# Patient Record
Sex: Female | Born: 1950
Health system: Southern US, Community
[De-identification: ages and names within clinical notes are randomized; demographics above are authoritative.]

## PROBLEM LIST (undated history)

## (undated) DIAGNOSIS — B001 Herpesviral vesicular dermatitis: Secondary | ICD-10-CM

## (undated) DIAGNOSIS — C44310 Basal cell carcinoma of skin of unspecified parts of face: Secondary | ICD-10-CM

## (undated) DIAGNOSIS — C4492 Squamous cell carcinoma of skin, unspecified: Secondary | ICD-10-CM

## (undated) DIAGNOSIS — E785 Hyperlipidemia, unspecified: Secondary | ICD-10-CM

## (undated) DIAGNOSIS — M1990 Unspecified osteoarthritis, unspecified site: Secondary | ICD-10-CM

## (undated) DIAGNOSIS — C439 Malignant melanoma of skin, unspecified: Secondary | ICD-10-CM

## (undated) HISTORY — PX: APPENDECTOMY: SHX54

## (undated) HISTORY — DX: Squamous cell carcinoma of skin, unspecified: C44.92

## (undated) HISTORY — DX: Herpesviral vesicular dermatitis: B00.1

## (undated) HISTORY — DX: Basal cell carcinoma of skin of unspecified parts of face: C44.310

## (undated) HISTORY — DX: Hyperlipidemia, unspecified: E78.5

## (undated) HISTORY — PX: REFRACTIVE SURGERY: SHX103

---

## 2001-01-10 ENCOUNTER — Ambulatory Visit (HOSPITAL_COMMUNITY): Admission: RE | Admit: 2001-01-10 | Discharge: 2001-01-10 | Payer: Self-pay | Admitting: Gastroenterology

## 2002-02-24 ENCOUNTER — Emergency Department (HOSPITAL_COMMUNITY): Admission: EM | Admit: 2002-02-24 | Discharge: 2002-02-24 | Payer: Self-pay | Admitting: Emergency Medicine

## 2002-02-24 ENCOUNTER — Encounter: Payer: Self-pay | Admitting: Emergency Medicine

## 2002-04-29 ENCOUNTER — Encounter (INDEPENDENT_AMBULATORY_CARE_PROVIDER_SITE_OTHER): Payer: Self-pay | Admitting: Specialist

## 2002-04-30 ENCOUNTER — Inpatient Hospital Stay (HOSPITAL_COMMUNITY): Admission: EM | Admit: 2002-04-30 | Discharge: 2002-05-02 | Payer: Self-pay | Admitting: *Deleted

## 2002-05-01 ENCOUNTER — Encounter: Payer: Self-pay | Admitting: General Surgery

## 2003-03-17 ENCOUNTER — Other Ambulatory Visit: Admission: RE | Admit: 2003-03-17 | Discharge: 2003-03-17 | Payer: Self-pay | Admitting: Family Medicine

## 2003-07-07 ENCOUNTER — Encounter: Admission: RE | Admit: 2003-07-07 | Discharge: 2003-07-07 | Payer: Self-pay | Admitting: Family Medicine

## 2003-09-15 ENCOUNTER — Encounter: Admission: RE | Admit: 2003-09-15 | Discharge: 2003-09-15 | Payer: Self-pay | Admitting: Neurology

## 2003-09-15 ENCOUNTER — Other Ambulatory Visit: Admission: RE | Admit: 2003-09-15 | Discharge: 2003-09-15 | Payer: Self-pay | Admitting: Obstetrics and Gynecology

## 2003-10-12 ENCOUNTER — Encounter: Admission: RE | Admit: 2003-10-12 | Discharge: 2003-10-12 | Payer: Self-pay | Admitting: Neurosurgery

## 2004-11-19 ENCOUNTER — Emergency Department (HOSPITAL_COMMUNITY): Admission: EM | Admit: 2004-11-19 | Discharge: 2004-11-19 | Payer: Self-pay | Admitting: *Deleted

## 2005-05-16 ENCOUNTER — Other Ambulatory Visit: Admission: RE | Admit: 2005-05-16 | Discharge: 2005-05-16 | Payer: Self-pay | Admitting: Family Medicine

## 2005-06-27 HISTORY — PX: CARDIOVASCULAR STRESS TEST: SHX262

## 2006-04-26 ENCOUNTER — Ambulatory Visit: Payer: Self-pay | Admitting: Family Medicine

## 2006-05-09 ENCOUNTER — Ambulatory Visit (HOSPITAL_COMMUNITY): Admission: RE | Admit: 2006-05-09 | Discharge: 2006-05-09 | Payer: Self-pay | Admitting: Gastroenterology

## 2006-05-09 ENCOUNTER — Encounter (INDEPENDENT_AMBULATORY_CARE_PROVIDER_SITE_OTHER): Payer: Self-pay | Admitting: *Deleted

## 2006-05-09 LAB — HM COLONOSCOPY

## 2007-02-14 ENCOUNTER — Ambulatory Visit: Payer: Self-pay | Admitting: Family Medicine

## 2007-04-16 ENCOUNTER — Ambulatory Visit: Payer: Self-pay | Admitting: Family Medicine

## 2007-08-07 ENCOUNTER — Ambulatory Visit: Payer: Self-pay | Admitting: Family Medicine

## 2008-10-05 ENCOUNTER — Other Ambulatory Visit: Admission: RE | Admit: 2008-10-05 | Discharge: 2008-10-05 | Payer: Self-pay | Admitting: Family Medicine

## 2008-10-05 ENCOUNTER — Ambulatory Visit: Payer: Self-pay | Admitting: Family Medicine

## 2008-10-13 ENCOUNTER — Encounter: Admission: RE | Admit: 2008-10-13 | Discharge: 2008-10-13 | Payer: Self-pay | Admitting: Family Medicine

## 2008-10-13 LAB — HM MAMMOGRAPHY: HM Mammogram: NORMAL

## 2008-10-16 ENCOUNTER — Ambulatory Visit: Payer: Self-pay | Admitting: Family Medicine

## 2009-03-24 ENCOUNTER — Ambulatory Visit: Payer: Self-pay | Admitting: Family Medicine

## 2009-11-22 ENCOUNTER — Ambulatory Visit: Payer: Self-pay | Admitting: Family Medicine

## 2009-11-22 ENCOUNTER — Other Ambulatory Visit: Admission: RE | Admit: 2009-11-22 | Discharge: 2009-11-22 | Payer: Self-pay | Admitting: Family Medicine

## 2009-11-22 LAB — HM MAMMOGRAPHY: HM Mammogram: NEGATIVE

## 2010-03-20 ENCOUNTER — Encounter: Payer: Self-pay | Admitting: Family Medicine

## 2010-07-02 ENCOUNTER — Encounter: Payer: Self-pay | Admitting: Family Medicine

## 2010-07-02 DIAGNOSIS — T2101XA Burn of unspecified degree of chest wall, initial encounter: Secondary | ICD-10-CM | POA: Insufficient documentation

## 2010-07-02 DIAGNOSIS — M21611 Bunion of right foot: Secondary | ICD-10-CM

## 2010-07-15 NOTE — Op Note (Signed)
NAME:  Lauren [name], KLUGE                ACCOUNT NO.:  [id number]   MEDICAL RECORD NO.:  [id number]          PATIENT TYPE:  AMB   LOCATION:  ENDO                         FACILITY:  [facility]   PHYSICIAN:  Anselmo Rod, M.D.  DATE OF BIRTH:  14-Jun-1950   DATE OF PROCEDURE:  05/09/2006  DATE OF DISCHARGE:                               OPERATIVE REPORT   PROCEDURE PERFORMED:  Esophagogastroduodenoscopy with gastric biopsies.   ENDOSCOPIST:  Anselmo Rod, M.D.   INSTRUMENT USED:  Pentax video panendoscope.   INDICATIONS FOR PROCEDURE:  60 year old white female with a history of  reflux undergoing EGD. The patient has a family history of stomach  cancer in several family members. Rule out peptic ulcer disease,  esophagitis, gastritis, etc.   PRE-PROCEDURE PREPARATION:  Informed consent was procured from the  patient.  The patient fasted for four hours prior to the procedure. The  risks and benefits of the procedure including bleeding, etc., were  discussed with the patient in great detail.   PRE-PROCEDURE PHYSICAL:  The patient had stable vital signs.  The neck  was supple.  Lungs were clear to auscultation.  S1 and S2 regular.  Abdomen soft with normal bowel sounds.   DESCRIPTION OF PROCEDURE:  The patient was placed in the left lateral  decubitus position and sedated with 75 mcg of Fentanyl and 7.5 mg of  Versed given intravenously in slow incremental doses.  Once the patient  was adequately sedated and maintained on low flow oxygen and continuous  cardiac monitoring, the Pentax video panendoscope was advanced through  the mouthpiece over the tongue into the esophagus under direct vision.  The entire esophagus was widely patent with no evidence of ring,  stricture, mass, esophagitis, or Barrett's mucosa.  The scope was then  advanced in the stomach.  A small amount of old heme was noted in the  mid body of the stomach.  Biopsies were done to rule out the presence of  H. pylori by  pathology.  The proximal small bowel appeared normal.  There was no obstruction.  No ulcers, erosions, masses or polyps were  identified. Mild diffuse gastritis was noted as mentioned above.  The  patient tolerated the procedure well without complications.  Retroflexion in the high cardia revealed no abnormalities.   IMPRESSION:  1. Normal appearing esophagus.  2. Gastritis in the mid body of the stomach with old heme overlying      this area, biopsies done for H. pylori.  3. Normal proximal small bowel.   RECOMMENDATIONS:  1. Await pathology results.  2. Treat with antibiotics if H. pylori present on biopsies.  3. Follow antireflux measures.  4. PPI as needed.  5. Proceed with a colonoscopy at this time, further recommendations to      be made thereafter.      Anselmo Rod, M.D.  Electronically Signed     JNM/MEDQ  D:  05/10/2006  T:  05/12/2006  Job:  [id number]   cc:   Sharlot Gowda, M.D.

## 2010-07-15 NOTE — Op Note (Signed)
NAME:  Lauren [name], Lauren [name]                ACCOUNT NO.:  [id number]   MEDICAL RECORD NO.:  [id number]          PATIENT TYPE:  AMB   LOCATION:  ENDO                         FACILITY:  [facility]   PHYSICIAN:  Anselmo Rod, M.D.  DATE OF BIRTH:  Sep 29, 1950   DATE OF PROCEDURE:  05/09/2006  DATE OF DISCHARGE:  05/09/2006                               OPERATIVE REPORT   PROCEDURE PERFORMED:  Screening colonoscopy.   ENDOSCOPIST:  Anselmo Rod, M.D.   INSTRUMENT USED:  Pentax video colonoscope.   INDICATIONS:  This 60 year old female underwent a screening colonoscopy  to rule out colonic polyps, masses, etc.   PRE-PROCEDURE PREPARATION:  An informed consent was procured from the  patient. The patient was fasted for four hours prior to the procedure  and prepped with 20 of OsmoPrep overnight over 12 hours and prepped on  the morning of the procedure.  The risks and benefits of the procedure  including a 10% miss rate of cancer and polyps were discussed with the  patient as well.   PRE-PROCEDURE PHYSICAL EXAMINATION:  VITAL SIGNS:  The patient had  stable vital signs.  NECK:  Supple.  LUNGS:  The chest was clear to auscultation.  HEART:  S1 and S2 regular.  ABDOMEN:  The abdomen is soft with normal bowel sounds.   DESCRIPTION OF PROCEDURE:  The patient was placed in the left lateral  decubitus position and sedated with another 50 mcg of fentanyl and 5 mg  of Versed given intravenously in slow incremental doses. Once the  patient was adequately sedated and maintained on low-flow oxygen and  continuous cardiac monitoring, the Pentax video colonoscope was advanced  from the rectum to the cecum.  The appendiceal orifice and ileocecal  valve were clearly visualized and photographed. The terminal ileum  appeared healthy and without lesions.  No masses or polyps, erosions or  ulcerations were seen.  A few sigmoid diverticula were present.  Retroflexion in the rectum revealed no abnormalities.   The patient  tolerated the procedure well without complications.   IMPRESSION:  Normal colonoscopy of the terminal ileum except for a few  sigmoid diverticula.  No masses or polyps seen.   RECOMMENDATIONS:  1. Continue a high-fiber diet with liberal fluid intake.  2. Brochures on diverticulosis have been given to the patient for      education.  3. Repeat colonoscopy in the next 10 years.  If the patient has any      normal symptoms in the interim, she is to contact the office      immediately for further recommendations.      Anselmo Rod, M.D.  Electronically Signed     JNM/MEDQ  D:  05/10/2006  T:  05/12/2006  Job:  [id number]   cc:   Sharlot Gowda, M.D.

## 2010-07-15 NOTE — Op Note (Signed)
[facility]  Patient:    Lauren [name], Lauren [name] Visit Number: [id number] MRN: [id number]          Service Type: END Location: ENDO Attending Physician:  Charna Elizabeth Dictated by:   Anselmo Rod, M.D. Proc. Date: 01/10/01 Admit Date:  01/10/2001   CC:         Heather Roberts, M.D.   Operative Report  DATE OF BIRTH:  1950-11-11  REFERRING PHYSICIAN:  Heather Roberts, M.D.  PROCEDURE PERFORMED:  Flexible sigmoidoscopy up to the hepatic flexure.  ENDOSCOPIST:  Anselmo Rod, M.D.  INSTRUMENT USED:  Olympus video colonoscope.  INDICATIONS FOR PROCEDURE:  Screening flexible sigmoidoscopy being performed in a 60 year old white female, rule out colonic polyps.  PREPROCEDURE PREPARATION:  Informed consent was procured from the patient. The patient was fasted for eight hours prior to the procedure and prepped with a bottle of Fleets Phospho-Soda the night prior to the procedure.  PREPROCEDURE PHYSICAL:  The patient had stable vital signs.  Neck supple. Chest clear to auscultation.  S1, S2 regular.  Abdomen soft with normal abdominal bowel sounds.  DESCRIPTION OF PROCEDURE:  The patient was placed in the left lateral decubitus position.  Once the patient was adequately positioned, the Olympus video colonoscope was advanced from the rectum to 100 cm without difficulty. The entire colonic mucosa appeared healthy except for small internal and external hemorrhoid.  No other abnormalities were noted.  No diverticulosis or polyps were present.  IMPRESSION: 1. Healthy-appearing colon up to 100 cm. 2. Small nonbleeding internal and external hemorrhoids.  RECOMMENDATIONS:  Repeat colorectal screening is recommended in the next five years unless  the patient were to develop any abnormal symptoms in the interimDictated by:   Anselmo Rod, M.D. Attending Physician:  Charna Elizabeth DD:  01/10/01 TD:  01/10/01 Job: [id number]

## 2010-07-15 NOTE — Op Note (Signed)
NAME:  Lauren [name], Lauren [name] NO.:  [id number]   MEDICAL RECORD NO.:  [id number]                   PATIENT TYPE:  OBV   LOCATION:  5702                                 FACILITY:  [facility]   PHYSICIAN:  Anselm Pancoast. Zachery Dakins, M.D.          DATE OF BIRTH:  [birth year]   DATE OF PROCEDURE:  04/29/2002  DATE OF DISCHARGE:                                 OPERATIVE REPORT   PREOPERATIVE DIAGNOSIS:  Acute appendicitis.   POSTOPERATIVE DIAGNOSIS:  Acute appendicitis.   OPERATION:  Laparoscopic appendectomy.   ANESTHESIA:  General.   SURGEON:  Anselm Pancoast. Zachery Dakins, M.D.   ASSISTANT:  Nurse.   HISTORY:  The patient is a 60 year old female who has had a progressive,  about 48 hr, history of abdominal pain; originally kind of gaseous and  bloated in the upper abdomen.  The pain then shifted more to the right lower  quadrant over the past 12 hr.  She was originally seen by Dr. Luan Pulling at  the ER physcian's office with appendicitis.  Her white count is about  13,000.  Because of the family problems,  Dr. Luan Pulling asked if I would see her and manage her.   On physical examination I was definitely in agreement that there was  definite tenderness in  right lower quadrant, fever, symptoms are  progressive like appendicitis.  I felt that a laparoscopic appendectomy was  appropriate.   She has had two previous cesarean sections through a lower midline incision,  and hopefully will not have too many adhesions.  The patient had been given  2 g of Unasyn approximately two hours earlier.   DESCRIPTION OF PROCEDURE:  The patient was taken to the operative suite,  general anesthesia given with endotracheal tube and an oral tube into the  stomach, and then a sterile Foley catheter inserted.  The abdomen was  prepped with Betadine soapy scrub solution and draped in a sterile manner.   A small incision was made at the umbilicus, just above her lower midline  cesarean  section incisions; then carefully entered into the peritoneal  cavity.  The peritoneum was carefully opened.  Fortunately, she did not have  any adhesions to the undersurface.  The Hasson cannula was introduced after  a pursestring of 0 Vicryl.  The upper 5 mm trocar was placed first, and then  using the sector through the upper you could see there was obviously an  inflammatory process in the right lower quadrant.  However, I could not  really see the definite appendix.   I then went ahead and placed a 12 mm trocar in the left lower quadrant, and  then switched in the camera from the umbilical to the left lower quadrant.  I could flip up a very thick edematous omentum, and then identified a very  long acutely inflamed appendix.  I could grasp it with the upper and then  using  the harmonic scalpel freed up this thick, edematous omentum from the  appendix.  I then took a GIA and basically transected the appendix as it  junctioned with the cecum.   Next, using the harmonic scalpel, I went ahead and divided this very  thickened appendiceal mesentery.  Good hemostasis was obtained with the  harmonic scalpel.   There was a second little area of fatty tissue that looked frankly necrotic,  that had been right into the appendix.  I actually removed this also.   I placed the appendix in an Endo catch bag, brought it out through the  fascia at the umbilicus.  Next, we thoroughly irrigated and switched the  omentum back (basically when we had flipped it up) to the normal anatomical  position; then aspirated the remaining irrigating fluid.  The upper 5 mm  trocar was removed first, then the left lower quadrant, and then after  carbon dioxide released the umbilical fascia.  The umbilical fascia was  closed with two figure-of-eights of 0 Vicryl.  I closed the anterior fascia  in the left lower quadrant at the 12 mm trocar site.  I did not anesthetize  the incision, but 4-0 Vicryl subcuticular  sutures and then Benzoin and Steri-  Strips on the skin.   The patient tolerated the procedure nicely and hopefully will be ready to go  home in the a.m.  I am going to keep her on about two or three additional  courses of Unasyn because of the marked inflammation of the appendix, but it  had not truly ruptured.                                               Anselm Pancoast. Zachery Dakins, M.D.    WJW/MEDQ  D:  04/29/2002  T:  04/29/2002  Job:  [id number]

## 2010-07-15 NOTE — Discharge Summary (Signed)
NAME:  Lauren [name], Lauren [name] NO.:  [id number]   MEDICAL RECORD NO.:  [id number]                   PATIENT TYPE:  INP   LOCATION:  5709                                 FACILITY:  [facility]   PHYSICIAN:  Anselm Pancoast. Zachery Dakins, M.D.          DATE OF BIRTH:  March 13, 1950   DATE OF ADMISSION:  04/29/2002  DATE OF DISCHARGE:  05/02/2002                                 DISCHARGE SUMMARY   DISCHARGE DIAGNOSIS:  Acute appendicitis with localized peritonitis.   OPERATION:  Laparoscopic appendectomy.   HISTORY:  [name] is a 60 year old Caucasian female who was seen in  the emergency room (actually seen by Dr. Luan Pulling who was on call), and  the patient had a two-day history of progressive abdominal pain, originally  gaseous, not localized for the first 24 hours, and then became worse more in  the right lower quadrant.  She was nauseated, but no vomiting, and her  previous abdominal surgery had been two C-sections, with the last one  approximately 28 years ago.  On examination, she definitely had a  temperature of 101.9, pulse was elevated at 112, respirations 18, and blood  pressure 128/70, and she was definitely tender with rebound in the right  lower quadrant.  Dr. Luan Pulling was busy at that time and asked if I could  do her surgery, and I was in agreement with the diagnosis.  Her white count  was only about 14,000.  Urinalysis was negative.  We did not obtain a CT,  and she was taken to surgery.  First she was given 3 gm of Unasyn, and that  had been received about two hours prior to surgery.  After the surgery, she  was noted to have a markedly inflamed appendix with localized peritonitis.  The appendix was in kind of a midline position, and it was difficult to  truly mobilize.  I was freely able finally to completely free it up and  remove the appendix, and then thoroughly irrigated the lower abdomen and  aspirated this irrigated fluid.  The morning after  surgery the patient was  up walking.  We continued on the Unasyn, and I thought that she was possibly  ready for discharge, but later that afternoon she appeared to be more  tender.  Her white count had elevated to 18,000, and because of the  increasing pain and the difficulty that I had in removing the appendix with  the laparoscope, I questioned whether something could be wrong and obtained  a CT postoperatively.  This showed areas of inflammation in the lower  abdomen consistent with her appendicitis, but no evidence of any abscess or  other abnormality, and we continued with the antibiotics.  I think I did add  Flagyl, and the following morning she was definitely better.  The pain was  decreased.  A repeat white count that had been greater than 18,000 was now  9600, and her temperature was only a maximum of approximately 101 that day.  Her pulse was decreasing.  The following day her white count was down to  8800, and she was obviously improving.   She was ready for discharge on 07/02/02, which was the third postoperative  day, but is discharged on Augmentin 875 b.i.d. for four days, Vicodin for  pain, and will see me for a follow up exam in the office in approximately 3-  4 days.   I do not have a good explanation for the increasing in pain 48 hours after  surgery.  She did not appear to be having obviously atelectasis, and her  abdominal pain was consistent as if she was a little slow in clearing the  peritonitis from this markedly inflamed appendix.  Her appendix showed an  acute inflamed appendix; not an obvious rupture, but there was a significant  inflammation in the peritoneal cavity surrounding the appendix as if she was  developing a generalized peritonitis.                                               Anselm Pancoast. Zachery Dakins, M.D.    WJW/MEDQ  D:  07/02/2002  T:  07/03/2002  Job:  [id number]

## 2010-10-04 ENCOUNTER — Emergency Department (HOSPITAL_COMMUNITY)
Admission: EM | Admit: 2010-10-04 | Discharge: 2010-10-04 | Disposition: A | Discharge status: Home / Self Care | Payer: BC Managed Care – PPO | Attending: Emergency Medicine | Admitting: Emergency Medicine

## 2010-10-04 DIAGNOSIS — H5789 Other specified disorders of eye and adnexa: Secondary | ICD-10-CM | POA: Insufficient documentation

## 2010-10-04 DIAGNOSIS — H109 Unspecified conjunctivitis: Secondary | ICD-10-CM | POA: Insufficient documentation

## 2010-11-15 ENCOUNTER — Telehealth: Payer: Self-pay | Admitting: Family Medicine

## 2010-11-16 ENCOUNTER — Other Ambulatory Visit: Payer: Self-pay | Admitting: *Deleted

## 2010-11-16 MED ORDER — ATORVASTATIN CALCIUM 20 MG PO TABS: 20.0000 mg | ORAL_TABLET | Freq: Every day | ORAL | Status: DC

## 2010-11-22 NOTE — Telephone Encounter (Signed)
DONE

## 2010-11-24 ENCOUNTER — Other Ambulatory Visit (HOSPITAL_COMMUNITY)
Admission: RE | Admit: 2010-11-24 | Discharge: 2010-11-24 | Disposition: A | Discharge status: Home / Self Care | Payer: BC Managed Care – PPO | Source: Ambulatory Visit | Attending: Family Medicine | Admitting: Family Medicine

## 2010-11-24 ENCOUNTER — Telehealth: Payer: Self-pay | Admitting: *Deleted

## 2010-11-24 ENCOUNTER — Ambulatory Visit (INDEPENDENT_AMBULATORY_CARE_PROVIDER_SITE_OTHER): Payer: BC Managed Care – PPO | Admitting: Family Medicine

## 2010-11-24 ENCOUNTER — Encounter: Payer: Self-pay | Admitting: Family Medicine

## 2010-11-24 VITALS — BP 110/70 | HR 68 | Temp 98.1°F | Ht 61.0 in | Wt 163.0 lb

## 2010-11-24 DIAGNOSIS — Z0000 Encounter for general adult medical examination without abnormal findings: Secondary | ICD-10-CM

## 2010-11-24 DIAGNOSIS — Z01419 Encounter for gynecological examination (general) (routine) without abnormal findings: Secondary | ICD-10-CM | POA: Insufficient documentation

## 2010-11-24 DIAGNOSIS — B001 Herpesviral vesicular dermatitis: Secondary | ICD-10-CM

## 2010-11-24 DIAGNOSIS — B009 Herpesviral infection, unspecified: Secondary | ICD-10-CM

## 2010-11-24 DIAGNOSIS — Z79899 Other long term (current) drug therapy: Secondary | ICD-10-CM

## 2010-11-24 DIAGNOSIS — E7800 Pure hypercholesterolemia, unspecified: Secondary | ICD-10-CM

## 2010-11-24 DIAGNOSIS — Z23 Encounter for immunization: Secondary | ICD-10-CM

## 2010-11-24 DIAGNOSIS — Z2911 Encounter for prophylactic immunotherapy for respiratory syncytial virus (RSV): Secondary | ICD-10-CM

## 2010-11-24 LAB — COMPREHENSIVE METABOLIC PANEL
ALT: 14 U/L (ref 0–35)
AST: 10 U/L (ref 0–37)
Albumin: 4.4 g/dL (ref 3.5–5.2)
Alkaline Phosphatase: 64 U/L (ref 39–117)
BUN: 10 mg/dL (ref 6–23)
Chloride: 102 mEq/L (ref 96–112)
Creat: 0.74 mg/dL (ref 0.50–1.10)
Glucose, Bld: 87 mg/dL (ref 70–99)
Total Protein: 7.1 g/dL (ref 6.0–8.3)

## 2010-11-24 LAB — CBC WITH DIFFERENTIAL/PLATELET
Basophils Absolute: 0 10*3/uL (ref 0.0–0.1)
Basophils Relative: 0 % (ref 0–1)
Eosinophils Absolute: 0.2 10*3/uL (ref 0.0–0.7)
Eosinophils Relative: 3 % (ref 0–5)
MCH: 29 pg (ref 26.0–34.0)
MCV: 89.7 fL (ref 78.0–100.0)
Monocytes Absolute: 0.6 10*3/uL (ref 0.1–1.0)
Neutro Abs: 5 10*3/uL (ref 1.7–7.7)
Platelets: 328 10*3/uL (ref 150–400)
RBC: 4.76 MIL/uL (ref 3.87–5.11)

## 2010-11-24 LAB — POCT URINALYSIS DIPSTICK
Bilirubin, UA: NEGATIVE
Glucose, UA: NEGATIVE
Ketones, UA: NEGATIVE
Protein, UA: NEGATIVE
Spec Grav, UA: 1.005

## 2010-11-24 LAB — HM PAP SMEAR: HM Pap smear: NORMAL

## 2010-11-24 LAB — LIPID PANEL
LDL Cholesterol: 99 mg/dL (ref 0–99)
Total CHOL/HDL Ratio: 2.7 Ratio
Triglycerides: 76 mg/dL (ref <150)
VLDL: 15 mg/dL (ref 0–40)

## 2010-11-24 MED ORDER — ATORVASTATIN CALCIUM 20 MG PO TABS: 20.0000 mg | ORAL_TABLET | Freq: Every day | ORAL | Status: DC

## 2010-11-24 MED ORDER — VALACYCLOVIR HCL 1 G PO TABS: 2000.0000 mg | ORAL_TABLET | Freq: Two times a day (BID) | ORAL | Status: AC

## 2010-11-24 NOTE — Telephone Encounter (Signed)
Lauren [name] (seen this am for CPE) came back by around 11:30 am stating that she forgot to tell you about her head congestion. She says that she has had sever head congestion/HA for 2-3 days, no cough, no ST. Would like something called into [facility] Thanks.

## 2010-11-24 NOTE — Telephone Encounter (Signed)
Advise pt--no evidence of bacterial infection on exam.  I recommend use of decongestant (ie sudafed) and Mucinex if secretions are thick to loosen them up.  Sinus rinses or Neti-pot is also helpful for relieving sinus congestion.  These medications are all OTC

## 2010-11-24 NOTE — Progress Notes (Signed)
Lauren [name] is a 60 y.o. female who presents for a complete physical.  She has the following concerns: Notes decreased libido.  She thinks it is related to stress, especially with her current relationship. Gets cold sores periodically, especially when at the beach.  Previously has taken Valtrex and would like a prescription. Congestion--having some sinus headaches and congestion.  Denies fevers, purulence, cough; has had for a couple of days  Immunization History  Administered Date(s) Administered  . Pneumococcal Polysaccharide 10/05/2008  . Td 02/28/2000  . Tdap 10/05/2008   Last Pap smear: 10/2009 Last mammogram: 2010 Last colonoscopy: 3/08 Last DEXA: pt states she had one at [facility] in '-08 or '09.  No record in chart.  Normal per pt Dentist: twice yearly Ophtho: many years ago (>5) Exercise: minimal  Past Medical History  Diagnosis Date  . Hyperlipidemia   . Facial basal cell cancer     nose (Dr. Nicholas Lose)    Past Surgical History  Procedure Date  . Cardiovascular stress test 06-2005    Dr. Clarene Duke  . Cesarean section x2  . Appendectomy     History   Social History  . Marital Status: Divorced    Spouse Name: N/A    Number of Children: 2  . Years of Education: N/A   Occupational History  . used [employer]    Social History Main Topics  . Smoking status: Never Smoker   . Smokeless tobacco: Never Used  . Alcohol Use: Yes     1 drink every 6 months.  . Drug Use: No  . Sexually Active: Yes -- Female partner(s)   Other Topics Concern  . Not on file   Social History Narrative   Lives with fiance, yorkie. Both sons live in [place]    Family History  Problem Relation Age of Onset  . Heart disease Mother 49    MI  . Heart disease Sister   . Heart disease Sister   . Hyperlipidemia Sister   . Hypertension Sister   . Diabetes Sister   . Graves' disease Sister   . COPD Sister   . Heart disease Brother     81's  . Heart disease Brother   . Diabetes Brother    borderline  . COPD Brother     Current outpatient prescriptions:aspirin 81 MG tablet, Take 81 mg by mouth daily.  , Disp: , Rfl: ;  atorvastatin (LIPITOR) 20 MG tablet, Take 1 tablet (20 mg total) by mouth daily., Disp: 90 tablet, Rfl: 1;  DISCONTD: atorvastatin (LIPITOR) 20 MG tablet, Take 1 tablet (20 mg total) by mouth daily., Disp: 30 tablet, Rfl: 0 valACYclovir (VALTREX) 1000 MG tablet, Take 2 tablets (2,000 mg total) by mouth 2 (two) times daily. For just one day (4 tablets per episode), Disp: 20 tablet, Rfl: 0  No Known Allergies  ROS: The patient denies anorexia, fever, weight changes, headaches,  vision changes, decreased hearing, ear pain, sore throat, breast concerns, chest pain, palpitations, dizziness, syncope, dyspnea on exertion, cough, swelling, nausea, vomiting, diarrhea, constipation, abdominal pain, melena, hematochezia, indigestion/heartburn, hematuria, incontinence, dysuria, vaginal bleeding, discharge, odor or itch, genital lesions, joint pains, numbness, tingling, weakness, tremor, suspicious skin lesions, depression, anxiety, abnormal bleeding/bruising, or enlarged lymph nodes. +sinus headache and congestion for a few days, + decreased libido  PHYSICAL EXAM: BP 110/70  Pulse 68  Temp(Src) 98.1 F (36.7 C) (Oral)  Ht 5\' 1"  (1.549 m)  Wt 163 lb (73.936 kg)  BMI 30.80 kg/m2  General Appearance:  Alert, cooperative, no distress, appears stated age  Head:    Normocephalic, without obvious abnormality, atraumatic  Eyes:    PERRL, conjunctiva/corneas clear, EOM's intact, fundi    benign  Ears:    Normal TM's and external ear canals  Nose:   Nares normal, mucosa normal, no drainage or sinus   tenderness  Throat:   Lips, mucosa, and tongue normal; teeth and gums normal  Neck:   Supple, no lymphadenopathy;  thyroid:  no   enlargement/tenderness/nodules; no carotid   bruit or JVD  Back:    Spine nontender, no curvature, ROM normal, no CVA     tenderness  Lungs:     Clear  to auscultation bilaterally without wheezes, rales or     ronchi; respirations unlabored  Chest Wall:    No tenderness or deformity   Heart:    Regular rate and rhythm, S1 and S2 normal, no murmur, rub   or gallop  Breast Exam:    No tenderness, masses, or nipple discharge or inversion.      No axillary lymphadenopathy  Abdomen:     Soft, non-tender, nondistended, normoactive bowel sounds,    no masses, no hepatosplenomegaly  Genitalia:    Normal external genitalia without lesions.  BUS and vagina normal; cervix without lesions, or cervical motion tenderness. No abnormal vaginal discharge.  Uterus and adnexa not enlarged, nontender, no masses.  Pap performed  Rectal:    Normal tone, no masses or tenderness; guaiac negative stool  Extremities:   No clubbing, cyanosis or edema  Pulses:   2+ and symmetric all extremities  Skin:   Skin color, texture, turgor normal, no rashes or lesions. AK R shin  Lymph nodes:   Cervical, supraclavicular, and axillary nodes normal  Neurologic:   CNII-XII intact, normal strength, sensation and gait; reflexes 2+ and symmetric throughout          Psych:   Normal mood, affect, hygiene and grooming.    ASSESSMENT/PLAN: 1. Routine general medical examination at a health care facility  POCT Urinalysis Dipstick, Visual acuity screening, Vitamin D 25 hydroxy  2. Pure hypercholesterolemia  Lipid panel, atorvastatin (LIPITOR) 20 MG tablet  3. Encounter for long-term (current) use of other medications  Comprehensive metabolic panel, CBC with Differential  4. Need for zoster vaccination  Varicella-zoster vaccine subcutaneous  5. Need for influenza vaccination  Flu vaccine greater than or equal to 3yo preservative free IM  6. Herpes labialis  valACYclovir (VALTREX) 1000 MG tablet   F/u with dermatologist to have AK treated RLE and routine f/u (sees Dr. Nicholas Lose)  Discussed monthly self breast exams and yearly mammograms after the age of 47; at least 30 minutes of aerobic  activity at least 5 days/week; proper sunscreen use reviewed; healthy diet, including goals of calcium and vitamin D intake and alcohol recommendations (less than or equal to 1 drink/day) reviewed; regular seatbelt use; changing batteries in smoke detectors.  Immunization recommendations discussed--Zostavax and flu shot given today.  Colonoscopy recommendations reviewed--UTD

## 2010-11-24 NOTE — Patient Instructions (Addendum)
HEALTH MAINTENANCE RECOMMENDATIONS:  It is recommended that you get at least 30 minutes of aerobic exercise at least 5 days/week (for weight loss, you may need as much as 60-90 minutes). This can be any activity that gets your heart rate up. This can be divided in 10-15 minute intervals if needed, but try and build up your endurance at least once a week.  Weight bearing exercise is also recommended twice weekly.  Eat a healthy diet with lots of vegetables, fruits and fiber.  "Colorful" foods have a lot of vitamins (ie green vegetables, tomatoes, red peppers, etc).  Limit sweet tea, regular sodas and alcoholic beverages, all of which has a lot of calories and sugar.  Up to 1 alcoholic drink daily may be beneficial for women (unless trying to lose weight, watch sugars).  Drink a lot of water.  Calcium recommendations are 1200-1500 mg daily (1500 mg for postmenopausal women or women without ovaries), and vitamin D 1000 IU daily.  This should be obtained from diet and/or supplements (vitamins), and calcium should not be taken all at once, but in divided doses.  Monthly self breast exams and yearly mammograms for women over the age of 80 is recommended.  Sunscreen of at least SPF 30 should be used on all sun-exposed parts of the skin when outside between the hours of 10 am and 4 pm (not just when at beach or pool, but even with exercise, golf, tennis, and yard work!)  Use a sunscreen that says "broad spectrum" so it covers both UVA and UVB rays, and make sure to reapply every 1-2 hours.  Remember to change the batteries in your smoke detectors when changing your clock times in the spring and fall.  Use your seat belt every time you are in a car, and please drive safely and not be distracted with cell phones and texting while driving.  Please call to schedule a routine eye exam. Please take your Lipitor every day, and follow a low cholesterol diet

## 2010-11-25 ENCOUNTER — Encounter: Payer: Self-pay | Admitting: Family Medicine

## 2010-11-25 ENCOUNTER — Telehealth: Payer: Self-pay | Admitting: Family Medicine

## 2010-11-25 DIAGNOSIS — Z0000 Encounter for general adult medical examination without abnormal findings: Secondary | ICD-10-CM

## 2010-11-25 LAB — VITAMIN D 25 HYDROXY (VIT D DEFICIENCY, FRACTURES): Vit D, 25-Hydroxy: 55 ng/mL (ref 30–89)

## 2010-11-25 NOTE — Telephone Encounter (Signed)
done

## 2010-12-02 NOTE — Progress Notes (Signed)
Addended by: Lavell Islam on: 12/02/2010 01:58 PM   Modules accepted: Orders

## 2010-12-08 ENCOUNTER — Encounter: Payer: Self-pay | Admitting: Family Medicine

## 2010-12-23 ENCOUNTER — Encounter: Payer: Self-pay | Admitting: Family Medicine

## 2011-01-27 ENCOUNTER — Other Ambulatory Visit: Payer: Self-pay | Admitting: Dermatology

## 2011-04-07 ENCOUNTER — Other Ambulatory Visit: Payer: Self-pay | Admitting: Dermatology

## 2011-12-24 ENCOUNTER — Other Ambulatory Visit: Payer: Self-pay | Admitting: Family Medicine

## 2012-02-09 ENCOUNTER — Other Ambulatory Visit: Payer: Self-pay | Admitting: Dermatology

## 2012-09-10 ENCOUNTER — Encounter: Payer: Self-pay | Admitting: Internal Medicine

## 2012-09-10 LAB — HM MAMMOGRAPHY

## 2013-01-19 ENCOUNTER — Other Ambulatory Visit: Payer: Self-pay | Admitting: Family Medicine

## 2013-01-22 ENCOUNTER — Other Ambulatory Visit: Payer: Self-pay | Admitting: Family Medicine

## 2013-07-07 ENCOUNTER — Encounter: Payer: Self-pay | Admitting: Family Medicine

## 2013-07-07 ENCOUNTER — Ambulatory Visit
Admission: RE | Admit: 2013-07-07 | Discharge: 2013-07-07 | Disposition: A | Discharge status: Home / Self Care | Payer: BC Managed Care – PPO | Source: Ambulatory Visit | Attending: Family Medicine | Admitting: Family Medicine

## 2013-07-07 ENCOUNTER — Ambulatory Visit (INDEPENDENT_AMBULATORY_CARE_PROVIDER_SITE_OTHER): Payer: BC Managed Care – PPO | Admitting: Family Medicine

## 2013-07-07 VITALS — BP 110/72 | HR 80 | Temp 98.7°F | Ht 60.0 in | Wt 177.0 lb

## 2013-07-07 DIAGNOSIS — M25561 Pain in right knee: Secondary | ICD-10-CM

## 2013-07-07 DIAGNOSIS — B001 Herpesviral vesicular dermatitis: Secondary | ICD-10-CM

## 2013-07-07 DIAGNOSIS — E7800 Pure hypercholesterolemia, unspecified: Secondary | ICD-10-CM

## 2013-07-07 DIAGNOSIS — B009 Herpesviral infection, unspecified: Secondary | ICD-10-CM

## 2013-07-07 DIAGNOSIS — M25569 Pain in unspecified knee: Secondary | ICD-10-CM

## 2013-07-07 MED ORDER — NAPROXEN 500 MG PO TABS: 500.0000 mg | ORAL_TABLET | Freq: Two times a day (BID) | ORAL | Status: DC

## 2013-07-07 MED ORDER — VALACYCLOVIR HCL 500 MG PO TABS: ORAL_TABLET | ORAL | Status: DC

## 2013-07-07 NOTE — Progress Notes (Signed)
Chief Complaint  Patient presents with  . Knee Pain    has been having right knee pain x 1 month. Did fall down front steps about a month ago. Also if you have time would like something for her sinuses. Has been taking zyrtec wthout any relief, also tried claritin-D. Has facial pain and pressure and sinus HA x 3 weeks-has been doing lots of outside work.    Complains of right knee pain for over a month, probably closer to 3 months.  Pain started after she fell down 4 stone steps.  She landed flat on both knees.  The skin on the right knee was cut (didn't require stitches).  She broke a toe on her left foot at the same time. She swelled R>L.  Swelling has improved.  She has sharp medial knee pain if she twists, sometimes feels like it will buckle.  The pain radiates up to the medial right thigh when she twists like that.  She has taken ibuprofen, but sporadically, not regularly.  No side effects.  She hasn't been seen here since 2012.  She hasn't taken lipid-lowering meds in years.  She is asking for a refill of Valtrex--she used to take it daily to prevent frequent cold sores, but hasn't been taking it since she ran out, because she hadn't been seen.  She is also complaining of allergies--runny nose, sniffling, and some sinus pressure, not improved with OTC zyrtec. Denies purulence, fevers.  Past Medical History  Diagnosis Date  . Hyperlipidemia   . Facial basal cell cancer     nose (Dr. Ubaldo Glassing)   Past Surgical History  Procedure Laterality Date  . Cardiovascular stress test  06-2005    Dr. Rex Kras  . Cesarean section  x2  . Appendectomy     History   Social History  . Marital Status: Divorced    Spouse Name: N/A    Number of Children: 2  . Years of Education: N/A   Occupational History  . used Teacher, early years/pre    Social History Main Topics  . Smoking status: Never Smoker   . Smokeless tobacco: Never Used  . Alcohol Use: Yes     Comment: 1 drink every 6 months.  . Drug Use: No  .  Sexual Activity: Yes    Partners: Male   Other Topics Concern  . Not on file   Social History Narrative   Lives alone, [place]. Both sons live in [facility]   Outpatient Encounter Prescriptions as of 07/07/2013  Medication Sig Note  . aspirin 81 MG tablet Take 81 mg by mouth daily.   07/07/2013: Takes it most days  . cetirizine (ZYRTEC) 10 MG tablet Take 10 mg by mouth daily.   . valACYclovir (VALTREX) 500 MG tablet TAKE 1 TABLET EVERY DAY 07/07/2013: Uses prn fever blisters  . [DISCONTINUED] atorvastatin (LIPITOR) 20 MG tablet Take 1 tablet (20 mg total) by mouth daily. 07/07/2013: She hasn't taken in a couple of years, made her bones aches   Today took a BC powder and zyrtec.  No Known Allergies  ROS:  Denies fevers, chills, cough, shortness of breath, sore throat, chest pain, palpitations.  Denies nausea, vomiting, abdominal pain, bowel changes.  +R knee pain. No bleeding/bruising/rash or other complaints except as noted in HPI  PHYSICAL EXAM:  BP 110/72  Pulse 80  Temp(Src) 98.7 F (37.1 C) (Oral)  Ht 5' (1.524 m)  Wt 177 lb (80.287 kg)  BMI 34.57 kg/m2 Well developed, pleasant female in no  distress.  She is sniffling frequently throughout the visit Lower extremities:  FROM of knees without crepitus.  L knee exam is completely normal.  R knee--her pain is medial, and she has some mild scattered tenderness medially. No soft tissue swelling, warmth or effusion.  Pain radiates up medial thigh with twisting. Negative lachman, mcMurray, flick test. No pain with valgus/varus stress.  No effusion  ASSESSMENT/PLAN:  Right knee pain - suspect MCL strain; no internal derangement.  can't r/o arthritis.  given trauma, check x-ray - Plan: naproxen (NAPROSYN) 500 MG tablet, DG Knee Complete 4 Views Right  Pure hypercholesterolemia - not on meds.  past due for fasting lipids  Herpes labialis - refill valtrex just x 1 month, until she can get back for med check to discuss in more detail.   Take  the naproxen twice daily with food until completely resolved (or for the full 2 weeks) Follow up in 2 weeks if ongoing knee pain. Go to [facility] today for x-ray.  Zyrtec, decongestant such as sudafed if needed. Consider mucinex if mucus is thick, causing cough or chest congestion.   Return in 2 weeks, fasting, for follow-up and med check

## 2013-07-07 NOTE — Patient Instructions (Signed)
Take the naproxen twice daily with food until completely resolved (or for the full 2 weeks) Follow up in 2 weeks if ongoing knee pain. Go to [facility] today for x-ray.   Zyrtec, decongestant such as sudafed if needed. Consider mucinex if mucus is thick, causing cough or chest congestion.   Return in 2 weeks, fasting, for follow-up and med check

## 2013-07-14 ENCOUNTER — Ambulatory Visit (INDEPENDENT_AMBULATORY_CARE_PROVIDER_SITE_OTHER): Payer: BC Managed Care – PPO | Admitting: Family Medicine

## 2013-07-14 DIAGNOSIS — Z5320 Procedure and treatment not carried out because of patient's decision for unspecified reasons: Secondary | ICD-10-CM

## 2013-07-15 NOTE — Progress Notes (Signed)
Patient ended up cancelling her appointment; she was not seen today

## 2013-07-19 ENCOUNTER — Other Ambulatory Visit: Payer: Self-pay | Admitting: Family Medicine

## 2013-07-22 NOTE — Telephone Encounter (Signed)
Patient is aware that she will need to follow up with Dr. Tomi Bamberger in order to receive refills on the medication. CLS

## 2013-07-22 NOTE — Telephone Encounter (Signed)
Done

## 2013-07-22 NOTE — Telephone Encounter (Signed)
No.  She was to follow up in 2 weeks if she had ongoing knee pain.  She can use OTC Aleve in the interim.  (She had scheduled a visit 1 week after she was seen, for a cortisone shot, but left/canceled her appt prior to being seen)

## 2013-07-22 NOTE — Telephone Encounter (Signed)
Is this okay to refill? 

## 2013-07-28 ENCOUNTER — Ambulatory Visit (INDEPENDENT_AMBULATORY_CARE_PROVIDER_SITE_OTHER): Payer: BC Managed Care – PPO | Admitting: Family Medicine

## 2013-07-28 ENCOUNTER — Other Ambulatory Visit: Payer: Self-pay | Admitting: Family Medicine

## 2013-07-28 ENCOUNTER — Encounter: Payer: Self-pay | Admitting: Family Medicine

## 2013-07-28 VITALS — BP 108/70 | HR 84 | Ht 60.0 in | Wt 177.0 lb

## 2013-07-28 DIAGNOSIS — M7061 Trochanteric bursitis, right hip: Secondary | ICD-10-CM

## 2013-07-28 DIAGNOSIS — M25561 Pain in right knee: Secondary | ICD-10-CM

## 2013-07-28 DIAGNOSIS — B001 Herpesviral vesicular dermatitis: Secondary | ICD-10-CM

## 2013-07-28 DIAGNOSIS — B009 Herpesviral infection, unspecified: Secondary | ICD-10-CM

## 2013-07-28 DIAGNOSIS — M76899 Other specified enthesopathies of unspecified lower limb, excluding foot: Secondary | ICD-10-CM

## 2013-07-28 DIAGNOSIS — E7800 Pure hypercholesterolemia, unspecified: Secondary | ICD-10-CM

## 2013-07-28 DIAGNOSIS — Z0000 Encounter for general adult medical examination without abnormal findings: Secondary | ICD-10-CM

## 2013-07-28 DIAGNOSIS — M25569 Pain in unspecified knee: Secondary | ICD-10-CM

## 2013-07-28 DIAGNOSIS — Z79899 Other long term (current) drug therapy: Secondary | ICD-10-CM

## 2013-07-28 DIAGNOSIS — Z8249 Family history of ischemic heart disease and other diseases of the circulatory system: Secondary | ICD-10-CM

## 2013-07-28 LAB — CBC WITH DIFFERENTIAL/PLATELET
BASOS PCT: 1 % (ref 0–1)
Basophils Absolute: 0 10*3/uL (ref 0.0–0.1)
EOS ABS: 0.1 10*3/uL (ref 0.0–0.7)
EOS PCT: 2 % (ref 0–5)
HEMATOCRIT: 37.6 % (ref 36.0–46.0)
Hemoglobin: 12.9 g/dL (ref 12.0–15.0)
LYMPHS ABS: 1.1 10*3/uL (ref 0.7–4.0)
Lymphocytes Relative: 27 % (ref 12–46)
MCH: 29.9 pg (ref 26.0–34.0)
MCHC: 34.3 g/dL (ref 30.0–36.0)
MCV: 87.2 fL (ref 78.0–100.0)
Monocytes Absolute: 0.3 10*3/uL (ref 0.1–1.0)
Monocytes Relative: 7 % (ref 3–12)
NEUTROS PCT: 63 % (ref 43–77)
Neutro Abs: 2.5 10*3/uL (ref 1.7–7.7)
PLATELETS: 289 10*3/uL (ref 150–400)
RBC: 4.31 MIL/uL (ref 3.87–5.11)
RDW: 13.7 % (ref 11.5–15.5)
WBC: 3.9 10*3/uL — AB (ref 4.0–10.5)

## 2013-07-28 LAB — COMPREHENSIVE METABOLIC PANEL
ALBUMIN: 4.3 g/dL (ref 3.5–5.2)
ALT: 13 U/L (ref 0–35)
AST: 9 U/L (ref 0–37)
Alkaline Phosphatase: 50 U/L (ref 39–117)
BUN: 11 mg/dL (ref 6–23)
CO2: 24 mEq/L (ref 19–32)
Calcium: 9.4 mg/dL (ref 8.4–10.5)
Chloride: 103 mEq/L (ref 96–112)
Creat: 0.65 mg/dL (ref 0.50–1.10)
Glucose, Bld: 75 mg/dL (ref 70–99)
POTASSIUM: 4.5 meq/L (ref 3.5–5.3)
SODIUM: 137 meq/L (ref 135–145)
Total Bilirubin: 0.4 mg/dL (ref 0.2–1.2)
Total Protein: 6.7 g/dL (ref 6.0–8.3)

## 2013-07-28 NOTE — Progress Notes (Signed)
Chief Complaint  Patient presents with  . Med check    fasting med check. Would also like to have a cortisone injection in her right knee.    Hyperlipidemia/family history of CAD:  She had been on Crestor and Lipitor in the past, stating that she only took each of them for about 2-3 weeks.  Crestor made her dizzy and lightheaded.  Lipitor was "too strong", so dose was lowered.  She couldn't tolerate even the lower dose due to muscles and bones aching.  She had been seeing Dr. Rex Kras, had normal stress tests.  She last saw him about 5 years ago. She had a dye scan and that everything looked okay, was being put on statin preventatively.  (paper chart not available at time of visit).  She tries to follow a low cholesterol diet.  She denies having any chest pain, dyspnea, edema or palpitations.  Lab Results  Component Value Date   CHOL 182 11/24/2010   HDL 68 11/24/2010   LDLCALC 99 11/24/2010   TRIG 76 11/24/2010   CHOLHDL 2.7 11/24/2010   She doesn't recall if she was taking any meds at this time, but didn't take anything for very long.  Right knee pain--pain is "everywhere"--sometimes laterally, sometimes throughout the whole knee. With certain turning positions (feet planted and twisting to the left), she has pain that radiates up the thigh, to the groin.  She is taking the naproxen twice daily with some improvement.  She denies any abdominal pain, bleeding/bruising.  Denies any giving way, doesn't feel unstable with walking.  Denies any significant knee swelling.  She is also having pain in her right hip; can't lie on her right side at night in the bed because it is painful. She has pain that radiates down the side of the leg that she calls "sciatica" and has apparently been fairly chronic.  Herpes labialis--if she is compliant with daily meds, doesn't have breakthrough. If she doesn't take meds, gets very severe outbreaks, with any illness or stress.  Past Medical History  Diagnosis Date  .  Hyperlipidemia   . Facial basal cell cancer     nose (Dr. Ubaldo Glassing), leg  . Squamous cell skin cancer     many sites (hands/legs)  . Herpes simplex labialis    Past Surgical History  Procedure Laterality Date  . Cardiovascular stress test  06-2005    Dr. Rex Kras  . Cesarean section  x2  . Appendectomy     History   Social History  . Marital Status: Divorced    Spouse Name: N/A    Number of Children: 2  . Years of Education: N/A   Occupational History  . used Teacher, early years/pre    Social History Main Topics  . Smoking status: Never Smoker   . Smokeless tobacco: Never Used  . Alcohol Use: Yes     Comment: 1 drink every 6 months.  . Drug Use: No  . Sexual Activity: Not Currently    Partners: Male   Other Topics Concern  . Not on file   Social History Narrative   Lives alone, [place]. Both sons live in [place]   Family History  Problem Relation Age of Onset  . Heart disease Mother 7    MI  . COPD Mother   . Hyperlipidemia Mother   . Hypertension Mother   . Heart disease Sister   . Heart disease Sister   . Hyperlipidemia Sister   . Hypertension Sister   . Diabetes Sister   .  Graves' disease Sister   . COPD Sister   . Heart disease Brother     57's  . Heart disease Brother   . COPD Brother   . Alcohol abuse Brother   . Diabetes Brother     borderline  . COPD Brother   . Alcohol abuse Father   . Diabetes Sister   . Heart disease Sister     CAD, s/p angioplasties  . Diabetes Sister   . Hyperlipidemia Sister   . Hypertension Sister   . Gout Sister   . Arthritis Sister    Current Outpatient Prescriptions on File Prior to Visit  Medication Sig Dispense Refill  . aspirin 81 MG tablet Take 81 mg by mouth daily.        . naproxen (NAPROSYN) 500 MG tablet Take 1 tablet (500 mg total) by mouth 2 (two) times daily with a meal.  30 tablet  0  . valACYclovir (VALTREX) 500 MG tablet TAKE 1 TABLET EVERY DAY  30 tablet  0  . cetirizine (ZYRTEC) 10 MG tablet Take 10 mg by mouth  daily.       No current facility-administered medications on file prior to visit.   No Known Allergies  ROS:  Denies fevers, chills, headaches, dizziness, chest pain, palpitations, shortness of breath, edema, bleeding, bruising, rashes, nausea, vomiting, bowel changes, URI symptoms, cough or other concerns.  Just the knee and hip pain per HPI  PHYSICAL EXAM: BP 108/70  Pulse 84  Ht 5' (1.524 m)  Wt 177 lb (80.287 kg)  BMI 34.57 kg/m2 Pleasant female, overweight, in no distress Neck: no lymphadenopathy, thyromegaly or carotid bruit Heart: regular rate and rhythm, no murmur Lungs: clear bilaterally Back: no CVA tenderness Abdomen: soft, nontender, no organomegaly or mass Extremities: Tender at R trochanteric bursa, and also along the IT Band.  Discomfort with IT band stretching. Some medial knee pain with varus stress.  No clicks, endpoints intact--normal lachman, drawer.  No joint line tenderness. No effusion or warmth Skin: no rashes or lesions noted Psych: normal mood, affect, hygiene and grooming Neuro: alert and oriented.  Cranial nerves intact, normal strength, gait.  X-ray R knee from 07/07/13 IMPRESSION:  1. 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  2. Chondrocalcinosis of the medial compartment. Question CPPD.  ASSESSMENT/PLAN:  Trochanteric bursitis of right hip - continue NSAIDs; return for cortisone injection if not resolving  Right knee pain - component of degenerative changes, as well as ITB.  shown stretches.  consider referral to ortho if worsening.  Tylenol vs NSAIDs discussed (risks/benefits)  Family history of early CAD - Plan: High sensitivity CRP, Cardio IQ (R) Advanced Lipid Panel, NMR Lipoprofile without Lipids  Pure hypercholesterolemia - check particle sizes, hs-CRP.  pull old records upon review of results, to determine if any treatment is indicated. Continue 97m ASA. - Plan: Cardio IQ (R) Advanced Lipid  Panel, NMR Lipoprofile without Lipids  Routine general medical examination at a health care facility - Plan: Comprehensive metabolic panel, CBC with Differential, TSH  Encounter for long-term (current) use of other medications - Plan: Comprehensive metabolic panel, CBC with Differential  Herpes labialis - controlled with daily meds  Stretches shown, do 2x day Try and back off and use Tylenol Arthritis for pain, and use Naproxen more sparingly, not BID long-term.  Consider return for cortisone injection to R hip and/or knee  c-met, lipids and particle size, hs-CRP, CBC, TSH  F/u for CPE as scheduled 8/31  Return  sooner for cortisone shot for hip bursitis, if desired 

## 2013-07-29 LAB — NMR LIPOPROFILE WITHOUT LIPIDS
HDL Particle Number: 38.2 umol/L (ref >=30.5)
HDL SIZE: 9.1 nm — AB (ref >=9.2)
LDL Particle Number: 1832 nmol/L — ABNORMAL HIGH (ref <1000)
LDL Size: 21.5 nm (ref >20.5)
LP-IR SCORE: 37 (ref <=45)
Large HDL-P: 7.4 umol/L (ref >=4.8)
Large VLDL-P: 3.7 nmol/L — ABNORMAL HIGH (ref <=2.7)
Small LDL Particle Number: 587 nmol/L — ABNORMAL HIGH (ref <=527)
VLDL SIZE: 46 nm (ref <=46.6)

## 2013-07-29 LAB — HIGH SENSITIVITY CRP: CRP HIGH SENSITIVITY: 1.1 mg/L

## 2013-07-29 LAB — TSH: TSH: 0.941 u[IU]/mL (ref 0.350–4.500)

## 2013-07-31 ENCOUNTER — Telehealth: Payer: Self-pay | Admitting: Family Medicine

## 2013-07-31 ENCOUNTER — Telehealth: Payer: Self-pay | Admitting: *Deleted

## 2013-07-31 LAB — LIPID PANEL
CHOLESTEROL: 282 mg/dL — AB (ref 0–200)
HDL: 62 mg/dL (ref >39)
LDL Cholesterol: 194 mg/dL — ABNORMAL HIGH (ref 0–99)
TRIGLYCERIDES: 131 mg/dL (ref <150)
Total CHOL/HDL Ratio: 4.5 Ratio
VLDL: 26 mg/dL (ref 0–40)

## 2013-07-31 NOTE — Telephone Encounter (Signed)
Lauren [name] called the lab and they state that they do not have enough serum to perform the IQ OR the lipid panel that is why the tests were canceled. Lauren [name] said she sent 4 SST tubes and there should have been enough.

## 2013-07-31 NOTE — Telephone Encounter (Signed)
I called [phone] regarding need for lipids to be added and advised her 4 extra tubes were drawn.  And we do not want to have to call pt again for lab draw.  Shirlean Mylar will call back.

## 2013-07-31 NOTE — Telephone Encounter (Signed)
Beverlee Nims is calling them to discuss.

## 2013-07-31 NOTE — Telephone Encounter (Signed)
I just want to make sure this is done in a timely manner since I'm not here tomorrow, and don't want it to wait until next week (when for sure she will need to be redrawn).  Thanks for looking into this!

## 2013-07-31 NOTE — Telephone Encounter (Signed)
That is ridiculous.  Where are the other SST tubes? Were they properly labeled?  If so, they should still have them, and then need to add on FLP.  The Cardio IQ had been listed "in process" and not canceled (until today).  Please have someone speak to a manager over there and clarify.  Clearly enough blood was drawn. If unable to locate, can they add the lipids on to the NMR that was done (it was done "without lipids", but really needs the lipids!).  If not, someone will need to contact the patient (and apologize for the inconvenience...)

## 2013-07-31 NOTE — Telephone Encounter (Signed)
I gave the rep my cell number to call and I will forward info to you.

## 2013-08-04 ENCOUNTER — Encounter: Payer: Self-pay | Admitting: Family Medicine

## 2013-08-04 ENCOUNTER — Ambulatory Visit (INDEPENDENT_AMBULATORY_CARE_PROVIDER_SITE_OTHER): Payer: BC Managed Care – PPO | Admitting: Family Medicine

## 2013-08-04 VITALS — BP 128/78 | HR 80 | Ht 60.0 in | Wt 175.0 lb

## 2013-08-04 DIAGNOSIS — M76899 Other specified enthesopathies of unspecified lower limb, excluding foot: Secondary | ICD-10-CM

## 2013-08-04 DIAGNOSIS — E7800 Pure hypercholesterolemia, unspecified: Secondary | ICD-10-CM

## 2013-08-04 DIAGNOSIS — M7061 Trochanteric bursitis, right hip: Secondary | ICD-10-CM

## 2013-08-04 MED ORDER — TRIAMCINOLONE ACETONIDE 40 MG/ML IJ SUSP
20.0000 mg | Freq: Once | INTRAMUSCULAR | Status: AC
  Administered 2013-08-04: 20 mg via INTRA_ARTICULAR

## 2013-08-04 MED ORDER — BUPIVACAINE HCL (PF) 0.5 % IJ SOLN
2.0000 mL | Freq: Once | INTRAMUSCULAR | Status: AC
  Administered 2013-08-04: 2 mL

## 2013-08-04 MED ORDER — LIDOCAINE HCL 2 % IJ SOLN
2.0000 mL | Freq: Once | INTRAMUSCULAR | Status: AC
  Administered 2013-08-04: 40 mg

## 2013-08-04 NOTE — Patient Instructions (Addendum)
Fat and Cholesterol Control Diet Fat and cholesterol levels in your blood and organs are influenced by your diet. High levels of fat and cholesterol may lead to diseases of the heart, small and large blood vessels, gallbladder, liver, and pancreas. CONTROLLING FAT AND CHOLESTEROL WITH DIET Although exercise and lifestyle factors are important, your diet is key. That is because certain foods are known to raise cholesterol and others to lower it. The goal is to balance foods for their effect on cholesterol and more importantly, to replace saturated and trans fat with other types of fat, such as monounsaturated fat, polyunsaturated fat, and omega-3 fatty acids. On average, a person should consume no more than 15 to 17 g of saturated fat daily. Saturated and trans fats are considered "bad" fats, and they will raise LDL cholesterol. Saturated fats are primarily found in animal products such as meats, butter, and cream. However, that does not mean you need to give up all your favorite foods. Today, there are good tasting, low-fat, low-cholesterol substitutes for most of the things you like to eat. Choose low-fat or nonfat alternatives. Choose round or loin cuts of red meat. These types of cuts are lowest in fat and cholesterol. Chicken (without the skin), fish, veal, and ground turkey breast are great choices. Eliminate fatty meats, such as hot dogs and salami. Even shellfish have little or no saturated fat. Have a 3 oz (85 g) portion when you eat lean meat, poultry, or fish. Trans fats are also called "partially hydrogenated oils." They are oils that have been scientifically manipulated so that they are solid at room temperature resulting in a longer shelf life and improved taste and texture of foods in which they are added. Trans fats are found in stick margarine, some tub margarines, cookies, crackers, and baked goods.  When baking and cooking, oils are a great substitute for butter. The monounsaturated oils are  especially beneficial since it is believed they lower LDL and raise HDL. The oils you should avoid entirely are saturated tropical oils, such as coconut and palm.  Remember to eat a lot from food groups that are naturally free of saturated and trans fat, including fish, fruit, vegetables, beans, grains (barley, rice, couscous, bulgur wheat), and pasta (without cream sauces).  IDENTIFYING FOODS THAT LOWER FAT AND CHOLESTEROL  Soluble fiber may lower your cholesterol. This type of fiber is found in fruits such as apples, vegetables such as broccoli, potatoes, and carrots, legumes such as beans, peas, and lentils, and grains such as barley. Foods fortified with plant sterols (phytosterol) may also lower cholesterol. You should eat at least 2 g per day of these foods for a cholesterol lowering effect.  Read package labels to identify low-saturated fats, trans fat free, and low-fat foods at the supermarket. Select cheeses that have only 2 to 3 g saturated fat per ounce. Use a heart-healthy tub margarine that is free of trans fats or partially hydrogenated oil. When buying baked goods (cookies, crackers), avoid partially hydrogenated oils. Breads and muffins should be made from whole grains (whole-wheat or whole oat flour, instead of "flour" or "enriched flour"). Buy non-creamy canned soups with reduced salt and no added fats.  FOOD PREPARATION TECHNIQUES  Never deep-fry. If you must fry, either stir-fry, which uses very little fat, or use non-stick cooking sprays. When possible, broil, bake, or roast meats, and steam vegetables. Instead of putting butter or margarine on vegetables, use lemon and herbs, applesauce, and cinnamon (for squash and sweet potatoes). Use nonfat   yogurt, salsa, and low-fat dressings for salads.  LOW-SATURATED FAT / LOW-FAT FOOD SUBSTITUTES Meats / Saturated Fat (g)  Avoid: Steak, marbled (3 oz/85 g) / 11 g  Choose: Steak, lean (3 oz/85 g) / 4 g  Avoid: Hamburger (3 oz/85 g) / 7  g  Choose: Hamburger, lean (3 oz/85 g) / 5 g  Avoid: Ham (3 oz/85 g) / 6 g  Choose: Ham, lean cut (3 oz/85 g) / 2.4 g  Avoid: Chicken, with skin, dark meat (3 oz/85 g) / 4 g  Choose: Chicken, skin removed, dark meat (3 oz/85 g) / 2 g  Avoid: Chicken, with skin, light meat (3 oz/85 g) / 2.5 g  Choose: Chicken, skin removed, light meat (3 oz/85 g) / 1 g Dairy / Saturated Fat (g)  Avoid: Whole milk (1 cup) / 5 g  Choose: Low-fat milk, 2% (1 cup) / 3 g  Choose: Low-fat milk, 1% (1 cup) / 1.5 g  Choose: Skim milk (1 cup) / 0.3 g  Avoid: Hard cheese (1 oz/28 g) / 6 g  Choose: Skim milk cheese (1 oz/28 g) / 2 to 3 g  Avoid: Cottage cheese, 4% fat (1 cup) / 6.5 g  Choose: Low-fat cottage cheese, 1% fat (1 cup) / 1.5 g  Avoid: Ice cream (1 cup) / 9 g  Choose: Sherbet (1 cup) / 2.5 g  Choose: Nonfat frozen yogurt (1 cup) / 0.3 g  Choose: Frozen fruit bar / trace  Avoid: Whipped cream (1 tbs) / 3.5 g  Choose: Nondairy whipped topping (1 tbs) / 1 g Condiments / Saturated Fat (g)  Avoid: Mayonnaise (1 tbs) / 2 g  Choose: Low-fat mayonnaise (1 tbs) / 1 g  Avoid: Butter (1 tbs) / 7 g  Choose: Extra light margarine (1 tbs) / 1 g  Avoid: Coconut oil (1 tbs) / 11.8 g  Choose: Olive oil (1 tbs) / 1.8 g  Choose: Corn oil (1 tbs) / 1.7 g  Choose: Safflower oil (1 tbs) / 1.2 g  Choose: Sunflower oil (1 tbs) / 1.4 g  Choose: Soybean oil (1 tbs) / 2.4 g  Choose: Canola oil (1 tbs) / 1 g Document Released: 02/13/2005 Document Revised: 06/10/2012 Document Reviewed: 08/04/2010 [employer] Patient Information 2014 [place], [place].   Hip Bursitis Bursitis is a swelling and soreness (inflammation) of a fluid-filled sac (bursa). This sac overlies and protects the joints.  CAUSES   Injury.  Overuse of the muscles surrounding the joint.  Arthritis.  Gout.  Infection.  Cold weather.  Inadequate warm-up and conditioning prior to activities. The cause may not be  known.  SYMPTOMS   Mild to severe irritation.  Tenderness and swelling over the outside of the hip.  Pain with motion of the hip.  If the bursa becomes infected, a fever may be present. Redness, tenderness, and warmth will develop over the hip. Symptoms usually lessen in 3 to 4 weeks with treatment, but can come back. TREATMENT If conservative treatment does not work, your caregiver may advise draining the bursa and injecting cortisone into the area. This may speed up the healing process. This may also be used as an initial treatment of choice. HOME CARE INSTRUCTIONS   Apply ice to the affected area for 15-20 minutes every 3 to 4 hours while awake for the first 2 days. Put the ice in a plastic bag and place a towel between the bag of ice and your skin.  Rest the painful joint as much  as possible, but continue to put the joint through a normal range of motion at least 4 times per day. When the pain lessens, begin normal, slow movements and usual activities to help prevent stiffness of the hip.  Only take over-the-counter or prescription medicines for pain, discomfort, or fever as directed by your caregiver.  Use crutches to limit weight bearing on the hip joint, if advised.  Elevate your painful hip to reduce swelling. Use pillows for propping and cushioning your legs and hips.  Gentle massage may provide comfort and decrease swelling. SEEK IMMEDIATE MEDICAL CARE IF:   Your pain increases even during treatment, or you are not improving.  You have a fever.  You have heat and inflammation over the involved bursa.  You have any other questions or concerns. MAKE SURE YOU:   Understand these instructions.  Will watch your condition.  Will get help right away if you are not doing well or get worse. Document Released: 08/05/2001 Document Revised: 05/08/2011 Document Reviewed: 03/04/2008 [facility] Patient Information 2014 [place], [place].

## 2013-08-04 NOTE — Progress Notes (Signed)
Chief Complaint  Patient presents with  . Injections    right hip cortisone injection.   Pt is also here to f/u on her recent labs.  Hyperlipidemia:  She eats red meat only once a month.  She has been eating more eggs in the last 3 years.  She has been consistently eating 4 eggs/week (when she makes them for her great granddaughter), and often more--she will eat her granddaughter's egg yolks too when she hard boils the eggs. Drinks 2% milk, doesn't eat much cheese.  Occasional pimiento cheese sandwich.  Admits to skipping meals, usually just eating dinner.  She has been trying to improve that.  She has continued R hip pain, and presents for cortisone injection to R trochanteric bursa.  Risks/alternatives were reviewed, and she verbally consents to procedure  Past Medical History  Diagnosis Date  . Hyperlipidemia   . Facial basal cell cancer     nose (Dr. Ubaldo Glassing), leg  . Squamous cell skin cancer     many sites (hands/legs)  . Herpes simplex labialis    Past Surgical History  Procedure Laterality Date  . Cardiovascular stress test  06-2005    Dr. Rex Kras  . Cesarean section  x2  . Appendectomy     History   Social History  . Marital Status: Divorced    Spouse Name: N/A    Number of Children: 2  . Years of Education: N/A   Occupational History  . used Teacher, early years/pre    Social History Main Topics  . Smoking status: Never Smoker   . Smokeless tobacco: Never Used  . Alcohol Use: Yes     Comment: 1 drink every 6 months.  . Drug Use: No  . Sexual Activity: Not Currently    Partners: Male   Other Topics Concern  . Not on file   Social History Narrative   Lives alone, [place]. Both sons live in [place]   Current Outpatient Prescriptions on File Prior to Visit  Medication Sig Dispense Refill  . aspirin 81 MG tablet Take 81 mg by mouth daily.        . naproxen (NAPROSYN) 500 MG tablet Take 1 tablet (500 mg total) by mouth 2 (two) times daily with a meal.  30 tablet  0  . cetirizine  (ZYRTEC) 10 MG tablet Take 10 mg by mouth daily.      . valACYclovir (VALTREX) 500 MG tablet TAKE 1 TABLET EVERY DAY  30 tablet  0   No current facility-administered medications on file prior to visit.   No Known Allergies  ROS: R hip and lateral thigh pain, some knee pain.  No GI complaints, chest pain, shortness of breath, edema, bruising, rash or other concerns.  PHYSICAL EXAM: BP 128/78  Pulse 80  Ht 5' (1.524 m)  Wt 175 lb (79.379 kg)  BMI 34.18 kg/m2 Tender over R greater trochanter, as well as along ITB on right  Lab Results  Component Value Date   CHOL 282* 07/28/2013   HDL 62 07/28/2013   LDLCALC 194* 07/28/2013   TRIG 131 07/28/2013   CHOLHDL 4.5 07/28/2013   See NMR results, which were also reviewed.  Hs-CRP 1.1    Chemistry      Component Value Date/Time   NA 137 07/28/2013 1230   K 4.5 07/28/2013 1230   CL 103 07/28/2013 1230   CO2 24 07/28/2013 1230   BUN 11 07/28/2013 1230   CREATININE 0.65 07/28/2013 1230      Component  Value Date/Time   CALCIUM 9.4 07/28/2013 1230   ALKPHOS 50 07/28/2013 1230   AST 9 07/28/2013 1230   ALT 13 07/28/2013 1230   BILITOT 0.4 07/28/2013 1230     Lab Results  Component Value Date   TSH 0.941 07/28/2013   Lab Results  Component Value Date   WBC 3.9* 07/28/2013   HGB 12.9 07/28/2013   HCT 37.6 07/28/2013   MCV 87.2 07/28/2013   PLT 289 07/28/2013   Procedure: Area was prepped/cleansed with alcohol.  Injected with 1cc 2%lido, 1cc marcaine, and 20mg  of kenelog after cooling skin with ethyl chloride. She tolerated the procedure well with improvement in symptoms and no complications.  ASSESSMENT/PLAN:  Pure hypercholesterolemia - counseled extensively re: diet, goals. dietary trial x 3 mos, and will return for fasting lipids - Plan: Lipid panel  Trochanteric bursitis of right hip - s/p cortisone injection today  Counseled re: cholesterol.  Repeat in 3 months.  Might need to consider medications if remains high.

## 2013-08-05 NOTE — Telephone Encounter (Signed)
Labs resulted.

## 2013-09-12 LAB — HM MAMMOGRAPHY

## 2013-09-15 ENCOUNTER — Encounter: Payer: Self-pay | Admitting: Internal Medicine

## 2013-09-18 ENCOUNTER — Encounter: Payer: Self-pay | Admitting: Family Medicine

## 2013-10-10 ENCOUNTER — Other Ambulatory Visit: Payer: Self-pay | Admitting: Dermatology

## 2013-10-27 ENCOUNTER — Ambulatory Visit (INDEPENDENT_AMBULATORY_CARE_PROVIDER_SITE_OTHER): Payer: BC Managed Care – PPO | Admitting: Family Medicine

## 2013-10-27 ENCOUNTER — Encounter: Payer: Self-pay | Admitting: Family Medicine

## 2013-10-27 ENCOUNTER — Other Ambulatory Visit (HOSPITAL_COMMUNITY)
Admission: RE | Admit: 2013-10-27 | Discharge: 2013-10-27 | Disposition: A | Discharge status: Home / Self Care | Payer: BC Managed Care – PPO | Source: Ambulatory Visit | Attending: Family Medicine | Admitting: Family Medicine

## 2013-10-27 VITALS — BP 154/80 | HR 60 | Ht 61.0 in | Wt 176.0 lb

## 2013-10-27 DIAGNOSIS — B001 Herpesviral vesicular dermatitis: Secondary | ICD-10-CM

## 2013-10-27 DIAGNOSIS — Z1151 Encounter for screening for human papillomavirus (HPV): Secondary | ICD-10-CM | POA: Insufficient documentation

## 2013-10-27 DIAGNOSIS — L304 Erythema intertrigo: Secondary | ICD-10-CM

## 2013-10-27 DIAGNOSIS — B009 Herpesviral infection, unspecified: Secondary | ICD-10-CM

## 2013-10-27 DIAGNOSIS — Z0000 Encounter for general adult medical examination without abnormal findings: Secondary | ICD-10-CM

## 2013-10-27 DIAGNOSIS — E7800 Pure hypercholesterolemia, unspecified: Secondary | ICD-10-CM

## 2013-10-27 DIAGNOSIS — L538 Other specified erythematous conditions: Secondary | ICD-10-CM

## 2013-10-27 DIAGNOSIS — Z01419 Encounter for gynecological examination (general) (routine) without abnormal findings: Secondary | ICD-10-CM | POA: Insufficient documentation

## 2013-10-27 DIAGNOSIS — Z23 Encounter for immunization: Secondary | ICD-10-CM

## 2013-10-27 DIAGNOSIS — IMO0001 Reserved for inherently not codable concepts without codable children: Secondary | ICD-10-CM

## 2013-10-27 DIAGNOSIS — R030 Elevated blood-pressure reading, without diagnosis of hypertension: Secondary | ICD-10-CM

## 2013-10-27 LAB — POCT URINALYSIS DIPSTICK
BILIRUBIN UA: NEGATIVE
Blood, UA: NEGATIVE
Glucose, UA: NEGATIVE
KETONES UA: NEGATIVE
LEUKOCYTES UA: NEGATIVE
Nitrite, UA: NEGATIVE
PH UA: 5
Protein, UA: NEGATIVE
Spec Grav, UA: 1.01
Urobilinogen, UA: NEGATIVE

## 2013-10-27 MED ORDER — VALACYCLOVIR HCL 500 MG PO TABS: ORAL_TABLET | ORAL | Status: DC

## 2013-10-27 NOTE — Progress Notes (Signed)
Chief Complaint  Patient presents with  . Annual Exam    nonfasting annual exam with pap. No concerns. Did mention that when she eats ice cream she will have leg cramps inthe middle of the night, and wonders why.     Lauren [name] is a 63 y.o. female who presents for a complete physical and follow-up of other medical issues.  Hyperlipidemia/family history of CAD: She had been on Crestor and Lipitor in the past, stating that she only took each of them for about 2-3 weeks. Crestor made her dizzy and lightheaded. Lipitor was "too strong", so dose was lowered. She couldn't tolerate even the lower dose due to muscles and bones aching. She had been seeing Dr. Rex Kras (years ago), had normal stress tests. She tries to follow a low cholesterol diet. At her visit in early June we counseled extensively re: diet, and is due to have lipids rechecked after some dietary changes made. She has only had about 2 eggs since June.  She cut back on cheese, as well as ice cream (due to leg cramps--see chief complaint).  If cholesterol remains high, lipid-lowering medication will need to be started.  She is not fasting today. She denies having any chest pain, dyspnea, edema or palpitations.   Right trochanteric bursitis:  She had steroid injection 08/04/2013.  She is overall much improved--occasionally still has some pain in her right thigh in certain positions (ie sitting in certain chairs in restaurants).    Right knee pain: Overall much improved.    Herpes Labialis--infrequently flares in the winter, only takes med prn then, but uses daily in the summer (more flares when she is out in the sun, and related to stress.)  She states she gets fever blisters when in the same room as others with fever blisters--but in discussing this, she realizes that it is usually her sisters, and stress is involved.   Immunization History  Administered Date(s) Administered  . Influenza Split 11/24/2010  . Pneumococcal Polysaccharide-23  10/05/2008  . Td 02/28/2000  . Tdap 10/05/2008  . Zoster 11/24/2010   Last Pap smear: 10/2010 Last mammogram: 08/2013 Last colonoscopy: 3/08 Last DEXA: -08 ir -09 at [facility], per pt Dentist: twice yearly Ophtho: none since LASIK about 10 years ago Exercise: just through her job, walking the auctions 2x/week (3 hours each) Sees dermatologist regularly--recently saw Dr. Ubaldo Glassing, had biopsy showing irritated SK   Past Medical History  Diagnosis Date  . Hyperlipidemia   . Facial basal cell cancer     nose (Dr. Ubaldo Glassing), leg  . Squamous cell skin cancer     many sites (hands/legs)  . Herpes simplex labialis     Past Surgical History  Procedure Laterality Date  . Cardiovascular stress test  06-2005    Dr. Rex Kras  . Cesarean section  x2  . Appendectomy    . Refractive surgery  age 13    History   Social History  . Marital Status: Divorced    Spouse Name: N/A    Number of Children: 2  . Years of Education: N/A   Occupational History  . 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 Smoking status: Never Smoker   . Smokeless tobacco: Never Used  . Alcohol Use: Yes     Comment: 1 drink every 6 months.  . Drug Use: No  . Sexual Activity: Not Currently    Partners: Male   Other Topics Concern  . Not on file  Social History Narrative   Lives alone, [place]. Both sons live in [place]    Family History  Problem Relation Age of Onset  . Heart disease Mother 51    MI  . COPD Mother   . Hyperlipidemia Mother   . Hypertension Mother   . Heart disease Sister   . Heart disease Sister   . Hyperlipidemia Sister   . Hypertension Sister   . Diabetes Sister   . Graves' disease Sister   . COPD Sister   . Heart disease Brother     32's  . Heart disease Brother   . COPD Brother   . Alcohol abuse Brother   . Diabetes Brother     borderline  . COPD Brother   . Alcohol abuse Father   . Stroke Father 49  . Diabetes Sister   . Heart disease Sister     CAD, s/p  angioplasties  . Diabetes Sister   . Hyperlipidemia Sister   . Hypertension Sister   . Gout Sister   . Arthritis Sister     Outpatient Encounter Prescriptions as of 10/27/2013  Medication Sig Note  . aspirin 81 MG tablet Take 81 mg by mouth daily.   07/07/2013: Takes it most days  . cetirizine (ZYRTEC) 10 MG tablet Take 10 mg by mouth daily. 07/28/2013: Takes prn, not now  . valACYclovir (VALTREX) 500 MG tablet TAKE 1 TABLET EVERY DAY 10/27/2013: Takes daily during most of the summer, prn rest of the year.  Not currently taking  . [DISCONTINUED] naproxen (NAPROSYN) 500 MG tablet Take 1 tablet (500 mg total) by mouth 2 (two) times daily with a meal.    No Known Allergies  ROS: The patient denies anorexia, fever, weight changes, headaches, vision changes, decreased hearing, ear pain, sore throat, breast concerns, chest pain, palpitations, dizziness, syncope, dyspnea on exertion, cough, swelling, nausea, vomiting, diarrhea, constipation, abdominal pain, melena, hematochezia, indigestion/heartburn, hematuria, incontinence, dysuria, vaginal bleeding, discharge, odor or itch, genital lesions, joint pains, numbness, tingling, weakness, tremor, suspicious skin lesions (recently treated/biopsied by Dr. Ubaldo Glassing), depression, anxiety, abnormal bleeding/bruising, or enlarged lymph nodes.  +stressors (slow in business; lazy son--not working, borrowing money)  PHYSICAL EXAM:  BP 160/80  Pulse 60  Ht 5\' 1"  (1.549 m)  Wt 176 lb (79.833 kg)  BMI 33.27 kg/m2 154/80 on repeat by MD, RA  General Appearance:  Alert, cooperative, no distress, appears stated age   Head:  Normocephalic, without obvious abnormality, atraumatic   Eyes:  PERRL, EOM's intact, fundi benign. Nasally on L eye there is a grayish round discoloration (approx 2-73mm). Similar discoloration noted on left, temporally, but more diffuse, almost looks vascular  Ears:  Normal TM's and external ear canals   Nose:  Nares normal, mucosa normal, no  drainage or sinus tenderness   Throat:  Lips, mucosa, and tongue normal; teeth and gums normal   Neck:  Supple, no lymphadenopathy; thyroid: no enlargement/tenderness/nodules; no carotid  bruit or JVD   Back:  Spine nontender, no curvature, ROM normal, no CVA tenderness   Lungs:  Clear to auscultation bilaterally without wheezes, rales or ronchi; respirations unlabored   Chest Wall:  No tenderness or deformity   Heart:  Regular rate and rhythm, S1 and S2 normal, no murmur, rub  or gallop   Breast Exam:  No tenderness, masses, or nipple discharge or inversion. No axillary lymphadenopathy   Abdomen:  Soft, non-tender, nondistended, normoactive bowel sounds,  no masses, no hepatosplenomegaly   Genitalia:  Normal external  genitalia without lesions. BUS and vagina normal; cervix without lesions, or cervical motion tenderness. Stenotic cervical os. No abnormal vaginal discharge. Uterus and adnexa not enlarged, nontender, no masses. Pap performed   Rectal:  Normal tone, no masses or tenderness; guaiac negative stool   Extremities:  No clubbing, cyanosis or edema   Pulses:  2+ and symmetric all extremities   Skin:  Skin color, texture, turgor normal, no lesions.  There is mild erythema with faint satellite lesions bilaterally under breasts (with heavy powder applied)  Lymph nodes:  Cervical, supraclavicular, and axillary nodes normal   Neurologic:  CNII-XII intact, normal strength, sensation and gait; reflexes 2+ and symmetric throughout          Psych: Normal mood, affect, hygiene and grooming.    Lab Results  Component Value Date   CHOL 282* 07/28/2013   HDL 62 07/28/2013   LDLCALC 194* 07/28/2013   TRIG 131 07/28/2013   CHOLHDL 4.5 07/28/2013   LDL particle# 7253 (high) Large VLDL 3.7--high Small LDL 587--high    Chemistry      Component Value Date/Time   NA 137 07/28/2013 1230   K 4.5 07/28/2013 1230   CL 103 07/28/2013 1230   CO2 24 07/28/2013 1230   BUN 11 07/28/2013 1230   CREATININE 0.65  07/28/2013 1230      Component Value Date/Time   CALCIUM 9.4 07/28/2013 1230   ALKPHOS 50 07/28/2013 1230   AST 9 07/28/2013 1230   ALT 13 07/28/2013 1230   BILITOT 0.4 07/28/2013 1230     Lab Results  Component Value Date   WBC 3.9* 07/28/2013   HGB 12.9 07/28/2013   HCT 37.6 07/28/2013   MCV 87.2 07/28/2013   PLT 289 07/28/2013   Lab Results  Component Value Date   TSH 0.941 07/28/2013   HS-CRP 1.1 (average risk)   ASSESSMENT/PLAN:  Routine general medical examination at a health care facility - Plan: POCT Urinalysis Dipstick, Visual acuity screening, Cytology - PAP [place]  Pure hypercholesterolemia  Herpes labialis - reviewed proper way to take for prn use for flares vs daily use (in summer) - Plan: valACYclovir (VALTREX) 500 MG tablet  Elevated blood pressure - normal in past.  check elsewhere.  low sodium diet, regular exercise, stress reduction.  f/u if persistently elevated  Intertrigo - mild, recent onset.  continue powder. change to cotton bra.  antifungal (OTC) BID prn  Need for prophylactic vaccination and inoculation against influenza - Plan: Flu Vaccine QUAD 36+ mos PF IM (Fluarix Quad PF)   Discussed monthly self breast exams and yearly mammograms; at least 30 minutes of aerobic activity at least 5 days/week, weight-bearing exercise 2x/week; proper sunscreen use reviewed; healthy diet, including goals of calcium and vitamin D intake and alcohol recommendations (less than or equal to 1 drink/day) reviewed; regular seatbelt use; changing batteries in smoke detectors. Immunization recommendations discussed-- flu shot given today. Colonoscopy recommendations reviewed--UTD   Counseled re: stress reduction techniques  Obesity--counseled re: diet and exercise Elevated BP--low sodium diet, exercise, weight loss  Schedule eye exam

## 2013-10-27 NOTE — Patient Instructions (Addendum)
HEALTH MAINTENANCE RECOMMENDATIONS:  It is recommended that you get at least 30 minutes of aerobic exercise at least 5 days/week (for weight loss, you may need as much as 60-90 minutes). This can be any activity that gets your heart rate up. This can be divided in 10-15 minute intervals if needed, but try and build up your endurance at least once a week.  Weight bearing exercise is also recommended twice weekly.  Eat a healthy diet with lots of vegetables, fruits and fiber.  "Colorful" foods have a lot of vitamins (ie green vegetables, tomatoes, red peppers, etc).  Limit sweet tea, regular sodas and alcoholic beverages, all of which has a lot of calories and sugar.  Up to 1 alcoholic drink daily may be beneficial for women (unless trying to lose weight, watch sugars).  Drink a lot of water.  Calcium recommendations are 1200-1500 mg daily (1500 mg for postmenopausal women or women without ovaries), and vitamin D 1000 IU daily.  This should be obtained from diet and/or supplements (vitamins), and calcium should not be taken all at once, but in divided doses.  Monthly self breast exams and yearly mammograms for women over the age of 48 is recommended.  Sunscreen of at least SPF 30 should be used on all sun-exposed parts of the skin when outside between the hours of 10 am and 4 pm (not just when at beach or pool, but even with exercise, golf, tennis, and yard work!)  Use a sunscreen that says "broad spectrum" so it covers both UVA and UVB rays, and make sure to reapply every 1-2 hours.  Remember to change the batteries in your smoke detectors when changing your clock times in the spring and fall.  Use your seat belt every time you are in a car, and please drive safely and not be distracted with cell phones and texting while driving.  Schedule eye exam.  Check your blood pressure periodically elsewhere.  Normal I <140/90, although ideally we would like to see 130/80 or less.  If it remains >140/90,  you need to return and bring your list of blood pressures to discuss.  Exercising daily, working on stress reduction as we discussed, losing weight and following a low sodium diet will help keep the blood pressure down.  Avoid decongestants when BP is high.  Low-Sodium Eating Plan Sodium raises blood pressure and causes water to be held in the body. Getting less sodium from food will help lower your blood pressure, reduce any swelling, and protect your heart, liver, and kidneys. We get sodium by adding salt (sodium chloride) to food. Most of our sodium comes from canned, boxed, and frozen foods. Restaurant foods, fast foods, and pizza are also very high in sodium. Even if you take medicine to lower your blood pressure or to reduce fluid in your body, getting less sodium from your food is important. WHAT IS MY PLAN? Most people should limit their sodium intake to 2,300 mg a day. Your health care provider recommends that you limit your sodium intake to __________ a day.  WHAT DO I NEED TO KNOW ABOUT THIS EATING PLAN? For the low-sodium eating plan, you will follow these general guidelines:  Choose foods with a % Daily Value for sodium of less than 5% (as listed on the food label).   Use salt-free seasonings or herbs instead of table salt or sea salt.   Check with your health care provider or pharmacist before using salt substitutes.   Eat fresh foods.  Eat more vegetables and fruits.  Limit canned vegetables. If you do use them, rinse them well to decrease the sodium.   Limit cheese to 1 oz (28 g) per day.   Eat lower-sodium products, often labeled as "lower sodium" or "no salt added."  Avoid foods that contain monosodium glutamate (MSG). MSG is sometimes added to [place] food and some canned foods.  Check food labels (Nutrition Facts labels) on foods to learn how much sodium is in one serving.  Eat more home-cooked food and less restaurant, buffet, and fast food.  When eating  at a restaurant, ask that your food be prepared with less salt or none, if possible.  HOW DO I READ FOOD LABELS FOR SODIUM INFORMATION? The Nutrition Facts label lists the amount of sodium in one serving of the food. If you eat more than one serving, you must multiply the listed amount of sodium by the number of servings. Food labels may also identify foods as:  Sodium free--Less than 5 mg in a serving.  Very low sodium--35 mg or less in a serving.  Low sodium--140 mg or less in a serving.  Light in sodium--50% less sodium in a serving. For example, if a food that usually has 300 mg of sodium is changed to become light in sodium, it will have 150 mg of sodium.  Reduced sodium--25% less sodium in a serving. For example, if a food that usually has 400 mg of sodium is changed to reduced sodium, it will have 300 mg of sodium. WHAT FOODS CAN I EAT? Grains Low-sodium cereals, including oats, puffed wheat and rice, and shredded wheat cereals. Low-sodium crackers. Unsalted rice and pasta. Lower-sodium bread.  Vegetables Frozen or fresh vegetables. Low-sodium or reduced-sodium canned vegetables. Low-sodium or reduced-sodium tomato sauce and paste. Low-sodium or reduced-sodium tomato and vegetable juices.  Fruits Fresh, frozen, and canned fruit. Fruit juice.  Meat and Other Protein Products Low-sodium canned tuna and salmon. Fresh or frozen meat, poultry, seafood, and fish. Lamb. Unsalted nuts. Dried beans, peas, and lentils without added salt. Unsalted canned beans. Homemade soups without salt. Eggs.  Dairy Milk. Soy milk. Ricotta cheese. Low-sodium or reduced-sodium cheeses. Yogurt.  Condiments Fresh and dried herbs and spices. Salt-free seasonings. Onion and garlic powders. Low-sodium varieties of mustard and ketchup. Lemon juice.  Fats and Oils Reduced-sodium salad dressings. Unsalted butter.  Other Unsalted popcorn and pretzels.  The items listed above may not be a complete list  of recommended foods or beverages. Contact your dietitian for more options. WHAT FOODS ARE NOT RECOMMENDED? Grains Instant hot cereals. Bread stuffing, pancake, and biscuit mixes. Croutons. Seasoned rice or pasta mixes. Noodle soup cups. Boxed or frozen macaroni and cheese. Self-rising flour. Regular salted crackers. Vegetables Regular canned vegetables. Regular canned tomato sauce and paste. Regular tomato and vegetable juices. Frozen vegetables in sauces. Salted french fries. Olives. Angie Fava. Relishes. Sauerkraut. Salsa. Meat and Other Protein Products Salted, canned, smoked, spiced, or pickled meats, seafood, or fish. Bacon, ham, sausage, hot dogs, corned beef, chipped beef, and packaged luncheon meats. Salt pork. Jerky. Pickled herring. Anchovies, regular canned tuna, and sardines. Salted nuts. Dairy Processed cheese and cheese spreads. Cheese curds. Blue cheese and cottage cheese. Buttermilk.  Condiments Onion and garlic salt, seasoned salt, table salt, and sea salt. Canned and packaged gravies. Worcestershire sauce. Tartar sauce. Barbecue sauce. Teriyaki sauce. Soy sauce, including reduced sodium. Steak sauce. Fish sauce. Oyster sauce. Cocktail sauce. Horseradish. Regular ketchup and mustard. Meat flavorings and tenderizers. Bouillon cubes. Hot sauce.  Tabasco sauce. Marinades. Taco seasonings. Relishes. Fats and Oils Regular salad dressings. Salted butter. Margarine. Ghee. Bacon fat.  Other Potato and tortilla chips. Corn chips and puffs. Salted popcorn and pretzels. Canned or dried soups. Pizza. Frozen entrees and pot pies.  The items listed above may not be a complete list of foods and beverages to avoid. Contact your dietitian for more information. Document Released: 08/05/2001 Document Revised: 02/18/2013 Document Reviewed: 12/18/2012 [facility] Patient Information 2015 [place], [place]. This information is not intended to replace advice given to you by your health care provider.  Make sure you discuss any questions you have with your health care provider.

## 2013-10-29 LAB — CYTOLOGY - PAP

## 2013-11-04 ENCOUNTER — Other Ambulatory Visit: Payer: BC Managed Care – PPO

## 2013-11-04 DIAGNOSIS — E7800 Pure hypercholesterolemia, unspecified: Secondary | ICD-10-CM

## 2013-11-04 LAB — LIPID PANEL
Cholesterol: 236 mg/dL — ABNORMAL HIGH (ref 0–200)
HDL: 68 mg/dL (ref >39)
LDL Cholesterol: 148 mg/dL — ABNORMAL HIGH (ref 0–99)
TRIGLYCERIDES: 99 mg/dL (ref <150)
Total CHOL/HDL Ratio: 3.5 Ratio
VLDL: 20 mg/dL (ref 0–40)

## 2013-11-10 ENCOUNTER — Other Ambulatory Visit (INDEPENDENT_AMBULATORY_CARE_PROVIDER_SITE_OTHER): Payer: BC Managed Care – PPO

## 2013-11-10 DIAGNOSIS — Z1211 Encounter for screening for malignant neoplasm of colon: Secondary | ICD-10-CM

## 2013-11-10 LAB — HEMOCCULT GUIAC POC 1CARD (OFFICE)
Card #2 Fecal Occult Blod, POC: NEGATIVE
FECAL OCCULT BLD: NEGATIVE
Fecal Occult Blood, POC: NEGATIVE

## 2013-12-29 ENCOUNTER — Encounter: Payer: Self-pay | Admitting: Family Medicine

## 2014-05-15 ENCOUNTER — Other Ambulatory Visit: Payer: Self-pay | Admitting: Dermatology

## 2014-10-20 DIAGNOSIS — Z1231 Encounter for screening mammogram for malignant neoplasm of breast: Secondary | ICD-10-CM | POA: Diagnosis not present

## 2014-10-20 LAB — HM MAMMOGRAPHY: HM MAMMO: NORMAL

## 2014-10-22 ENCOUNTER — Encounter: Payer: Self-pay | Admitting: *Deleted

## 2014-10-29 ENCOUNTER — Encounter: Payer: Self-pay | Admitting: Family Medicine

## 2014-10-29 ENCOUNTER — Encounter: Payer: BC Managed Care – PPO | Admitting: Family Medicine

## 2014-10-29 ENCOUNTER — Ambulatory Visit (INDEPENDENT_AMBULATORY_CARE_PROVIDER_SITE_OTHER): Payer: Medicare Other | Admitting: Family Medicine

## 2014-10-29 VITALS — BP 138/76 | HR 72 | Ht 60.0 in | Wt 175.0 lb

## 2014-10-29 DIAGNOSIS — Z0000 Encounter for general adult medical examination without abnormal findings: Secondary | ICD-10-CM | POA: Diagnosis not present

## 2014-10-29 DIAGNOSIS — D692 Other nonthrombocytopenic purpura: Secondary | ICD-10-CM

## 2014-10-29 DIAGNOSIS — IMO0001 Reserved for inherently not codable concepts without codable children: Secondary | ICD-10-CM

## 2014-10-29 DIAGNOSIS — Z114 Encounter for screening for human immunodeficiency virus [HIV]: Secondary | ICD-10-CM | POA: Diagnosis not present

## 2014-10-29 DIAGNOSIS — L819 Disorder of pigmentation, unspecified: Secondary | ICD-10-CM | POA: Diagnosis not present

## 2014-10-29 DIAGNOSIS — Z1159 Encounter for screening for other viral diseases: Secondary | ICD-10-CM | POA: Diagnosis not present

## 2014-10-29 DIAGNOSIS — R030 Elevated blood-pressure reading, without diagnosis of hypertension: Secondary | ICD-10-CM

## 2014-10-29 DIAGNOSIS — E7800 Pure hypercholesterolemia, unspecified: Secondary | ICD-10-CM

## 2014-10-29 DIAGNOSIS — B001 Herpesviral vesicular dermatitis: Secondary | ICD-10-CM | POA: Diagnosis not present

## 2014-10-29 DIAGNOSIS — Z8249 Family history of ischemic heart disease and other diseases of the circulatory system: Secondary | ICD-10-CM

## 2014-10-29 DIAGNOSIS — Z23 Encounter for immunization: Secondary | ICD-10-CM | POA: Diagnosis not present

## 2014-10-29 DIAGNOSIS — M25551 Pain in right hip: Secondary | ICD-10-CM | POA: Diagnosis not present

## 2014-10-29 DIAGNOSIS — L570 Actinic keratosis: Secondary | ICD-10-CM

## 2014-10-29 DIAGNOSIS — L814 Other melanin hyperpigmentation: Secondary | ICD-10-CM

## 2014-10-29 LAB — COMPREHENSIVE METABOLIC PANEL
ALBUMIN: 4.3 g/dL (ref 3.6–5.1)
ALK PHOS: 50 U/L (ref 33–130)
ALT: 17 U/L (ref 6–29)
AST: 13 U/L (ref 10–35)
BILIRUBIN TOTAL: 0.4 mg/dL (ref 0.2–1.2)
BUN: 11 mg/dL (ref 7–25)
CALCIUM: 9.1 mg/dL (ref 8.6–10.4)
CO2: 27 mmol/L (ref 20–31)
Chloride: 104 mmol/L (ref 98–110)
Creat: 0.7 mg/dL (ref 0.50–0.99)
GLUCOSE: 83 mg/dL (ref 65–99)
POTASSIUM: 4.5 mmol/L (ref 3.5–5.3)
Sodium: 140 mmol/L (ref 135–146)
TOTAL PROTEIN: 6.8 g/dL (ref 6.1–8.1)

## 2014-10-29 LAB — CBC WITH DIFFERENTIAL/PLATELET
Basophils Absolute: 0 10*3/uL (ref 0.0–0.1)
Basophils Relative: 1 % (ref 0–1)
Eosinophils Absolute: 0.1 10*3/uL (ref 0.0–0.7)
Eosinophils Relative: 2 % (ref 0–5)
HCT: 39.9 % (ref 36.0–46.0)
HEMOGLOBIN: 13.7 g/dL (ref 12.0–15.0)
LYMPHS ABS: 1.4 10*3/uL (ref 0.7–4.0)
Lymphocytes Relative: 33 % (ref 12–46)
MCH: 30.3 pg (ref 26.0–34.0)
MCHC: 34.3 g/dL (ref 30.0–36.0)
MCV: 88.3 fL (ref 78.0–100.0)
MPV: 9.8 fL (ref 8.6–12.4)
Monocytes Absolute: 0.4 10*3/uL (ref 0.1–1.0)
Monocytes Relative: 10 % (ref 3–12)
NEUTROS ABS: 2.2 10*3/uL (ref 1.7–7.7)
NEUTROS PCT: 54 % (ref 43–77)
Platelets: 297 10*3/uL (ref 150–400)
RBC: 4.52 MIL/uL (ref 3.87–5.11)
RDW: 14 % (ref 11.5–15.5)
WBC: 4.1 10*3/uL (ref 4.0–10.5)

## 2014-10-29 LAB — LIPID PANEL
CHOLESTEROL: 248 mg/dL — AB (ref 125–200)
HDL: 69 mg/dL (ref >=46)
LDL Cholesterol: 155 mg/dL — ABNORMAL HIGH (ref <130)
Total CHOL/HDL Ratio: 3.6 Ratio (ref <=5.0)
Triglycerides: 121 mg/dL (ref <150)
VLDL: 24 mg/dL (ref <30)

## 2014-10-29 LAB — HEMOCCULT GUIAC POC 1CARD (OFFICE): FECAL OCCULT BLD: NEGATIVE

## 2014-10-29 LAB — POCT URINALYSIS DIPSTICK
Bilirubin, UA: NEGATIVE
Glucose, UA: NEGATIVE
Ketones, UA: NEGATIVE
Leukocytes, UA: NEGATIVE
Nitrite, UA: NEGATIVE
PROTEIN UA: NEGATIVE
RBC UA: NEGATIVE
SPEC GRAV UA: 1.015
UROBILINOGEN UA: NEGATIVE
pH, UA: 7

## 2014-10-29 LAB — TSH: TSH: 1.128 u[IU]/mL (ref 0.350–4.500)

## 2014-10-29 NOTE — Patient Instructions (Addendum)
HEALTH MAINTENANCE RECOMMENDATIONS:  It is recommended that you get at least 30 minutes of aerobic exercise at least 5 days/week (for weight loss, you may need as much as 60-90 minutes). This can be any activity that gets your heart rate up. This can be divided in 10-15 minute intervals if needed, but try and build up your endurance at least once a week.  Weight bearing exercise is also recommended twice weekly.  Eat a healthy diet with lots of vegetables, fruits and fiber.  "Colorful" foods have a lot of vitamins (ie green vegetables, tomatoes, red peppers, etc).  Limit sweet tea, regular sodas and alcoholic beverages, all of which has a lot of calories and sugar.  Up to 1 alcoholic drink daily may be beneficial for women (unless trying to lose weight, watch sugars).  Drink a lot of water.  Calcium recommendations are 1200-1500 mg daily (1500 mg for postmenopausal women or women without ovaries), and vitamin D 1000 IU daily.  This should be obtained from diet and/or supplements (vitamins), and calcium should not be taken all at once, but in divided doses.  Monthly self breast exams and yearly mammograms for women over the age of 85 is recommended.  Sunscreen of at least SPF 30 should be used on all sun-exposed parts of the skin when outside between the hours of 10 am and 4 pm (not just when at beach or pool, but even with exercise, golf, tennis, and yard work!)  Use a sunscreen that says "broad spectrum" so it covers both UVA and UVB rays, and make sure to reapply every 1-2 hours.  Remember to change the batteries in your smoke detectors when changing your clock times in the spring and fall.  Use your seat belt every time you are in a car, and please drive safely and not be distracted with cell phones and texting while driving.    Ms. Solorzano , Thank you for taking time to come for your Medicare Wellness Visit/physical. I appreciate your ongoing commitment to your health goals. Please review the  following plan we discussed and let me know if I can assist you in the future.   These are the goals we discussed: Goals    None      This is a list of the screening recommended for you and due dates:  Health Maintenance  Topic Date Due  .  Hepatitis C: One time screening is recommended by Center for Disease Control  (CDC) for  adults born from [birth year] through 1966.   [birth year]  . HIV Screening  10/26/1965  . Flu Shot  09/28/2014  . DEXA scan (bone density measurement)  10/27/2014  . Pneumonia vaccines (1 of 2 - PCV13) 10/27/2014  . Colon Cancer Screening  05/08/2016  . Mammogram  10/19/2016  . Tetanus Vaccine  10/06/2018  . Shingles Vaccine  Completed   Flu shot and prevnar (pneumonia vaccine) were given today. You will need another pneumonia vaccine (pneumovax--the same one you had in 2010) next year, along with yearly high dose flu shots. We did Hepatitis C and HIV tests today A Bone Density test is recommended.  Please call [facility] to schedule (prescription given today). Please schedule a routine eye exam.  Cut back on sweet tea intake--try and drink more water. Weight loss is recommended.  Go to [facility] to have right hip xray (either [address])  Leg cramps--stay well hydrated. Consider magnesium supplement vs some tonic water at bedtime (which has quinine).  Check out [facility] information about the white bar of soap in the bed.  Please check your blood pressure periodically, write it down and bring list to your visit in 2 months. Cut back on sodium in the diet. Daily exercise and weight loss will also help lower the blood pressure.  Low-Sodium Eating Plan Sodium raises blood pressure and causes water to be held in the body. Getting less sodium from food will help lower your blood pressure, reduce any swelling, and protect your heart, liver, and kidneys. We get sodium by adding salt (sodium chloride) to food. Most of our sodium comes from canned,  boxed, and frozen foods. Restaurant foods, fast foods, and pizza are also very high in sodium. Even if you take medicine to lower your blood pressure or to reduce fluid in your body, getting less sodium from your food is important. WHAT IS MY PLAN? Most people should limit their sodium intake to 2,300 mg a day. Your health care provider recommends that you limit your sodium intake to __________ a day.  WHAT DO I NEED TO KNOW ABOUT THIS EATING PLAN? For the low-sodium eating plan, you will follow these general guidelines:  Choose foods with a % Daily Value for sodium of less than 5% (as listed on the food label).   Use salt-free seasonings or herbs instead of table salt or sea salt.   Check with your health care provider or pharmacist before using salt substitutes.   Eat fresh foods.  Eat more vegetables and fruits.  Limit canned vegetables. If you do use them, rinse them well to decrease the sodium.   Limit cheese to 1 oz (28 g) per day.   Eat lower-sodium products, often labeled as "lower sodium" or "no salt added."  Avoid foods that contain monosodium glutamate (MSG). MSG is sometimes added to [place] food and some canned foods.  Check food labels (Nutrition Facts labels) on foods to learn how much sodium is in one serving.  Eat more home-cooked food and less restaurant, buffet, and fast food.  When eating at a restaurant, ask that your food be prepared with less salt or none, if possible.  HOW DO I READ FOOD LABELS FOR SODIUM INFORMATION? The Nutrition Facts label lists the amount of sodium in one serving of the food. If you eat more than one serving, you must multiply the listed amount of sodium by the number of servings. Food labels may also identify foods as:  Sodium free--Less than 5 mg in a serving.  Very low sodium--35 mg or less in a serving.  Low sodium--140 mg or less in a serving.  Light in sodium--50% less sodium in a serving. For example, if a food that  usually has 300 mg of sodium is changed to become light in sodium, it will have 150 mg of sodium.  Reduced sodium--25% less sodium in a serving. For example, if a food that usually has 400 mg of sodium is changed to reduced sodium, it will have 300 mg of sodium. WHAT FOODS CAN I EAT? Grains Low-sodium cereals, including oats, puffed wheat and rice, and shredded wheat cereals. Low-sodium crackers. Unsalted rice and pasta. Lower-sodium bread.  Vegetables Frozen or fresh vegetables. Low-sodium or reduced-sodium canned vegetables. Low-sodium or reduced-sodium tomato sauce and paste. Low-sodium or reduced-sodium tomato and vegetable juices.  Fruits Fresh, frozen, and canned fruit. Fruit juice.  Meat and Other Protein Products Low-sodium canned tuna and salmon. Fresh or frozen meat, poultry, seafood, and fish. Lamb. Unsalted nuts.  Dried beans, peas, and lentils without added salt. Unsalted canned beans. Homemade soups without salt. Eggs.  Dairy Milk. Soy milk. Ricotta cheese. Low-sodium or reduced-sodium cheeses. Yogurt.  Condiments Fresh and dried herbs and spices. Salt-free seasonings. Onion and garlic powders. Low-sodium varieties of mustard and ketchup. Lemon juice.  Fats and Oils Reduced-sodium salad dressings. Unsalted butter.  Other Unsalted popcorn and pretzels.  The items listed above may not be a complete list of recommended foods or beverages. Contact your dietitian for more options. WHAT FOODS ARE NOT RECOMMENDED? Grains Instant hot cereals. Bread stuffing, pancake, and biscuit mixes. Croutons. Seasoned rice or pasta mixes. Noodle soup cups. Boxed or frozen macaroni and cheese. Self-rising flour. Regular salted crackers. Vegetables Regular canned vegetables. Regular canned tomato sauce and paste. Regular tomato and vegetable juices. Frozen vegetables in sauces. Salted french fries. Olives. Angie Fava. Relishes. Sauerkraut. Salsa. Meat and Other Protein Products Salted,  canned, smoked, spiced, or pickled meats, seafood, or fish. Bacon, ham, sausage, hot dogs, corned beef, chipped beef, and packaged luncheon meats. Salt pork. Jerky. Pickled herring. Anchovies, regular canned tuna, and sardines. Salted nuts. Dairy Processed cheese and cheese spreads. Cheese curds. Blue cheese and cottage cheese. Buttermilk.  Condiments Onion and garlic salt, seasoned salt, table salt, and sea salt. Canned and packaged gravies. Worcestershire sauce. Tartar sauce. Barbecue sauce. Teriyaki sauce. Soy sauce, including reduced sodium. Steak sauce. Fish sauce. Oyster sauce. Cocktail sauce. Horseradish. Regular ketchup and mustard. Meat flavorings and tenderizers. Bouillon cubes. Hot sauce. Tabasco sauce. Marinades. Taco seasonings. Relishes. Fats and Oils Regular salad dressings. Salted butter. Margarine. Ghee. Bacon fat.  Other Potato and tortilla chips. Corn chips and puffs. Salted popcorn and pretzels. Canned or dried soups. Pizza. Frozen entrees and pot pies.  The items listed above may not be a complete list of foods and beverages to avoid. Contact your dietitian for more information. Document Released: 08/05/2001 Document Revised: 02/18/2013 Document Reviewed: 12/18/2012 [facility] Patient Information 2015 [place], [place]. This information is not intended to replace advice given to you by your health care provider. Make sure you discuss any questions you have with your health care provider.

## 2014-10-29 NOTE — Progress Notes (Signed)
Chief Complaint  Patient presents with  . IPPE    fasting welcome to Medicare visit. Has noticed [place] [name] lot of "liver spots" and would like to check her liver and also her potassium. When she fell 2 years ago you sent her for right knee xray which was neg but is still having pain. Thinks she may have [name] lactose intolerance.    [name] is Lauren [name] 64 y.o. female who presents for Welcome to Medicare physical and follow-up on chronic medical conditions.  She has the following concerns:  She has cramps in her legs at night if she eats an egg or any dairy products.  Denies stomach upset. She has noticed "liver spots" and wants liver checked. Noticing them everywhere.  She sees dermatologist yearly.  Hyperlipidemia/family history of CAD: She had been on Crestor and Lipitor in the past, stating that she only took each of them for about 2-3 weeks. Crestor made her dizzy and lightheaded. Lipitor was "too strong", so dose was lowered. She couldn't tolerate even the lower dose due to muscles and bones aching. She had been seeing Dr. Rex [name] (years ago), had normal stress tests. She tries to follow Lauren [name] low cholesterol diet.  She eats very few eggs (3/month). She cut back on ice cream to once Lauren [name] month, doesn't eat cheese. She eats red meat (meat loaf, spaghetti sauce, lasagna) twice Lauren [name] week. Her cholesterol was high last June, improved on repeat.  We recommended lipid lowering medications only if BP's remained high.  She hasn't been checking BP elsewhere. She denies having any chest pain, dyspnea, edema or palpitations.   She has h/o right trochanteric bursitis, s/p steroid injection 08/04/2013.She still occasionally still has some pain in her right hip and knee in certain positions (ie sitting in certain chairs in restaurants for Lauren [name] long period of time), and if walking on the cement for an hour or two (at work). It hurts for her to rotate her hip out.  Herpes Labialis--infrequently flares. She has been in the sun less, so  didn't take it daily over the summer like she used to, just prn.  Anxiety and irritability is much improved.  Her son no longer lives with her.  If she gets very stressed out, her stomach gets in knots and she will get Lauren [name] fever blister.  Immunization History  Administered Date(s) Administered  . Influenza Split 11/24/2010  . Influenza,inj,Quad PF,36+ Mos 10/27/2013  . Pneumococcal Polysaccharide-23 10/05/2008  . Td 02/28/2000  . Tdap 10/05/2008  . Zoster 11/24/2010   Last Pap smear: 09/2013 Last mammogram: 08/2013 Last colonoscopy: 3/08 Last DEXA: -08 ir -09 at [place], per pt Dentist: twice yearly Ophtho: none since LASIK over 10 years ago Exercise: just through her job, walking the auctions 2x/week (3 hours each) Vitamin D level was normal in 2012  Other doctors caring for patient include: Dentist: Dr. Randol [name] Dermatologist: Dr. Rolm [name] (goes yearly) Cardiologist: Dr. Rex [name] (hasn't seen in many years) GI: Dr. Collene [name]  Depression, fall and ADL screen: all negative--see epic questionnaires.  End of Life Discussion:  Patient has [name] living will and medical power of attorney  Past Medical History  Diagnosis Date  . Hyperlipidemia   . Facial basal cell cancer     nose (Dr. Ubaldo [name]), leg  . Squamous cell skin cancer     many sites (hands/legs)  . Herpes simplex labialis     Past Surgical History  Procedure Laterality Date  . Cardiovascular stress test  06-2005    Dr.  Little  . Cesarean section  x2  . Appendectomy    . Refractive surgery  age 85    Social History   Social History  . Marital Status: Divorced    Spouse Name: N/Lauren [name]  . Number of Children: 2  . Years of Education: N/Lauren [name]   Occupational History  . 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 Smoking status: Never Smoker   . Smokeless tobacco: Never Used  . Alcohol Use: Yes     Comment: 1 drink every 6 months.  . Drug Use: No  . Sexual Activity:    Partners: Male   Other Topics Concern   . Not on file   Social History Narrative   Lives alone. Both sons live in [name]. 2 grandchildren, and 2 great-grandchildren who she watches them 2 days/week and on weekends. She is "semi-retired".    Family History  Problem Relation Age of Onset  . Heart disease Mother 6    MI  . COPD Mother   . Hyperlipidemia Mother   . Hypertension Mother   . Heart disease Sister   . Heart disease Sister   . Hyperlipidemia Sister   . Hypertension Sister   . Diabetes Sister   . Graves' disease Sister   . COPD Sister   . Heart disease Brother     49's  . Heart disease Brother   . COPD Brother   . Alcohol abuse Brother   . Diabetes Brother     borderline  . COPD Brother   . Alcohol abuse Father   . Stroke Father 64  . Diabetes Sister   . Heart disease Sister   . Heart disease Sister     CAD, s/p angioplasties  . Diabetes Sister   . Hyperlipidemia Sister   . Hypertension Sister   . Gout Sister   . Arthritis Sister     Outpatient Encounter Prescriptions as of 10/29/2014  Medication Sig Note  . aspirin 81 MG tablet Take 81 mg by mouth daily.   10/29/2014: .   . cetirizine (ZYRTEC) 10 MG tablet Take 10 mg by mouth daily. 10/29/2014: Uses prn, [place] [name] few weeks ago  . valACYclovir (VALTREX) 500 MG tablet Take 1 tablet by mouth daily for prevention, or 4 tablets at once and repeat in 12 hours (8 total per course) as needed for outbreak (Patient not taking: Reported on 10/29/2014) 10/29/2014: Uses prn flares   No facility-administered encounter medications on file as of 10/29/2014.    No Known Allergies  ROS: The patient denies anorexia, fever, weight changes, headaches, vision changes, decreased hearing, ear pain, sore throat, breast concerns, chest pain, palpitations, dizziness, syncope, dyspnea on exertion, cough, swelling, nausea, vomiting, diarrhea, constipation, abdominal pain, melena, hematochezia, indigestion/heartburn, hematuria, dysuria, vaginal bleeding, discharge, odor or itch, genital lesions,  joint pains, numbness, tingling, weakness, tremor, suspicious skin lesions (recently treated/biopsied by Dr. Ubaldo [name]), depression, anxiety, abnormal bleeding/bruising, or enlarged lymph nodes.  Some leakage of urine with sneeze/cough   PHYSICAL EXAM:  BP 150/90 mmHg  Pulse 72  Ht 5' (1.524 m)  Wt 175 lb (79.379 kg)  BMI 34.18 kg/m2 138/76 on repeat by MD  General Appearance:  Alert, cooperative, no distress, appears stated age   Head:  Normocephalic, without obvious abnormality, atraumatic   Eyes:  PERRL, EOM's intact, fundi benign.   Ears:  Normal TM's and external ear canals   Nose:  Nares normal, mucosa normal, no drainage or sinus tenderness  Throat:  Lips, mucosa, and tongue normal; teeth and gums normal   Neck:  Supple, no lymphadenopathy; thyroid: no enlargement/tenderness/nodules; no carotid  bruit or JVD   Back:  Spine nontender, no curvature, ROM normal, no CVA tenderness   Lungs:  Clear to auscultation bilaterally without wheezes, rales or ronchi; respirations unlabored   Chest Wall:  No tenderness or deformity   Heart:  Regular rate and rhythm, S1 and S2 normal, no murmur, rub  or gallop   Breast Exam:  No tenderness, masses, or nipple discharge or inversion. No axillary lymphadenopathy   Abdomen:  Soft, non-tender, nondistended, normoactive bowel sounds,  no masses, no hepatosplenomegaly   Genitalia:  Normal external genitalia without lesions. BUS and vagina normal; no cervical motion tenderness. No abnormal vaginal discharge. Uterus and adnexa not enlarged, nontender, no masses. Pap not performed   Rectal:  Normal tone, no masses or tenderness; guaiac negative stool   Extremities:  No clubbing, cyanosis or edema. Pain with external rotation of right hip (pain at groin), and limited ROM   Pulses:  2+ and symmetric all extremities   Skin:  Skin color, texture, turgor normal, no lesions. scattered hyperpigmented macules (c/w solar  lentigo) on forearms and lower legs bilaterally.  Hyperkeratotic lesion right dorsum of hand, c/w actinic keratosis. Some purpuric lesions noted on her LE's bilaterally  Lymph nodes:  Cervical, supraclavicular, and axillary nodes normal   Neurologic:  CNII-XII intact, normal strength, sensation and gait; reflexes 2+ and symmetric throughout      Psych: Normal mood, affect, hygiene and grooming.        EKG: NSR 69; prolonged QT noted. Compared to EKG in paper chart from 2007, there are no significant changes, other than the QT (not prolonged in 2007). No ischemic/acute changes.  ASSESSMENT/PLAN:  Need for prophylactic vaccination and inoculation against influenza - Plan: Flu vaccine HIGH DOSE PF (Fluzone High dose)  Welcome to Medicare preventive visit - Plan: POCT Urinalysis Dipstick, Visual acuity screening  Pure hypercholesterolemia - Plan: Lipid panel, Comprehensive metabolic panel  Herpes labialis - continue prn use of Valtrex  Family history of early CAD  Annual physical exam - Plan: Lipid panel, TSH, CBC with Differential/Platelet, Comprehensive metabolic panel  Need for hepatitis C screening test - Plan: Hepatitis C antibody  Screening for HIV (human immunodeficiency virus) - Plan: HIV antibody  Immunization due - Plan: Pneumococcal conjugate vaccine 13-valent  Elevated blood pressure - low sodium diet, regular exercise, weight loss.  check BP elsewhere and f/u if persistently high.  Call/fax results  Right hip pain - pain with abduction, check x-rays - Plan: DG HIP UNILAT WITH PELVIS 2-3 VIEWS RIGHT  Solar lentigo - reassured that this is related to sun exposure, not liver damage  Senile purpura  Actinic keratosis - right hand; routine f/u with derm for treatment   Discussed monthly self breast exams and yearly mammograms; at least 30 minutes of aerobic activity at least 5 days/week, weight-bearing exercise 2x/week; proper sunscreen use  reviewed; healthy diet, including goals of calcium and vitamin D intake and alcohol recommendations (less than or equal to 1 drink/day) reviewed; regular seatbelt use; changing batteries in smoke detectors. Immunization recommendations discussed-- Prevnar and flu shot given today. Colonoscopy recommendations reviewed--UTD   I suspect that you will be started on [name] statin medication.  Given your intolerance to Lipitor, and possible side effect and cost of Crestor, I likely will start with simvastatin.  If you develop any muscle aches from the medication,  please take coenzyme Q10 (over-the-counter) along with it every day (per the package directions).    Cut back on sweet tea encouraged to help with weight loss  Leg cramps--counseled re causes, potassium/Mg (likely normal), adequate hydration, white bar of soap/[facility]  Not lactose intolerant  Elevated blood pressure--counseled. EKG, low sodium diet, regular exercise and weight loss. Return in 2 months, bring list of BP's --f/u on abnl EKG may be needed, await labs   Medicare Attestation I have personally reviewed: The patient's medical and social history Their use of alcohol, tobacco or illicit drugs Their current medications and supplements The patient's functional ability including ADLs,fall risks, home safety risks, cognitive, and hearing and visual impairment Diet and physical activities Evidence for depression or mood disorders  The patient's weight, height, BMI, and visual acuity have been recorded in the chart.  I have made referrals, counseling, and provided education to the patient based on review of the above and I have provided the patient with [place] [name] written personalized care plan for preventive services.     Klay Sobotka A, MD   10/29/2014

## 2014-10-30 LAB — HIV ANTIBODY (ROUTINE TESTING W REFLEX): HIV: NONREACTIVE

## 2014-10-30 LAB — HEPATITIS C ANTIBODY: HCV Ab: NEGATIVE

## 2014-11-13 ENCOUNTER — Ambulatory Visit
Admission: RE | Admit: 2014-11-13 | Discharge: 2014-11-13 | Disposition: A | Discharge status: Home / Self Care | Payer: Medicare Other | Source: Ambulatory Visit | Attending: Family Medicine | Admitting: Family Medicine

## 2014-11-13 DIAGNOSIS — M1611 Unilateral primary osteoarthritis, right hip: Secondary | ICD-10-CM | POA: Diagnosis not present

## 2014-11-13 DIAGNOSIS — M25551 Pain in right hip: Secondary | ICD-10-CM

## 2014-11-13 DIAGNOSIS — Z780 Asymptomatic menopausal state: Secondary | ICD-10-CM | POA: Diagnosis not present

## 2014-11-13 LAB — HM DEXA SCAN

## 2014-11-17 NOTE — Progress Notes (Signed)
Called pt and let her know this. She wants to go ahead and do the referral since the meds aren't helping.

## 2014-12-11 DIAGNOSIS — M25552 Pain in left hip: Secondary | ICD-10-CM | POA: Diagnosis not present

## 2014-12-11 DIAGNOSIS — M25551 Pain in right hip: Secondary | ICD-10-CM | POA: Diagnosis not present

## 2014-12-12 ENCOUNTER — Other Ambulatory Visit: Payer: Self-pay | Admitting: Family Medicine

## 2014-12-12 DIAGNOSIS — B001 Herpesviral vesicular dermatitis: Secondary | ICD-10-CM

## 2014-12-14 NOTE — Telephone Encounter (Signed)
Ok to fill 

## 2014-12-17 ENCOUNTER — Other Ambulatory Visit: Payer: Self-pay | Admitting: *Deleted

## 2014-12-17 DIAGNOSIS — R9431 Abnormal electrocardiogram [ECG] [EKG]: Secondary | ICD-10-CM

## 2014-12-17 DIAGNOSIS — E7800 Pure hypercholesterolemia, unspecified: Secondary | ICD-10-CM

## 2014-12-22 ENCOUNTER — Telehealth: Payer: Self-pay | Admitting: Family Medicine

## 2014-12-22 NOTE — Telephone Encounter (Signed)
Pt called and was concerned about her EKG and didn't know that is was abnormal and was very concerned that is was abnormal. She didn't know that is was abnormal until she made the appt with Ena Dawley, and that they wouldn't clear her for surgery, very upset that no one called her about it, she really wants to talk to someone about this, and also wants to know about her upcoming appt with Houston Siren for a consultation, didn't see how they where going to do that she would really like to talk to you, pt can be reached at [phone]

## 2014-12-22 NOTE — Telephone Encounter (Signed)
Advise pt that there was a slight change in her EKG compared to the only other one available to me, from 2007.  It showed a QT prolongation which is an electrical issue (no sign of any active ischemia or blockage that would warrant immediate evaluation).  That in and of itself, without any symptoms, didn't concern me, except to be noted as certain medications which further prolong the QT should be avoided.  At that time she was seen, we did not know that she was going to be having major surgery.  Based on her family history of heart disease, her age, and her hyperlipidemia, as well as this change in ekg, I prefer her to go back and see a cardiologist for surgical clearance (she previously had a cardiologist).   I apologize if this upset her--usually when we get forms for clearance, OV's are recommended to discuss.  Since she had just had her IPPE, I was trying to prevent her to have to come back to see me to discuss, knowing that I would want her to go back and see her cardiologist.  V--didn't you speak with patient about reasons for referral to cardiology (per my note on her clearance form)?

## 2014-12-23 NOTE — Telephone Encounter (Signed)
Left message for pt to call me back 

## 2014-12-23 NOTE — Telephone Encounter (Signed)
Spoke with patient and she absolutely remembers taking to me. She was calling because when she spoke with person at [facility] the person on the phone had no idea who Dr.Little was and told her they would not get her records. The person also told her that they couldn't do any testing on her since she wasn't a patient there and this was just a consult. She was calling back to see if she could go somewhere else. I called over to them and the person I spoke with apolgized profusely and said they can definitely get the records and is calling patient now. I will also call patient and follow up here shortly.

## 2014-12-23 NOTE — Telephone Encounter (Signed)
Thanks

## 2014-12-24 ENCOUNTER — Telehealth: Payer: Self-pay | Admitting: Cardiology

## 2014-12-24 NOTE — Telephone Encounter (Signed)
Received phone call from Dr Pascal Lux office regarding patient's old records from [place] on 12/23/14.  Patient was a patient of Dr Chase Picket.  Ordered old paper chart from [place] for appointment with Liane Comber on 01/06/15.  Called and advised patient we would get her old cardiology records from storage and get to Dr Francesca Oman office at [facility] prior to her appointment on 01/06/15. lp

## 2014-12-25 DIAGNOSIS — L570 Actinic keratosis: Secondary | ICD-10-CM | POA: Diagnosis not present

## 2014-12-25 DIAGNOSIS — L820 Inflamed seborrheic keratosis: Secondary | ICD-10-CM | POA: Diagnosis not present

## 2014-12-28 ENCOUNTER — Telehealth: Payer: Self-pay | Admitting: Cardiology

## 2014-12-28 NOTE — Telephone Encounter (Signed)
Received [facility] paper chart with patients previous records with Dr Aldona Bar. Sent Chart to [facility] office for appointment on 01/06/15 with Dr Ena Dawley. lp

## 2014-12-29 ENCOUNTER — Telehealth: Payer: Self-pay

## 2014-12-29 NOTE — Telephone Encounter (Signed)
RECEIVE NOTES FROM [facility] 11./.2016

## 2015-01-04 ENCOUNTER — Ambulatory Visit (INDEPENDENT_AMBULATORY_CARE_PROVIDER_SITE_OTHER): Payer: Medicare Other | Admitting: Family Medicine

## 2015-01-04 ENCOUNTER — Encounter: Payer: Self-pay | Admitting: Family Medicine

## 2015-01-04 VITALS — BP 128/80 | HR 88 | Ht 60.0 in | Wt 176.4 lb

## 2015-01-04 DIAGNOSIS — R030 Elevated blood-pressure reading, without diagnosis of hypertension: Secondary | ICD-10-CM | POA: Diagnosis not present

## 2015-01-04 DIAGNOSIS — IMO0001 Reserved for inherently not codable concepts without codable children: Secondary | ICD-10-CM

## 2015-01-04 NOTE — Progress Notes (Signed)
Chief Complaint  Patient presents with  . Hypertension    bp follow up.    BP's have been running 124-125/78-80's when checked elsewhere, since her last visit.  Forgot to bring her list of BP's to today's visit.  Her BP was elevated at her recent physical. She had been on [place] diet, and her sister was bringing breakfast (sausage, country ham)--she since stopped eating these foods.  Denies headaches, dizziness.  On a daily basis, she doesn't have significant pain related to her severe hip arthritis. She will find herself limping only after walking on cement for a long time (auctions).  Only really has pain with external rotation of the hip, which she avoids.  She has cardiologist appointment this week for clearance for surgery, as well as pre-op visit.  PMH, [place], SH reviewed  Outpatient Encounter Prescriptions as of 01/04/2015  Medication Sig Note  . aspirin 81 MG tablet Take 81 mg by mouth daily.   10/29/2014: .   . cetirizine (ZYRTEC) 10 MG tablet Take 10 mg by mouth daily as needed for allergies.    Marland Kitchen triamcinolone cream (KENALOG) 0.1 % APPLY TO LOWER LEGS TWICE A DAY AS NEEDED FOR DRY SKIN 01/04/2015: Received from: External Pharmacy  . valACYclovir (VALTREX) 500 MG tablet TAKE 1 TABLET DAILY FOR PREVENTION.OR 4 TAB AT ONCE AND REPEAT IN 12 HRS(8 TOTAL PER COURSE-OUTBREAK (Patient not taking: Reported on 01/04/2015)    No facility-administered encounter medications on file as of 01/04/2015.   No Known Allergies  ROS: no fever, chills, headaches, dizziness, chest pain, palpitations, shortness of breath, URI symptoms, edema or other complaints except as per HPI (ie hip pain)  PHYSICAL EXAM: BP 128/80 mmHg  Pulse 88  Ht 5' (1.524 m)  Wt 176 lb 6.4 oz (80.015 kg)  BMI 34.45 kg/m2 140/74 on repeat by MD Well developed, pleasant, overweight female in no distress Neck: no lymphadenopathy or mass Heart: regular rate and rhythm without murmur Lungs: clear bilaterally Extremities: no  edema  ASSESSMENT/PLAN:  Elevated blood pressure - improved since cutting back on sodium in diet. cont low Na diet, daily exercise, weight loss rec. f/u with cards as sched  F/u as scheduled for pre-op eval and cardiac clearance. We again reviewed recent EKG She has minimal pain related to her severe hip arthritis, except with certain movements. Therefore, prefer cardiac clearance prior to this elective surgery, given her risks. All questions answered. Low sodium diet and need for regular exercise and weight loss reviewed.

## 2015-01-06 ENCOUNTER — Ambulatory Visit (INDEPENDENT_AMBULATORY_CARE_PROVIDER_SITE_OTHER): Payer: Medicare Other | Admitting: Cardiology

## 2015-01-06 ENCOUNTER — Encounter: Payer: Self-pay | Admitting: Cardiology

## 2015-01-06 VITALS — BP 126/68 | HR 80 | Ht 60.0 in | Wt 176.0 lb

## 2015-01-06 DIAGNOSIS — Z01818 Encounter for other preprocedural examination: Secondary | ICD-10-CM | POA: Diagnosis not present

## 2015-01-06 DIAGNOSIS — Z8249 Family history of ischemic heart disease and other diseases of the circulatory system: Secondary | ICD-10-CM | POA: Diagnosis not present

## 2015-01-06 DIAGNOSIS — E7800 Pure hypercholesterolemia, unspecified: Secondary | ICD-10-CM | POA: Diagnosis not present

## 2015-01-06 DIAGNOSIS — I10 Essential (primary) hypertension: Secondary | ICD-10-CM

## 2015-01-06 MED ORDER — ROSUVASTATIN CALCIUM 10 MG PO TABS: 10.0000 mg | ORAL_TABLET | Freq: Every day | ORAL | Status: DC

## 2015-01-06 NOTE — Progress Notes (Signed)
Patient ID: Lauren [name], female   DOB: [birth year], 64 y.o.   MRN: [id number]      Cardiology Office Note  Date:  01/06/2015   ID:  Lauren [name], DOB Nov 03, 1950, MRN [id number]  PCP:  Vikki Ports, MD  Cardiologist:   Dorothy Spark, MD   Chief complain: preop evaluation    History of Present Illness: Lauren [name] is a 64 y.o. female is a very pleasant female who is coming for preoperative evaluation prior to the B/L hip replacement. She has developed severe arthritis and requires replacement. She is limited in physical activity in certain motions, however still able to work full time. She denies any chest pain. DOE with limited physical activity. No palpitations, syncope, no LE edema, PND. She has a very significant family history of premature CAD.  Her mother died of MI age age 2, father had CVA at 33, she is one of 10 siblings, no one made it beyond age 66 and several died of MI - 2 brothers had MIs in 3', received stents and died. Another sister had MI at age 105 and is alive at age 59.  She is not being treated for hyperlipidemia.    Past Medical History  Diagnosis Date  . Hyperlipidemia   . Facial basal cell cancer     nose (Dr. Ubaldo Glassing), leg  . Squamous cell skin cancer     many sites (hands/legs)  . Herpes simplex labialis     Past Surgical History  Procedure Laterality Date  . Cardiovascular stress test  06-2005    Dr. Rex Kras  . Cesarean section  x2  . Appendectomy    . Refractive surgery  age 64     Current Outpatient Prescriptions  Medication Sig Dispense Refill  . aspirin 81 MG tablet Take 81 mg by mouth daily.      . cetirizine (ZYRTEC) 10 MG tablet Take 10 mg by mouth daily as needed for allergies.     Marland Kitchen triamcinolone cream (KENALOG) 0.1 % APPLY TO LOWER LEGS TWICE A DAY AS NEEDED FOR DRY SKIN  1  . valACYclovir (VALTREX) 500 MG tablet TAKE 1 TABLET DAILY FOR PREVENTION.OR 4 TAB AT ONCE AND REPEAT IN 12 HRS(8 TOTAL PER COURSE-OUTBREAK (Patient not taking:  Reported on 01/04/2015) 30 tablet 4   No current facility-administered medications for this visit.    Allergies:   Review of patient's allergies indicates no known allergies.   Social History:  The patient  reports that she has never smoked. She has never used smokeless tobacco. She reports that she drinks alcohol. She reports that she does not use illicit drugs.   Family History:  The patient's family history includes Alcohol abuse in her brother and father; Arthritis in her sister; COPD in her brother, brother, mother, and sister; Diabetes in her brother, sister, sister, and sister; Gout in her sister; Berenice Primas' disease in her sister; Heart disease in her brother, brother, sister, sister, sister, and sister; Heart disease (age of onset: 104) in her mother; Hyperlipidemia in her mother, sister, and sister; Hypertension in her mother, sister, and sister; Stroke (age of onset: 66) in her father.   ROS:  Please see the history of present illness.   Otherwise, review of systems are positive for none.   All other systems are reviewed and negative.   PHYSICAL EXAM: VS:  Ht 5' (1.524 m) , BMI There is no weight on file to calculate BMI. GEN: Well nourished, well developed, in no acute distress HEENT:  normal Neck: no JVD, carotid bruits, or masses Cardiac: RRR; no murmurs, rubs, or gallops,no edema  Respiratory:  clear to auscultation bilaterally, normal work of breathing GI: soft, nontender, nondistended, + BS MS: no deformity or atrophy Skin: warm and dry, no rash Neuro:  Strength and sensation are intact Psych: euthymic mood, full affect  EKG:  EKG is ordered today. The ekg ordered today demonstrates SR, LAD, otherwise normal ECG.    Recent Labs: 10/29/2014: ALT 17; BUN 11; Creat 0.70; Hemoglobin 13.7; Platelets 297; Potassium 4.5; Sodium 140; TSH 1.128    Lipid Panel    Component Value Date/Time   CHOL 248* 10/29/2014 0001   TRIG 121 10/29/2014 0001   HDL 69 10/29/2014 0001   CHOLHDL  3.6 10/29/2014 0001   VLDL 24 10/29/2014 0001   LDLCALC 155* 10/29/2014 0001   Wt Readings from Last 3 Encounters:  01/04/15 176 lb 6.4 oz (80.015 kg)  10/29/14 175 lb (79.379 kg)  10/27/13 176 lb (79.833 kg)       ASSESSMENT AND PLAN:  1.  Preoperative CV evaluation - considering severe and tragic FH of premature CAD I will schedule a Lexiscan nuclear stress test. If normal she is ok to undergo her hip replacement.  2. Hyperlipidemia - LDL 155 - she needs to be treated, I will start rosuvastatin, it is ok to start post surgery, check LFTs in 2 months.  Follow up in 2 weeks.   Signed, Dorothy Spark, MD  01/06/2015 2:04 PM    [facility] [address], [place], [place]  [address] Phone: [phone]; Fax: [phone]

## 2015-01-06 NOTE — Patient Instructions (Signed)
Medication Instructions:   START TAKING ROSUVASTATIN 10 MG ONCE DAILY    Labwork:  2 MONTHS AT OUR OFFICE TO CHECK A ---CMET     Testing/Procedures:  Your physician has requested that you have a lexiscan myoview. For further information please visit [email or web address]. Please follow instruction sheet, as given.     Follow-Up:  Your physician wants you to follow-up in: [place] will receive a reminder letter in the mail two months in advance. If you don't receive a letter, please call our office to schedule the follow-up appointment.        If you need a refill on your cardiac medications before your next appointment, please call your pharmacy.

## 2015-01-07 ENCOUNTER — Telehealth (HOSPITAL_COMMUNITY): Payer: Self-pay

## 2015-01-07 NOTE — Telephone Encounter (Signed)
Encounter complete. 

## 2015-01-08 ENCOUNTER — Ambulatory Visit (HOSPITAL_COMMUNITY)
Admission: RE | Admit: 2015-01-08 | Discharge: 2015-01-08 | Disposition: A | Discharge status: Home / Self Care | Payer: Medicare Other | Source: Ambulatory Visit | Attending: Cardiology | Admitting: Cardiology

## 2015-01-08 DIAGNOSIS — Z01818 Encounter for other preprocedural examination: Secondary | ICD-10-CM

## 2015-01-08 DIAGNOSIS — I10 Essential (primary) hypertension: Secondary | ICD-10-CM

## 2015-01-08 DIAGNOSIS — Z8249 Family history of ischemic heart disease and other diseases of the circulatory system: Secondary | ICD-10-CM | POA: Diagnosis not present

## 2015-01-08 DIAGNOSIS — E7800 Pure hypercholesterolemia, unspecified: Secondary | ICD-10-CM

## 2015-01-08 DIAGNOSIS — E663 Overweight: Secondary | ICD-10-CM | POA: Diagnosis not present

## 2015-01-08 DIAGNOSIS — Z6834 Body mass index (BMI) 34.0-34.9, adult: Secondary | ICD-10-CM | POA: Diagnosis not present

## 2015-01-08 LAB — MYOCARDIAL PERFUSION IMAGING
LV dias vol: 77 mL
LV sys vol: 23 mL
Peak HR: 100 {beats}/min
Rest HR: 71 {beats}/min
SDS: 5
SRS: 0
SSS: 5
TID: 1.13

## 2015-01-08 MED ORDER — TECHNETIUM TC 99M SESTAMIBI GENERIC - CARDIOLITE
10.9000 | Freq: Once | INTRAVENOUS | Status: AC | PRN
  Administered 2015-01-08: 10.9 via INTRAVENOUS

## 2015-01-08 MED ORDER — TECHNETIUM TC 99M SESTAMIBI GENERIC - CARDIOLITE
31.8000 | Freq: Once | INTRAVENOUS | Status: AC | PRN
  Administered 2015-01-08: 31.8 via INTRAVENOUS

## 2015-01-08 MED ORDER — REGADENOSON 0.4 MG/5ML IV SOLN
0.4000 mg | Freq: Once | INTRAVENOUS | Status: AC
  Administered 2015-01-08: 0.4 mg via INTRAVENOUS

## 2015-01-11 ENCOUNTER — Encounter: Payer: Self-pay | Admitting: *Deleted

## 2015-01-11 ENCOUNTER — Encounter (HOSPITAL_COMMUNITY)
Admission: RE | Admit: 2015-01-11 | Discharge: 2015-01-11 | Disposition: A | Discharge status: Home / Self Care | Payer: Medicare Other | Source: Ambulatory Visit | Attending: Orthopedic Surgery | Admitting: Orthopedic Surgery

## 2015-01-11 ENCOUNTER — Encounter (HOSPITAL_COMMUNITY): Payer: Self-pay

## 2015-01-11 ENCOUNTER — Telehealth: Payer: Self-pay | Admitting: *Deleted

## 2015-01-11 DIAGNOSIS — Z01812 Encounter for preprocedural laboratory examination: Secondary | ICD-10-CM | POA: Insufficient documentation

## 2015-01-11 HISTORY — DX: Unspecified osteoarthritis, unspecified site: M19.90

## 2015-01-11 HISTORY — DX: Malignant melanoma of skin, unspecified: C43.9

## 2015-01-11 LAB — URINALYSIS, ROUTINE W REFLEX MICROSCOPIC
BILIRUBIN URINE: NEGATIVE
GLUCOSE, UA: NEGATIVE mg/dL
HGB URINE DIPSTICK: NEGATIVE
KETONES UR: NEGATIVE mg/dL
LEUKOCYTES UA: NEGATIVE
Nitrite: NEGATIVE
PH: 7 (ref 5.0–8.0)
PROTEIN: NEGATIVE mg/dL
Specific Gravity, Urine: 1.009 (ref 1.005–1.030)
Urobilinogen, UA: 0.2 mg/dL (ref 0.0–1.0)

## 2015-01-11 LAB — PROTIME-INR
INR: 0.98 (ref 0.00–1.49)
Prothrombin Time: 13.2 seconds (ref 11.6–15.2)

## 2015-01-11 LAB — CBC
HEMATOCRIT: 40.4 % (ref 36.0–46.0)
HEMOGLOBIN: 13.5 g/dL (ref 12.0–15.0)
MCH: 29.8 pg (ref 26.0–34.0)
MCHC: 33.4 g/dL (ref 30.0–36.0)
MCV: 89.2 fL (ref 78.0–100.0)
Platelets: 285 10*3/uL (ref 150–400)
RBC: 4.53 MIL/uL (ref 3.87–5.11)
RDW: 13.6 % (ref 11.5–15.5)
WBC: 4.6 10*3/uL (ref 4.0–10.5)

## 2015-01-11 LAB — BASIC METABOLIC PANEL
ANION GAP: 6 (ref 5–15)
BUN: 14 mg/dL (ref 6–20)
CALCIUM: 9.4 mg/dL (ref 8.9–10.3)
CO2: 28 mmol/L (ref 22–32)
Chloride: 107 mmol/L (ref 101–111)
Creatinine, Ser: 0.69 mg/dL (ref 0.44–1.00)
GLUCOSE: 95 mg/dL (ref 65–99)
POTASSIUM: 4.6 mmol/L (ref 3.5–5.1)
Sodium: 141 mmol/L (ref 135–145)

## 2015-01-11 LAB — ABO/RH: ABO/RH(D): A POS

## 2015-01-11 LAB — APTT: aPTT: 29 seconds (ref 24–37)

## 2015-01-11 LAB — SURGICAL PCR SCREEN
MRSA, PCR: NEGATIVE
Staphylococcus aureus: POSITIVE — AB

## 2015-01-11 NOTE — H&P (Signed)
TOTAL HIP ADMISSION H&P  Patient is admitted for right total hip arthroplasty, anterior approach.  Subjective:  Chief Complaint:     Right hip primary OA / pain  HPI: Lauren [name], 64 y.o. female, has a history of pain and functional disability in the right hip(s) due to arthritis and patient has failed non-surgical conservative treatments for greater than 12 weeks to include NSAID's and/or analgesics and activity modification.  Onset of symptoms was gradual starting ~3 years ago with gradually worsening course since that time.The patient noted no past surgery on the right hip(s).  Patient currently rates pain in the right hip at 10 out of 10 with activity. Patient has night pain, worsening of pain with activity and weight bearing, trendelenberg gait, pain that interfers with activities of daily living and pain with passive range of motion. Patient has evidence of periarticular osteophytes and joint space narrowing by imaging studies. This condition presents safety issues increasing the risk of falls.  There is no current active infection.  Risks, benefits and expectations were discussed with the patient.  Risks including but not limited to the risk of anesthesia, blood clots, nerve damage, blood vessel damage, failure of the prosthesis, infection and up to and including death.  Patient understand the risks, benefits and expectations and wishes to proceed with surgery.   PCP: Rita Ohara A, MD  D/C Plans:      Home with HHPT  Post-op Meds:       No Rx given  Tranexamic Acid:      To be given - IV  Decadron:      Is to be given  FYI:     ASA post-op  Norco post-op    Patient Active Problem List   Diagnosis Date Noted  . Pre-op evaluation 01/06/2015  . HTN (hypertension) 01/06/2015  . Family history of early CAD 10/29/2014  . Pure hypercholesterolemia 11/24/2010  . Herpes labialis 11/24/2010  . Bunion of right foot 03/30/2006   Past Medical History  Diagnosis Date  . Hyperlipidemia    . Herpes simplex labialis   . Arthritis     hips-Right worse and right knee.  . Facial basal cell cancer     nose (Dr. Ubaldo Glassing), leg  . Squamous cell skin cancer     many sites (hands/legs)  . Melanoma ([place])     right shoulder- 10 yrs ago- no further issues    Past Surgical History  Procedure Laterality Date  . Cardiovascular stress test  06-2005    Dr. Rex Kras  . Cesarean section  x2  . Appendectomy    . Refractive surgery  age 53  . Refractive surgery      No prescriptions prior to admission   Allergies  Allergen Reactions  . Morphine And Related     Patient prefers not to received"became addictive to this many years ago"- Demerol is okay    Social History  Substance Use Topics  . Smoking status: Never Smoker   . Smokeless tobacco: Never Used  . Alcohol Use: Yes     Comment: 1 drink every 6 months.    Family History  Problem Relation Age of Onset  . Heart disease Mother 28    MI  . COPD Mother   . Hyperlipidemia Mother   . Hypertension Mother   . Heart disease Sister   . Heart disease Sister   . Hyperlipidemia Sister   . Hypertension Sister   . Diabetes Sister   . Graves' disease Sister   .  COPD Sister   . Heart disease Brother     23's  . Heart disease Brother   . COPD Brother   . Alcohol abuse Brother   . Diabetes Brother     borderline  . COPD Brother   . Alcohol abuse Father   . Stroke Father 7  . Diabetes Sister   . Heart disease Sister   . Heart disease Sister     CAD, s/p angioplasties  . Diabetes Sister   . Hyperlipidemia Sister   . Hypertension Sister   . Gout Sister   . Arthritis Sister      Review of Systems  Constitutional: Negative.   HENT: Negative.   Eyes: Negative.   Respiratory: Negative.   Cardiovascular: Negative.   Gastrointestinal: Negative.   Genitourinary: Negative.   Musculoskeletal: Positive for back pain and joint pain.  Skin: Negative.   Neurological: Negative.   Endo/Heme/Allergies: Positive for environmental  allergies.  Psychiatric/Behavioral: Negative.     Objective:  Physical Exam  Constitutional: She is oriented to person, place, and time. She appears well-developed and well-nourished.  HENT:  Head: Normocephalic and atraumatic.  Eyes: Pupils are equal, round, and reactive to light.  Neck: Neck supple. No JVD present. No tracheal deviation present. No thyromegaly present.  Cardiovascular: Normal rate, regular rhythm, normal heart sounds and intact distal pulses.   Respiratory: Effort normal and breath sounds normal. No stridor. No respiratory distress. She has no wheezes.  GI: Soft. There is no tenderness. There is no guarding.  Musculoskeletal:       Right hip: She exhibits decreased range of motion, decreased strength, tenderness and bony tenderness. She exhibits no swelling, no deformity and no laceration.  Lymphadenopathy:    She has no cervical adenopathy.  Neurological: She is alert and oriented to person, place, and time.  Skin: Skin is warm and dry.  Psychiatric: She has a normal mood and affect.    Vital signs in last 24 hours: Temp:  [98.4 F (36.9 C)] 98.4 F (36.9 C) (11/14 1046) Pulse Rate:  [70] 70 (11/14 1046) Resp:  [16] 16 (11/14 1046) BP: (134)/(68) 134/68 mmHg (11/14 1046) SpO2:  [98 %] 98 % (11/14 1046) Weight:  [79.379 kg (175 lb)] 79.379 kg (175 lb) (11/14 1046)  Labs:   Estimated body mass index is 34.18 kg/(m^2) as calculated from the following:   Height as of 10/29/14: 5' (1.524 m).   Weight as of 10/29/14: 79.379 kg (175 lb).   Imaging Review Plain radiographs demonstrate severe degenerative joint disease of the right hip(s). The bone quality appears to be good for age and reported activity level.  Assessment/Plan:  End stage arthritis, right hip(s)  The patient history, physical examination, clinical judgement of the provider and imaging studies are consistent with end stage degenerative joint disease of the right hip(s) and total hip  arthroplasty is deemed medically necessary. The treatment options including medical management, injection therapy, arthroscopy and arthroplasty were discussed at length. The risks and benefits of total hip arthroplasty were presented and reviewed. The risks due to aseptic loosening, infection, stiffness, dislocation/subluxation,  thromboembolic complications and other imponderables were discussed.  The patient acknowledged the explanation, agreed to proceed with the plan and consent was signed. Patient is being admitted for inpatient treatment for surgery, pain control, PT, OT, prophylactic antibiotics, VTE prophylaxis, progressive ambulation and ADL's and discharge planning.The patient is planning to be discharged home with home health services.     [place] Pugh Dean Goldner  PA-C  01/11/2015, 12:30 PM

## 2015-01-11 NOTE — Telephone Encounter (Signed)
Notified the pt that per Dr Meda Coffee her stress test was normal, with normal LVEF and no ischemia.  Informed the pt that I will fax her clearance letter per Dr Meda Coffee to Dr Aurea Graff office with [facility] ATTN: Judeen Hammans surgery scheduler for Dr Alvan Dame.  Fax # [phone].  This is for the pt to receive her hip replacement.  Pt request that the clearance letter also be faxed to her at [phone], to have this on-hand.  Informed the pt that I will fax this both to her and Dr Alvan Dame.  Pt verbalized understanding, agrees with this plan, and gracious for all the assistance provided.

## 2015-01-11 NOTE — Telephone Encounter (Signed)
-----   Message from Lauren Spark, MD sent at 01/11/2015  8:18 AM EST ----- Normal stress test, normal LVEF and no ischemia. Please write her a clearance letter for hip rplacement. Thank you, KN

## 2015-01-11 NOTE — Pre-Procedure Instructions (Signed)
01-11-15 EKG 01-06-15, Stress 01-08-15 Epic.

## 2015-01-11 NOTE — Patient Instructions (Addendum)
[place]  01/11/2015   Your procedure is scheduled on:   01-19-2015 Tuesday  Enter through [facility]  Entrance and follow signs to [facility] to [place]. Arrive at    0515    AM .  (Limit 1 person with you).  Call this number if you have problems the morning of surgery: [phone]  Or Presurgical Testing [phone] days before.   For Living Will and/or Health Care Power Attorney Forms: please provide copy for your medical record,may bring AM of surgery(Forms should be already notarized -we do not provide this service).(01-11-15 Yes/ No information preferred today).   For Cpap use: Bring mask and tubing only.   Do not eat food/ or drink: After Midnight.      Take these medicines the morning of surgery with A SIP OF WATER-   (DO NOT TAKE ANY DIABETIC MEDS AM OF SURGERY) : Crestor. Cetirizine(if need).   Do not wear jewelry, make-up or nail polish.  Do not wear deodorant, lotions, powders, or perfumes.   Do not shave legs and under arms- 48 hours(2 days) prior to first CHG shower.(Shaving face and neck okay.)  Do not bring valuables to the hospital.(Hospital is not responsible for lost valuables).  Contacts, dentures or removable bridgework, body piercing, hair pins may not be worn into surgery.  Leave suitcase in the car. After surgery it may be brought to your room.  For patients admitted to the hospital, checkout time is 11:00 AM the day of discharge.(Restricted visitors-Any Persons displaying flu-like symptoms or illness).    Patients discharged the day of surgery will not be allowed to drive home. Must have responsible person with you x 24 hours once discharged.  Name and phone number of your driver: Lauren [name], sister [phone]     Please read over the following fact sheets that you were given:  CHG(Chlorhexidine Gluconate 4% Surgical Soap) use, MRSA Information, Blood Transfusion fact sheet, Incentive Spirometry  Instruction.  Remember : Type/Screen "Blue armbands" - may not be removed once applied(would result in being retested AM of surgery, if removed).         [facility] - Preparing for Surgery Before surgery, you can play an important role.  Because skin is not sterile, your skin needs to be as free of germs as possible.  You can reduce the number of germs on your skin by washing with CHG (chlorahexidine gluconate) soap before surgery.  CHG is an antiseptic cleaner which kills germs and bonds with the skin to continue killing germs even after washing. Please DO NOT use if you have an allergy to CHG or antibacterial soaps.  If your skin becomes reddened/irritated stop using the CHG and inform your nurse when you arrive at Short Stay. Do not shave (including legs and underarms) for at least 48 hours prior to the first CHG shower.  You may shave your face/neck. Please follow these instructions carefully:  1.  Shower with CHG Soap the night before surgery and the  morning of Surgery.  2.  If you choose to wash your hair, wash your hair first as usual with your  normal  shampoo.  3.  After you shampoo, rinse your hair and body thoroughly to remove the  shampoo.                           4.  Use CHG as you would any other liquid soap.  You can apply chg directly  to the skin and wash                       Gently with a scrungie or clean washcloth.  5.  Apply the CHG Soap to your body ONLY FROM THE NECK DOWN.   Do not use on face/ open                           Wound or open sores. Avoid contact with eyes, ears mouth and genitals (private parts).                       Wash face,  Genitals (private parts) with your normal soap.             6.  Wash thoroughly, paying special attention to the area where your surgery  will be performed.  7.  Thoroughly rinse your body with warm water from the neck down.  8.  DO NOT shower/wash with your normal soap after using and rinsing off  the CHG Soap.                 9.  Pat yourself dry with a clean towel.            10.  Wear clean pajamas.            11.  Place clean sheets on your bed the night of your first shower and do not  sleep with pets. Day of Surgery : Do not apply any lotions/deodorants the morning of surgery.  Please wear clean clothes to the hospital/surgery center.  FAILURE TO FOLLOW THESE INSTRUCTIONS MAY RESULT IN THE CANCELLATION OF YOUR SURGERY PATIENT SIGNATURE_________________________________  NURSE SIGNATURE__________________________________  ________________________________________________________________________   Lauren [name]  An incentive spirometer is a tool that can help keep your lungs clear and active. This tool measures how well you are filling your lungs with each breath. Taking long deep breaths may help reverse or decrease the chance of developing breathing (pulmonary) problems (especially infection) following:  A long period of time when you are unable to move or be active. BEFORE THE PROCEDURE   If the spirometer includes an indicator to show your best effort, your nurse or respiratory therapist will set it to a desired goal.  If possible, sit up straight or lean slightly forward. Try not to slouch.  Hold the incentive spirometer in an upright position. INSTRUCTIONS FOR USE   Sit on the edge of your bed if possible, or sit up as far as you can in bed or on a chair.  Hold the incentive spirometer in an upright position.  Breathe out normally.  Place the mouthpiece in your mouth and seal your lips tightly around it.  Breathe in slowly and as deeply as possible, raising the piston or the ball toward the top of the column.  Hold your breath for 3-5 seconds or for as long as possible. Allow the piston or ball to fall to the bottom of the column.  Remove the mouthpiece from your mouth and breathe out normally.  Rest for a few seconds and repeat Steps 1 through 7 at least 10 times every 1-2 hours  when you are awake. Take your time and take a few normal breaths between deep breaths.  The spirometer may include an indicator to show your best effort. Use the indicator as a goal to work  toward during each repetition.  After each set of 10 deep breaths, practice coughing to be sure your lungs are clear. If you have an incision (the cut made at the time of surgery), support your incision when coughing by placing a pillow or rolled up towels firmly against it. Once you are able to get out of bed, walk around indoors and cough well. You may stop using the incentive spirometer when instructed by your caregiver.  RISKS AND COMPLICATIONS  Take your time so you do not get dizzy or light-headed.  If you are in pain, you may need to take or ask for pain medication before doing incentive spirometry. It is harder to take a deep breath if you are having pain. AFTER USE  Rest and breathe slowly and easily.  It can be helpful to keep track of a log of your progress. Your caregiver can provide you with a simple table to help with this. If you are using the spirometer at home, follow these instructions: [place] IF:   You are having difficultly using the spirometer.  You have trouble using the spirometer as often as instructed.  Your pain medication is not giving enough relief while using the spirometer.  You develop fever of 100.5 F (38.1 C) or higher. SEEK IMMEDIATE MEDICAL CARE IF:   You cough up bloody sputum that had not been present before.  You develop fever of 102 F (38.9 C) or greater.  You develop worsening pain at or near the incision site. MAKE SURE YOU:   Understand these instructions.  Will watch your condition.  Will get help right away if you are not doing well or get worse. Document Released: 06/26/2006 Document Revised: 05/08/2011 Document Reviewed: 08/27/2006 ExitCare Patient Information 2014 ExitCare,  [place].   ________________________________________________________________________  WHAT IS A BLOOD TRANSFUSION? Blood Transfusion Information  A transfusion is the replacement of blood or some of its parts. Blood is made up of multiple cells which provide different functions.  Red blood cells carry oxygen and are used for blood loss replacement.  White blood cells fight against infection.  Platelets control bleeding.  Plasma helps clot blood.  Other blood products are available for specialized needs, such as hemophilia or other clotting disorders. BEFORE THE TRANSFUSION  Who gives blood for transfusions?   Healthy volunteers who are fully evaluated to make sure their blood is safe. This is blood bank blood. Transfusion therapy is the safest it has ever been in the practice of medicine. Before blood is taken from a donor, a complete history is taken to make sure that person has no history of diseases nor engages in risky social behavior (examples are intravenous drug use or sexual activity with multiple partners). The donor's travel history is screened to minimize risk of transmitting infections, such as malaria. The donated blood is tested for signs of infectious diseases, such as HIV and hepatitis. The blood is then tested to be sure it is compatible with you in order to minimize the chance of a transfusion reaction. If you or a relative donates blood, this is often done in anticipation of surgery and is not appropriate for emergency situations. It takes many days to process the donated blood. RISKS AND COMPLICATIONS Although transfusion therapy is very safe and saves many lives, the main dangers of transfusion include:   Getting an infectious disease.  Developing a transfusion reaction. This is an allergic reaction to something in the blood you were given. Every precaution is taken to  prevent this. The decision to have a blood transfusion has been considered carefully by your caregiver  before blood is given. Blood is not given unless the benefits outweigh the risks. AFTER THE TRANSFUSION  Right after receiving a blood transfusion, you will usually feel much better and more energetic. This is especially true if your red blood cells have gotten low (anemic). The transfusion raises the level of the red blood cells which carry oxygen, and this usually causes an energy increase.  The nurse administering the transfusion will monitor you carefully for complications. HOME CARE INSTRUCTIONS  No special instructions are needed after a transfusion. You may find your energy is better. Speak with your caregiver about any limitations on activity for underlying diseases you may have. SEEK MEDICAL CARE IF:   Your condition is not improving after your transfusion.  You develop redness or irritation at the intravenous (IV) site. SEEK IMMEDIATE MEDICAL CARE IF:  Any of the following symptoms occur over the next 12 hours:  Shaking chills.  You have a temperature by mouth above 102 F (38.9 C), not controlled by medicine.  Chest, back, or muscle pain.  People around you feel you are not acting correctly or are confused.  Shortness of breath or difficulty breathing.  Dizziness and fainting.  You get a rash or develop hives.  You have a decrease in urine output.  Your urine turns a dark color or changes to pink, red, or brown. Any of the following symptoms occur over the next 10 days:  You have a temperature by mouth above 102 F (38.9 C), not controlled by medicine.  Shortness of breath.  Weakness after normal activity.  The white part of the eye turns yellow (jaundice).  You have a decrease in the amount of urine or are urinating less often.  Your urine turns a dark color or changes to pink, red, or brown. Document Released: 02/11/2000 Document Revised: 05/08/2011 Document Reviewed: 09/30/2007 [facility] Patient Information 2014 [place],  [place].  _______________________________________________________________________

## 2015-01-11 NOTE — Progress Notes (Signed)
01-11-15 1615 Pt notified of Positive Staph aureus PCR- to use Mupirocin as directed- RX. Called to [place]- [phone] and pt. Aware. Note per Epic fax to [phone].

## 2015-01-14 ENCOUNTER — Encounter: Payer: Self-pay | Admitting: Family Medicine

## 2015-01-14 ENCOUNTER — Ambulatory Visit (INDEPENDENT_AMBULATORY_CARE_PROVIDER_SITE_OTHER): Payer: Medicare Other | Admitting: Family Medicine

## 2015-01-14 VITALS — BP 128/76 | HR 64 | Temp 98.2°F | Wt 176.6 lb

## 2015-01-14 DIAGNOSIS — J069 Acute upper respiratory infection, unspecified: Secondary | ICD-10-CM

## 2015-01-14 MED ORDER — PROMETHAZINE-DM 6.25-15 MG/5ML PO SYRP: 5.0000 mL | ORAL_SOLUTION | Freq: Every evening | ORAL | Status: DC | PRN

## 2015-01-14 NOTE — Progress Notes (Signed)
   Subjective:    Patient ID: Lauren [name], female    DOB: 10-29-1950, 64 y.o.   MRN: [id number]  HPI Chief Complaint  Patient presents with  . cold    cold starting feeling sick yesterday like larynitits, and woke up this morning, congested, cough. no fever. eyes hurt. otc zrytec, clartin D. not sure if its cold or allergies. feeling worse today.    She is here for approximately 24 hour history of hoarseness, tickle in throat, and dry cough. She states she is scheduled to have a hip replacement next Tuesday and would like for me to give her something to get her over this illness. Does not smoke. Denies having underlying allergies. States she takes care of 2 grandchildren at home and they have both been sick with similar symptoms. Denies fever, chills, body aches, ear pain, shortness of breath, or GI symptoms.  Reviewed allergies, medications, past medical and social history.  Review of Systems Pertinent positives and negatives in the history of present illness.    Objective:   Physical Exam  Constitutional: She appears well-developed and well-nourished. No distress.  HENT:  Right Ear: Tympanic membrane and ear canal normal.  Left Ear: Tympanic membrane and ear canal normal.  Mouth/Throat: Uvula is midline, oropharynx is clear and moist and mucous membranes are normal. No oropharyngeal exudate, posterior oropharyngeal edema or posterior oropharyngeal erythema.  Eyes: Conjunctivae are normal.  Cardiovascular: Normal rate, regular rhythm and normal heart sounds.   Pulmonary/Chest: Effort normal and breath sounds normal. She has no decreased breath sounds. She has no wheezes. She has no rhonchi.  Skin: Skin is warm and dry. No cyanosis. No pallor. Nails show no clubbing.       Assessment & Plan:  Acute upper respiratory infection  Discussed that her symptoms appear to be from a viral etiology. Recommend symptomatic treatment and prescription for cough medicine sent to pharmacy to be  used as needed at night for cough. Recommend staying well hydrated. Discussed that I understand her sense of urgency and being concerned that current illness might prevent her from having her hip replacement next Tuesday, however, there is no cure that I can recommend today that will speed up the viral process. Recommend that she call the surgery Center and let them know about her symptoms and that most likely this will not prevent her from having her surgery, unless her symptoms worsen. She expressed dissatisfaction with the plan however she is in agreement. Prescription for nighttime cough medication sent to her pharmacy with instructions and risks/side effects.

## 2015-01-14 NOTE — Patient Instructions (Signed)
Upper Respiratory Infection, Adult Most upper respiratory infections (URIs) are a viral infection of the air passages leading to the lungs. A URI affects the nose, throat, and upper air passages. The most common type of URI is nasopharyngitis and is typically referred to as "the common cold." URIs run their course and usually go away on their own. Most of the time, a URI does not require medical attention, but sometimes a bacterial infection in the upper airways can follow a viral infection. This is called a secondary infection. Sinus and middle ear infections are common types of secondary upper respiratory infections. Bacterial pneumonia can also complicate a URI. A URI can worsen asthma and chronic obstructive pulmonary disease (COPD). Sometimes, these complications can require emergency medical care and may be life threatening.  CAUSES Almost all URIs are caused by viruses. A virus is a type of germ and can spread from one person to another.  RISKS FACTORS You may be at risk for a URI if:   You smoke.   You have chronic heart or lung disease.  You have a weakened defense (immune) system.   You are very young or very old.   You have nasal allergies or asthma.  You work in crowded or poorly ventilated areas.  You work in health care facilities or schools. SIGNS AND SYMPTOMS  Symptoms typically develop 2-3 days after you come in contact with a cold virus. Most viral URIs last 7-10 days. However, viral URIs from the influenza virus (flu virus) can last 14-18 days and are typically more severe. Symptoms may include:   Runny or stuffy (congested) nose.   Sneezing.   Cough.   Sore throat.   Headache.   Fatigue.   Fever.   Loss of appetite.   Pain in your forehead, behind your eyes, and over your cheekbones (sinus pain).  Muscle aches.  DIAGNOSIS  Your health care provider may diagnose a URI by:  Physical exam.  Tests to check that your symptoms are not due to  another condition such as:  Strep throat.  Sinusitis.  Pneumonia.  Asthma. TREATMENT  A URI goes away on its own with time. It cannot be cured with medicines, but medicines may be prescribed or recommended to relieve symptoms. Medicines may help:  Reduce your fever.  Reduce your cough.  Relieve nasal congestion. HOME CARE INSTRUCTIONS   Take medicines only as directed by your health care provider.   Gargle warm saltwater or take cough drops to comfort your throat as directed by your health care provider.  Use a warm mist humidifier or inhale steam from a shower to increase air moisture. This may make it easier to breathe.  Drink enough fluid to keep your urine clear or pale yellow.   Eat soups and other clear broths and maintain good nutrition.   Rest as needed.   Return to work when your temperature has returned to normal or as your health care provider advises. You may need to stay home longer to avoid infecting others. You can also use a face mask and careful hand washing to prevent spread of the virus.  Increase the usage of your inhaler if you have asthma.   Do not use any tobacco products, including cigarettes, chewing tobacco, or electronic cigarettes. If you need help quitting, ask your health care provider. PREVENTION  The best way to protect yourself from getting a cold is to practice good hygiene.   Avoid oral or hand contact with people with cold   symptoms.   Wash your hands often if contact occurs.  There is no clear evidence that vitamin C, vitamin E, echinacea, or exercise reduces the chance of developing a cold. However, it is always recommended to get plenty of rest, exercise, and practice good nutrition.  SEEK MEDICAL CARE IF:   You are getting worse rather than better.   Your symptoms are not controlled by medicine.   You have chills.  You have worsening shortness of breath.  You have brown or red mucus.  You have yellow or brown nasal  discharge.  You have pain in your face, especially when you bend forward.  You have a fever.  You have swollen neck glands.  You have pain while swallowing.  You have white areas in the back of your throat. SEEK IMMEDIATE MEDICAL CARE IF:   You have severe or persistent:  Headache.  Ear pain.  Sinus pain.  Chest pain.  You have chronic lung disease and any of the following:  Wheezing.  Prolonged cough.  Coughing up blood.  A change in your usual mucus.  You have a stiff neck.  You have changes in your:  Vision.  Hearing.  Thinking.  Mood. MAKE SURE YOU:   Understand these instructions.  Will watch your condition.  Will get help right away if you are not doing well or get worse.   This information is not intended to replace advice given to you by your health care provider. Make sure you discuss any questions you have with your health care provider.   Document Released: 08/09/2000 Document Revised: 06/30/2014 Document Reviewed: 05/21/2013 Elsevier Interactive Patient Education 2016 Elsevier Inc.  

## 2015-01-19 ENCOUNTER — Inpatient Hospital Stay (HOSPITAL_COMMUNITY): Payer: Medicare Other | Admitting: Anesthesiology

## 2015-01-19 ENCOUNTER — Inpatient Hospital Stay (HOSPITAL_COMMUNITY)
Admission: RE | Admit: 2015-01-19 | Discharge: 2015-01-20 | DRG: 470 | Disposition: A | Discharge status: Home Health | Payer: Medicare Other | Source: Ambulatory Visit | Attending: Orthopedic Surgery | Admitting: Orthopedic Surgery

## 2015-01-19 ENCOUNTER — Encounter (HOSPITAL_COMMUNITY)
Admission: RE | Disposition: A | Discharge status: Home Health | Payer: Self-pay | Source: Ambulatory Visit | Attending: Orthopedic Surgery

## 2015-01-19 ENCOUNTER — Encounter (HOSPITAL_COMMUNITY): Payer: Self-pay | Admitting: *Deleted

## 2015-01-19 ENCOUNTER — Inpatient Hospital Stay (HOSPITAL_COMMUNITY): Payer: Medicare Other

## 2015-01-19 DIAGNOSIS — Z471 Aftercare following joint replacement surgery: Secondary | ICD-10-CM | POA: Diagnosis not present

## 2015-01-19 DIAGNOSIS — E669 Obesity, unspecified: Secondary | ICD-10-CM | POA: Diagnosis present

## 2015-01-19 DIAGNOSIS — M25551 Pain in right hip: Secondary | ICD-10-CM | POA: Diagnosis present

## 2015-01-19 DIAGNOSIS — Z96649 Presence of unspecified artificial hip joint: Secondary | ICD-10-CM

## 2015-01-19 DIAGNOSIS — Z01812 Encounter for preprocedural laboratory examination: Secondary | ICD-10-CM | POA: Diagnosis not present

## 2015-01-19 DIAGNOSIS — M169 Osteoarthritis of hip, unspecified: Secondary | ICD-10-CM | POA: Diagnosis not present

## 2015-01-19 DIAGNOSIS — M1611 Unilateral primary osteoarthritis, right hip: Principal | ICD-10-CM | POA: Diagnosis present

## 2015-01-19 DIAGNOSIS — Z85820 Personal history of malignant melanoma of skin: Secondary | ICD-10-CM

## 2015-01-19 DIAGNOSIS — I10 Essential (primary) hypertension: Secondary | ICD-10-CM | POA: Diagnosis present

## 2015-01-19 DIAGNOSIS — Z96641 Presence of right artificial hip joint: Secondary | ICD-10-CM | POA: Diagnosis not present

## 2015-01-19 DIAGNOSIS — Z6835 Body mass index (BMI) 35.0-35.9, adult: Secondary | ICD-10-CM

## 2015-01-19 HISTORY — PX: TOTAL HIP ARTHROPLASTY: SHX124

## 2015-01-19 LAB — TYPE AND SCREEN
ABO/RH(D): A POS
Antibody Screen: NEGATIVE

## 2015-01-19 SURGERY — ARTHROPLASTY, HIP, TOTAL, ANTERIOR APPROACH
Anesthesia: General | Site: Hip | Laterality: Right

## 2015-01-19 MED ORDER — LIP MEDEX EX OINT
TOPICAL_OINTMENT | CUTANEOUS | Status: AC
Start: 2015-01-19 — End: 2015-01-19
  Administered 2015-01-19: 1
  Filled 2015-01-19: qty 7

## 2015-01-19 MED ORDER — PHENOL 1.4 % MT LIQD: 1.0000 | OROMUCOSAL | Status: DC | PRN

## 2015-01-19 MED ORDER — METHOCARBAMOL 500 MG PO TABS
500.0000 mg | ORAL_TABLET | Freq: Four times a day (QID) | ORAL | Status: DC | PRN
  Administered 2015-01-20 (×2): 500 mg via ORAL
  Filled 2015-01-19 (×2): qty 1

## 2015-01-19 MED ORDER — LIDOCAINE HCL (CARDIAC) 20 MG/ML IV SOLN
INTRAVENOUS | Status: AC
  Filled 2015-01-19: qty 5

## 2015-01-19 MED ORDER — MENTHOL 3 MG MT LOZG: 1.0000 | LOZENGE | OROMUCOSAL | Status: DC | PRN

## 2015-01-19 MED ORDER — BISACODYL 10 MG RE SUPP: 10.0000 mg | Freq: Every day | RECTAL | Status: DC | PRN

## 2015-01-19 MED ORDER — CHLORHEXIDINE GLUCONATE 4 % EX LIQD: 60.0000 mL | Freq: Once | CUTANEOUS | Status: DC

## 2015-01-19 MED ORDER — DEXAMETHASONE SODIUM PHOSPHATE 10 MG/ML IJ SOLN
10.0000 mg | Freq: Once | INTRAMUSCULAR | Status: AC
  Administered 2015-01-19: 10 mg via INTRAVENOUS

## 2015-01-19 MED ORDER — ONDANSETRON HCL 4 MG PO TABS: 4.0000 mg | ORAL_TABLET | Freq: Four times a day (QID) | ORAL | Status: DC | PRN

## 2015-01-19 MED ORDER — METHOCARBAMOL 1000 MG/10ML IJ SOLN
500.0000 mg | Freq: Four times a day (QID) | INTRAMUSCULAR | Status: DC | PRN
  Administered 2015-01-19 (×2): 500 mg via INTRAVENOUS
  Filled 2015-01-19 (×5): qty 5

## 2015-01-19 MED ORDER — PROMETHAZINE HCL 6.25 MG/5ML PO SYRP
6.2500 mg | ORAL_SOLUTION | Freq: Every evening | ORAL | Status: DC | PRN
  Filled 2015-01-19: qty 5

## 2015-01-19 MED ORDER — ONDANSETRON HCL 4 MG/2ML IJ SOLN: 4.0000 mg | Freq: Once | INTRAMUSCULAR | Status: DC | PRN

## 2015-01-19 MED ORDER — DOCUSATE SODIUM 100 MG PO CAPS
100.0000 mg | ORAL_CAPSULE | Freq: Two times a day (BID) | ORAL | Status: DC
  Administered 2015-01-19 – 2015-01-20 (×2): 100 mg via ORAL

## 2015-01-19 MED ORDER — MEPERIDINE HCL 50 MG/ML IJ SOLN: 6.2500 mg | INTRAMUSCULAR | Status: DC | PRN

## 2015-01-19 MED ORDER — ASPIRIN EC 325 MG PO TBEC
325.0000 mg | DELAYED_RELEASE_TABLET | Freq: Two times a day (BID) | ORAL | Status: DC
  Administered 2015-01-20: 325 mg via ORAL
  Filled 2015-01-19 (×3): qty 1

## 2015-01-19 MED ORDER — DIPHENHYDRAMINE HCL 25 MG PO CAPS: 25.0000 mg | ORAL_CAPSULE | Freq: Four times a day (QID) | ORAL | Status: DC | PRN

## 2015-01-19 MED ORDER — CEFAZOLIN SODIUM-DEXTROSE 2-3 GM-% IV SOLR
2.0000 g | Freq: Four times a day (QID) | INTRAVENOUS | Status: AC
  Administered 2015-01-19 (×2): 2 g via INTRAVENOUS
  Filled 2015-01-19 (×2): qty 50

## 2015-01-19 MED ORDER — CELECOXIB 200 MG PO CAPS
200.0000 mg | ORAL_CAPSULE | Freq: Two times a day (BID) | ORAL | Status: DC
  Administered 2015-01-19 – 2015-01-20 (×3): 200 mg via ORAL
  Filled 2015-01-19 (×4): qty 1

## 2015-01-19 MED ORDER — HYDROMORPHONE HCL 1 MG/ML IJ SOLN
INTRAMUSCULAR | Status: AC
Start: 2015-01-19 — End: 2015-01-19
  Filled 2015-01-19: qty 1

## 2015-01-19 MED ORDER — HYDROMORPHONE HCL 1 MG/ML IJ SOLN
0.5000 mg | INTRAMUSCULAR | Status: DC | PRN
  Administered 2015-01-19 (×2): 1 mg via INTRAVENOUS
  Filled 2015-01-19 (×2): qty 1

## 2015-01-19 MED ORDER — METOCLOPRAMIDE HCL 5 MG/ML IJ SOLN
5.0000 mg | Freq: Three times a day (TID) | INTRAMUSCULAR | Status: DC | PRN
  Administered 2015-01-19: 10 mg via INTRAVENOUS
  Filled 2015-01-19: qty 2

## 2015-01-19 MED ORDER — MIDAZOLAM HCL 5 MG/5ML IJ SOLN
INTRAMUSCULAR | Status: DC | PRN
  Administered 2015-01-19: 2 mg via INTRAVENOUS

## 2015-01-19 MED ORDER — ONDANSETRON HCL 4 MG/2ML IJ SOLN
INTRAMUSCULAR | Status: DC | PRN
  Administered 2015-01-19: 4 mg via INTRAVENOUS

## 2015-01-19 MED ORDER — ALUM & MAG HYDROXIDE-SIMETH 200-200-20 MG/5ML PO SUSP: 30.0000 mL | ORAL | Status: DC | PRN

## 2015-01-19 MED ORDER — TRANEXAMIC ACID 1000 MG/10ML IV SOLN
1000.0000 mg | Freq: Once | INTRAVENOUS | Status: AC
  Administered 2015-01-19: 1000 mg via INTRAVENOUS
  Filled 2015-01-19: qty 10

## 2015-01-19 MED ORDER — ONDANSETRON HCL 4 MG/2ML IJ SOLN
4.0000 mg | Freq: Four times a day (QID) | INTRAMUSCULAR | Status: DC | PRN
  Administered 2015-01-19 (×2): 4 mg via INTRAVENOUS
  Filled 2015-01-19 (×2): qty 2

## 2015-01-19 MED ORDER — MIDAZOLAM HCL 2 MG/2ML IJ SOLN
INTRAMUSCULAR | Status: AC
  Filled 2015-01-19: qty 2

## 2015-01-19 MED ORDER — FENTANYL CITRATE (PF) 250 MCG/5ML IJ SOLN
INTRAMUSCULAR | Status: DC | PRN
  Administered 2015-01-19 (×8): 25 ug via INTRAVENOUS

## 2015-01-19 MED ORDER — HYDROMORPHONE HCL 1 MG/ML IJ SOLN
INTRAMUSCULAR | Status: AC
  Filled 2015-01-19: qty 1

## 2015-01-19 MED ORDER — LORATADINE 10 MG PO TABS
10.0000 mg | ORAL_TABLET | Freq: Every day | ORAL | Status: DC
  Administered 2015-01-19 – 2015-01-20 (×2): 10 mg via ORAL
  Filled 2015-01-19 (×2): qty 1

## 2015-01-19 MED ORDER — FERROUS SULFATE 325 (65 FE) MG PO TABS
325.0000 mg | ORAL_TABLET | Freq: Three times a day (TID) | ORAL | Status: DC
  Administered 2015-01-19 – 2015-01-20 (×3): 325 mg via ORAL
  Filled 2015-01-19 (×6): qty 1

## 2015-01-19 MED ORDER — PROPOFOL 10 MG/ML IV BOLUS
INTRAVENOUS | Status: AC
  Filled 2015-01-19: qty 40

## 2015-01-19 MED ORDER — POLYETHYLENE GLYCOL 3350 17 G PO PACK
17.0000 g | PACK | Freq: Two times a day (BID) | ORAL | Status: DC
  Administered 2015-01-19 – 2015-01-20 (×2): 17 g via ORAL

## 2015-01-19 MED ORDER — SODIUM CHLORIDE 0.9 % IV SOLN
100.0000 mL/h | INTRAVENOUS | Status: DC
  Administered 2015-01-19 (×2): 100 mL/h via INTRAVENOUS
  Filled 2015-01-19 (×6): qty 1000

## 2015-01-19 MED ORDER — DEXTROMETHORPHAN POLISTIREX ER 30 MG/5ML PO SUER
15.0000 mg | Freq: Every evening | ORAL | Status: DC | PRN
  Filled 2015-01-19: qty 5

## 2015-01-19 MED ORDER — HYDROCODONE-ACETAMINOPHEN 7.5-325 MG PO TABS
1.0000 | ORAL_TABLET | ORAL | Status: DC
  Administered 2015-01-19 – 2015-01-20 (×6): 2 via ORAL
  Filled 2015-01-19 (×6): qty 2

## 2015-01-19 MED ORDER — ROCURONIUM BROMIDE 100 MG/10ML IV SOLN
INTRAVENOUS | Status: AC
  Filled 2015-01-19: qty 1

## 2015-01-19 MED ORDER — MAGNESIUM CITRATE PO SOLN: 1.0000 | Freq: Once | ORAL | Status: DC | PRN

## 2015-01-19 MED ORDER — LIP MEDEX EX OINT
TOPICAL_OINTMENT | CUTANEOUS | Status: AC
  Filled 2015-01-19: qty 7

## 2015-01-19 MED ORDER — METOCLOPRAMIDE HCL 10 MG PO TABS: 5.0000 mg | ORAL_TABLET | Freq: Three times a day (TID) | ORAL | Status: DC | PRN

## 2015-01-19 MED ORDER — CEFAZOLIN SODIUM-DEXTROSE 2-3 GM-% IV SOLR
2.0000 g | INTRAVENOUS | Status: AC
  Administered 2015-01-19: 2 g via INTRAVENOUS

## 2015-01-19 MED ORDER — PROPOFOL 10 MG/ML IV BOLUS
INTRAVENOUS | Status: AC
  Filled 2015-01-19: qty 20

## 2015-01-19 MED ORDER — ONDANSETRON HCL 4 MG/2ML IJ SOLN
INTRAMUSCULAR | Status: AC
  Filled 2015-01-19: qty 2

## 2015-01-19 MED ORDER — SODIUM CHLORIDE 0.9 % IR SOLN
Status: DC | PRN
  Administered 2015-01-19: 1000 mL

## 2015-01-19 MED ORDER — PROPOFOL 10 MG/ML IV BOLUS
INTRAVENOUS | Status: DC | PRN
  Administered 2015-01-19: 150 mg via INTRAVENOUS
  Administered 2015-01-19: 50 mg via INTRAVENOUS

## 2015-01-19 MED ORDER — FENTANYL CITRATE (PF) 100 MCG/2ML IJ SOLN
INTRAMUSCULAR | Status: AC
  Filled 2015-01-19: qty 2

## 2015-01-19 MED ORDER — LACTATED RINGERS IV SOLN
INTRAVENOUS | Status: DC | PRN
  Administered 2015-01-19 (×2): via INTRAVENOUS

## 2015-01-19 MED ORDER — DEXAMETHASONE SODIUM PHOSPHATE 10 MG/ML IJ SOLN
10.0000 mg | Freq: Once | INTRAMUSCULAR | Status: AC
  Administered 2015-01-20: 10 mg via INTRAVENOUS
  Filled 2015-01-19: qty 1

## 2015-01-19 MED ORDER — HYDROMORPHONE HCL 1 MG/ML IJ SOLN
0.2500 mg | INTRAMUSCULAR | Status: DC | PRN
  Administered 2015-01-19 (×4): 0.5 mg via INTRAVENOUS

## 2015-01-19 MED ORDER — PROMETHAZINE-DM 6.25-15 MG/5ML PO SYRP: 5.0000 mL | ORAL_SOLUTION | Freq: Every evening | ORAL | Status: DC | PRN

## 2015-01-19 MED ORDER — FENTANYL CITRATE (PF) 250 MCG/5ML IJ SOLN
INTRAMUSCULAR | Status: AC
  Filled 2015-01-19: qty 5

## 2015-01-19 SURGICAL SUPPLY — 34 items
BAG DECANTER FOR FLEXI CONT (MISCELLANEOUS) IMPLANT
BAG ZIPLOCK 12X15 (MISCELLANEOUS) IMPLANT
CAPT HIP TOTAL 2 ×3 IMPLANT
CLOTH BEACON ORANGE TIMEOUT ST (SAFETY) ×3 IMPLANT
COVER PERINEAL POST (MISCELLANEOUS) ×3 IMPLANT
DRAPE STERI IOBAN 125X83 (DRAPES) ×3 IMPLANT
DRAPE U-SHAPE 47X51 STRL (DRAPES) ×6 IMPLANT
DRSG AQUACEL AG ADV 3.5X10 (GAUZE/BANDAGES/DRESSINGS) ×3 IMPLANT
DURAPREP 26ML APPLICATOR (WOUND CARE) ×3 IMPLANT
ELECT REM PT RETURN 15FT ADLT (MISCELLANEOUS) IMPLANT
ELECT REM PT RETURN 9FT ADLT (ELECTROSURGICAL) ×3
ELECTRODE REM PT RTRN 9FT ADLT (ELECTROSURGICAL) ×1 IMPLANT
GLOVE BIOGEL M 7.0 STRL (GLOVE) IMPLANT
GLOVE BIOGEL M STRL SZ7.5 (GLOVE) IMPLANT
GLOVE BIOGEL PI IND STRL 7.5 (GLOVE) ×1 IMPLANT
GLOVE BIOGEL PI IND STRL 8.5 (GLOVE) ×1 IMPLANT
GLOVE BIOGEL PI INDICATOR 7.5 (GLOVE) ×2
GLOVE BIOGEL PI INDICATOR 8.5 (GLOVE) ×2
GLOVE ECLIPSE 8.0 STRL XLNG CF (GLOVE) ×6 IMPLANT
GLOVE ORTHO TXT STRL SZ7.5 (GLOVE) ×3 IMPLANT
GOWN STRL REUS W/TWL LRG LVL3 (GOWN DISPOSABLE) ×3 IMPLANT
GOWN STRL REUS W/TWL XL LVL3 (GOWN DISPOSABLE) ×3 IMPLANT
HOLDER FOLEY CATH W/STRAP (MISCELLANEOUS) ×3 IMPLANT
LIQUID BAND (GAUZE/BANDAGES/DRESSINGS) ×3 IMPLANT
PACK ANTERIOR HIP CUSTOM (KITS) ×3 IMPLANT
SAW OSC TIP CART 19.5X105X1.3 (SAW) ×3 IMPLANT
SUT MNCRL AB 4-0 PS2 18 (SUTURE) ×3 IMPLANT
SUT VIC AB 1 CT1 36 (SUTURE) ×9 IMPLANT
SUT VIC AB 2-0 CT1 27 (SUTURE) ×4
SUT VIC AB 2-0 CT1 TAPERPNT 27 (SUTURE) ×2 IMPLANT
SUT VLOC 180 0 24IN GS25 (SUTURE) ×3 IMPLANT
TRAY FOLEY W/METER SILVER 14FR (SET/KITS/TRAYS/PACK) ×3 IMPLANT
TRAY FOLEY W/METER SILVER 16FR (SET/KITS/TRAYS/PACK) IMPLANT
WATER STERILE IRR 1500ML POUR (IV SOLUTION) ×3 IMPLANT

## 2015-01-19 NOTE — Discharge Instructions (Signed)

## 2015-01-19 NOTE — Addendum Note (Signed)
Addendum  created 01/19/15 1215 by Lillia Abed, MD   Modules edited: Clinical Notes, Notes Section   Clinical Notes:  File: [id number]   Notes Section:  Delete: [id number]

## 2015-01-19 NOTE — Transfer of Care (Signed)
Immediate Anesthesia Transfer of Care Note  Patient: Lauren [name]  Procedure(s) Performed: Procedure(s): TOTAL RIGHT HIP ARTHROPLASTY ANTERIOR APPROACH (Right)  Patient Location: PACU  Anesthesia Type:General  Level of Consciousness: awake, alert  and patient cooperative  Airway & Oxygen Therapy: Patient Spontanous Breathing and Patient connected to face mask oxygen  Post-op Assessment: Report given to RN and Post -op Vital signs reviewed and stable  Post vital signs: Reviewed and stable  Last Vitals:  Filed Vitals:   01/19/15 0500  BP: 147/65  Pulse: 73  Temp: 36.7 C  Resp: 14    Complications: No apparent anesthesia complications

## 2015-01-19 NOTE — Anesthesia Procedure Notes (Signed)
Procedure Name: LMA Insertion Date/Time: 01/19/2015 7:19 AM Performed by: Dione Booze Pre-anesthesia Checklist: Patient identified, Emergency Drugs available, Suction available and Patient being monitored Patient Re-evaluated:Patient Re-evaluated prior to inductionOxygen Delivery Method: Circle system utilized Preoxygenation: Pre-oxygenation with 100% oxygen Intubation Type: IV induction LMA: LMA inserted LMA Size: 4.0 Number of attempts: 1 Placement Confirmation: breath sounds checked- equal and bilateral and positive ETCO2 Tube secured with: Tape Dental Injury: Teeth and Oropharynx as per pre-operative assessment

## 2015-01-19 NOTE — Interval H&P Note (Signed)
History and Physical Interval Note:  01/19/2015 6:52 AM  Lauren [name]  has presented today for surgery, with the diagnosis of RIGHT HIP OA  The various methods of treatment have been discussed with the patient and family. After consideration of risks, benefits and other options for treatment, the patient has consented to  Procedure(s): TOTAL RIGHT HIP ARTHROPLASTY ANTERIOR APPROACH (Right) as a surgical intervention .  The patient's history has been reviewed, patient examined, no change in status, stable for surgery.  I have reviewed the patient's chart and labs.  Questions were answered to the patient's satisfaction.     Mauri Pole

## 2015-01-19 NOTE — Anesthesia Preprocedure Evaluation (Addendum)
Anesthesia Evaluation  Patient identified by MRN, date of birth, ID band Patient awake    Reviewed: Allergy & Precautions, NPO status , Patient's Chart, lab work & pertinent test results  Airway Mallampati: I  TM Distance: >3 FB Neck ROM: Full    Dental   Pulmonary    Pulmonary exam normal        Cardiovascular Normal cardiovascular exam     Neuro/Psych    GI/Hepatic   Endo/Other    Renal/GU      Musculoskeletal   Abdominal   Peds  Hematology   Anesthesia Other Findings   Reproductive/Obstetrics                             Anesthesia Physical Anesthesia Plan  ASA: II  Anesthesia Plan: General   Post-op Pain Management:    Induction: Intravenous  Airway Management Planned: LMA  Additional Equipment:   Intra-op Plan:   Post-operative Plan: Extubation in OR  Informed Consent: I have reviewed the patients History and Physical, chart, labs and discussed the procedure including the risks, benefits and alternatives for the proposed anesthesia with the patient or authorized representative who has indicated his/her understanding and acceptance.     Plan Discussed with: CRNA and Surgeon  Anesthesia Plan Comments: (Pt does not want a spinal due to previous experience.)       Anesthesia Quick Evaluation

## 2015-01-19 NOTE — Anesthesia Postprocedure Evaluation (Deleted)
Anesthesia Post Note  Patient: Lauren [name]  Procedure(s) Performed: Procedure(s) (LRB): TOTAL RIGHT HIP ARTHROPLASTY ANTERIOR APPROACH (Right)  Patient location during evaluation: PACU Anesthesia Type: Spinal and MAC Level of consciousness: awake and alert Pain management: pain level controlled Vital Signs Assessment: post-procedure vital signs reviewed and stable Respiratory status: spontaneous breathing and respiratory function stable Cardiovascular status: blood pressure returned to baseline and stable Postop Assessment: Spinal receding Anesthetic complications: no    Last Vitals:  Filed Vitals:   01/19/15 1019 01/19/15 1124  BP: 130/67 106/65  Pulse: 68 74  Temp: 36.4 C 36.3 C  Resp: 12 14    Last Pain:  Filed Vitals:   01/19/15 1149  PainSc: 4                  Aubree Doody DAVID

## 2015-01-19 NOTE — Progress Notes (Signed)
Utilization review completed.  

## 2015-01-19 NOTE — Anesthesia Postprocedure Evaluation (Signed)
Anesthesia Post Note  Patient: Lauren [name]  Procedure(s) Performed: Procedure(s) (LRB): TOTAL RIGHT HIP ARTHROPLASTY ANTERIOR APPROACH (Right)  Patient location during evaluation: PACU Anesthesia Type: General Level of consciousness: awake and alert Pain management: pain level controlled Vital Signs Assessment: post-procedure vital signs reviewed and stable Respiratory status: spontaneous breathing, nonlabored ventilation, respiratory function stable and patient connected to nasal cannula oxygen Cardiovascular status: blood pressure returned to baseline and stable Postop Assessment: No signs of nausea or vomiting Anesthetic complications: no    Last Vitals:  Filed Vitals:   01/19/15 1019 01/19/15 1124  BP: 130/67 106/65  Pulse: 68 74  Temp: 36.4 C 36.3 C  Resp: 12 14    Last Pain:  Filed Vitals:   01/19/15 1149  PainSc: 4                  Arohi Salvatierra DAVID

## 2015-01-19 NOTE — Op Note (Signed)
NAME:  Lauren [name]                ACCOUNT NO.: [id number]      MEDICAL RECORD NO.: [id number]      FACILITY:  [facility]      PHYSICIAN:  Paralee Cancel D  DATE OF BIRTH:  October 15, 1950     DATE OF PROCEDURE:  01/19/2015                                 OPERATIVE REPORT         PREOPERATIVE DIAGNOSIS: Right  hip osteoarthritis.      POSTOPERATIVE DIAGNOSIS:  Right hip osteoarthritis.      PROCEDURE:  Right total hip replacement through an anterior approach   utilizing DePuy THR system, component size 42mm pinnacle cup, a size 36+4 neutral   Altrex liner, a size 3 hi Tri Lock stem with a 36+5 delta ceramic   ball.      SURGEON:  Pietro Cassis. Alvan Dame, M.D.      ASSISTANT:  Danae Orleans, PA-c      ANESTHESIA:  General.      SPECIMENS:  None.      COMPLICATIONS:  None.      BLOOD LOSS:  400 cc     DRAINS:  None.      INDICATION OF THE PROCEDURE:  Lauren [name] is a 64 y.o. female who had   presented to office for evaluation of right hip pain.  Radiographs revealed   progressive degenerative changes with bone-on-bone   articulation to the  hip joint.  The patient had painful limited range of   motion significantly affecting their overall quality of life.  The patient was failing to    respond to conservative measures, and at this point was ready   to proceed with more definitive measures.  The patient has noted progressive   degenerative changes in his hip, progressive problems and dysfunction   with regarding the hip prior to surgery.  Consent was obtained for   benefit of pain relief.  Specific risk of infection, DVT, component   failure, dislocation, need for revision surgery, as well discussion of   the anterior versus posterior approach were reviewed.  Consent was   obtained for benefit of anterior pain relief through an anterior   approach.      PROCEDURE IN DETAIL:  The patient was brought to operative theater.   Once adequate anesthesia, preoperative  antibiotics, 2 gm of Ancef, 1 gm of Tranexamic Acid, and 10 mg of Decadron administered.   The patient was positioned supine on the OSI Hanna table.  Once adequate   padding of boney process was carried out, we had predraped out the hip, and  used fluoroscopy to confirm orientation of the pelvis and position.      The right hip was then prepped and draped from proximal iliac crest to   mid thigh with shower curtain technique.      Time-out was performed identifying the patient, planned procedure, and   extremity.     An incision was then made 2 cm distal and lateral to the   anterior superior iliac spine extending over the orientation of the   tensor fascia lata muscle and sharp dissection was carried down to the   fascia of the muscle and protractor placed in the soft tissues.      The fascia was  then incised.  The muscle belly was identified and swept   laterally and retractor placed along the superior neck.  Following   cauterization of the circumflex vessels and removing some pericapsular   fat, a second cobra retractor was placed on the inferior neck.  A third   retractor was placed on the anterior acetabulum after elevating the   anterior rectus.  A L-capsulotomy was along the line of the   superior neck to the trochanteric fossa, then extended proximally and   distally.  Tag sutures were placed and the retractors were then placed   intracapsular.  We then identified the trochanteric fossa and   orientation of my neck cut, confirmed this radiographically   and then made a neck osteotomy with the femur on traction.  The femoral   head was removed without difficulty or complication.  Traction was let   off and retractors were placed posterior and anterior around the   acetabulum.      The labrum and foveal tissue were debrided.  I began reaming with a 57mm   reamer and reamed up to 82mm reamer in order to get to subchondral bone plus drilled some scelrotic bone to enhance bony  ingrowth and the 11mm   cup was chosen.  The final 91mm Pinnacle cup was then impacted under fluoroscopy  to confirm the depth of penetration and orientation with respect to   abduction.  A screw was placed followed by the hole eliminator.  The final   36+4 neutral Altrex liner was impacted with good visualized rim fit.  The cup was positioned anatomically within the acetabular portion of the pelvis.      At this point, the femur was rolled at 80 degrees.  Further capsule was   released off the inferior aspect of the femoral neck.  I then   released the superior capsule proximally.  The hook was placed laterally   along the femur and elevated manually and held in position with the bed   hook.  The leg was then extended and adducted with the leg rolled to 100   degrees of external rotation.  Once the proximal femur was fully   exposed, I used a box osteotome to set orientation.  I then began   broaching with the starting chili pepper broach and passed this by hand and then broached up to 3.  With the 3 broach in place I chose a high offset neck and did a trial reduction.  The offset was appropriate, leg lengths   appeared to be equal best with the +5 head ball, confirmed radiographically.   Given these findings, I went ahead and dislocated the hip, repositioned all   retractors and positioned the right hip in the extended and abducted position.  The final 3 Hi Tri Lock stem was   chosen and it was impacted down to the level of neck cut.  Based on this   and the trial reduction, a 36+5 delta ceramic ball was chosen and   impacted onto a clean and dry trunnion, and the hip was reduced.  The   hip had been irrigated throughout the case again at this point.  I did   reapproximate the superior capsular leaflet to the anterior leaflet   using #1 Vicryl.  The fascia of the   tensor fascia lata muscle was then reapproximated using #1 Vicryl and # 0 V-lock sutures.  The   remaining wound was closed  with 2-0 Vicryl and  running 4-0 Monocryl.   The hip was cleaned, dried, and dressed sterilely using Dermabond and   Aquacel dressing.  She was then brought   to recovery room in stable condition tolerating the procedure well.    Danae Orleans, PA-C was present for the entirety of the case involved from   preoperative positioning, perioperative retractor management, general   facilitation of the case, as well as primary wound closure as assistant.            Pietro Cassis Alvan Dame, M.D.        01/19/2015 8:43 AM

## 2015-01-20 LAB — CBC
HCT: 31.6 % — ABNORMAL LOW (ref 36.0–46.0)
HEMOGLOBIN: 10.4 g/dL — AB (ref 12.0–15.0)
MCH: 29.8 pg (ref 26.0–34.0)
MCHC: 32.9 g/dL (ref 30.0–36.0)
MCV: 90.5 fL (ref 78.0–100.0)
Platelets: 261 10*3/uL (ref 150–400)
RBC: 3.49 MIL/uL — AB (ref 3.87–5.11)
RDW: 13.5 % (ref 11.5–15.5)
WBC: 7.7 10*3/uL (ref 4.0–10.5)

## 2015-01-20 LAB — BASIC METABOLIC PANEL
Anion gap: 6 (ref 5–15)
BUN: 8 mg/dL (ref 6–20)
CHLORIDE: 107 mmol/L (ref 101–111)
CO2: 26 mmol/L (ref 22–32)
CREATININE: 0.66 mg/dL (ref 0.44–1.00)
Calcium: 8 mg/dL — ABNORMAL LOW (ref 8.9–10.3)
GFR calc Af Amer: >60 mL/min (ref >60)
GFR calc non Af Amer: >60 mL/min (ref >60)
GLUCOSE: 117 mg/dL — AB (ref 65–99)
POTASSIUM: 4.1 mmol/L (ref 3.5–5.1)
Sodium: 139 mmol/L (ref 135–145)

## 2015-01-20 MED ORDER — HYDROCODONE-ACETAMINOPHEN 7.5-325 MG PO TABS: 1.0000 | ORAL_TABLET | ORAL | Status: DC | PRN

## 2015-01-20 MED ORDER — METHOCARBAMOL 500 MG PO TABS: 500.0000 mg | ORAL_TABLET | Freq: Four times a day (QID) | ORAL | Status: DC | PRN

## 2015-01-20 MED ORDER — FERROUS SULFATE 325 (65 FE) MG PO TABS: 325.0000 mg | ORAL_TABLET | Freq: Three times a day (TID) | ORAL | Status: DC

## 2015-01-20 MED ORDER — ASPIRIN 325 MG PO TBEC: 325.0000 mg | DELAYED_RELEASE_TABLET | Freq: Two times a day (BID) | ORAL | Status: AC

## 2015-01-20 MED ORDER — POLYETHYLENE GLYCOL 3350 17 G PO PACK: 17.0000 g | PACK | Freq: Two times a day (BID) | ORAL | Status: DC

## 2015-01-20 MED ORDER — DOCUSATE SODIUM 100 MG PO CAPS: 100.0000 mg | ORAL_CAPSULE | Freq: Two times a day (BID) | ORAL | Status: DC

## 2015-01-20 NOTE — Evaluation (Signed)
Occupational Therapy Evaluation Patient Details Name: Lauren [name] MRN: [id number] DOB: [birth year] Today's Date: 01/20/2015    History of Present Illness R DAtha   Clinical Impression   This 64 year old female was admitted for the above surgery. All education was completed.  No further OT is needed at this time    Follow Up Recommendations  No OT follow up    Equipment Recommendations  3 in 1 bedside comode    Recommendations for Other Services       Precautions / Restrictions Precautions Precautions: Fall Restrictions Weight Bearing Restrictions: No      Mobility Bed Mobility               General bed mobility comments: in bathroom  Transfers Overall transfer level: Needs assistance Equipment used: Rolling walker (2 wheeled) Transfers: Sit to/from Stand Sit to Stand: Supervision         General transfer comment: cues for hand and leg position    Balance                                            ADL Overall ADL's : Needs assistance/impaired             Lower Body Bathing: Minimal assistance;Sit to/from stand       Lower Body Dressing: Moderate assistance;Sit to/from stand   Toilet Transfer: Supervision/safety;Ambulation;BSC (cues for safety with RW)       Tub/ Shower Transfer: Min guard;Ambulation;3 in 1     General ADL Comments: performed ADL at sink in bathroom, sitting on commode for LB dressing.  Supervision/set up for UB adls from standing.  Educated on working within pain tolerance. Pt will have 24/7 with family.       Vision     Perception     Praxis      Pertinent Vitals/Pain Pain Assessment: 0-10 Pain Score: 2  Pain Location: inner thigh Pain Descriptors / Indicators: Aching Pain Intervention(s): Monitored during session;Ice applied;Premedicated before session;Repositioned;Limited activity within patient's tolerance (heat to thigh)     Hand Dominance     Extremity/Trunk Assessment Upper  Extremity Assessment Upper Extremity Assessment: Overall WFL for tasks assessed      Cervical / Trunk Assessment Cervical / Trunk Assessment: Normal   Communication Communication Communication: No difficulties   Cognition Arousal/Alertness: Awake/alert Behavior During Therapy: WFL for tasks assessed/performed Overall Cognitive Status: Within Functional Limits for tasks assessed                     General Comments       Exercises       Shoulder Instructions      Home Living Family/patient expects to be discharged to:: Private residence Living Arrangements: Spouse/significant other;Non-relatives/Friends Available Help at Discharge: Family;Friend(s);Available 24 hours/day Type of Home: House Home Access: Stairs to enter [employer] of Steps: 1 Entrance Stairs-Rails: None Home Layout: One level     Bathroom Shower/Tub: Occupational psychologist: Standard     Home Equipment: Environmental consultant - 2 wheels   Additional Comments: 3:1 ordered      Prior Functioning/Environment Level of Independence: Independent             OT Diagnosis: Acute pain   OT Problem List:     OT Treatment/Interventions:      OT Goals(Current goals can be found in the care  plan section) Acute Rehab OT Goals Patient Stated Goal: to go home  OT Frequency:     Barriers to D/C:            Co-evaluation              End of Session    Activity Tolerance: Patient tolerated treatment well Patient left: in chair;with call bell/phone within reach;with chair alarm set   Time: [id number] OT Time Calculation (min): 29 min Charges:  OT General Charges $OT Visit: 1 Procedure OT Evaluation $Initial OT Evaluation Tier I: 1 Procedure OT Treatments $Self Care/Home Management : 8-22 mins G-Codes:    Anjela Cassara 2015/01/24, 11:03 AM  Lesle Chris, OTR/L [phone] 01/24/15

## 2015-01-20 NOTE — Care Management Note (Signed)
Case Management Note  Patient Details  Name: Veryl Mcmunn MRN: [id number] Date of Birth: 1950/04/14  Subjective/Objective:      S/p Right total hip replacement through an anterior approach               Action/Plan: Discharge planning, spoke with patient at bedside. States she wants [place] for [facility] services. Needs 3-n-1, has RW. Contacted [facility] for [facility] referral, contacted [facility] for DME needs.   Expected Discharge Date:  01/20/15               Expected Discharge Plan:  [place]  In-House Referral:  NA  Discharge planning Services  CM Consult  Post Acute Care Choice:  Home Health, Durable Medical Equipment Choice offered to:  Patient  DME Arranged:  3-N-1 DME Agency:  [place]:  PT [place]:  [place]  Status of Service:  Completed, signed off  Medicare Important Message Given:    Date Medicare IM Given:    Medicare IM give by:    Date Additional Medicare IM Given:    Additional Medicare Important Message give by:     If discussed at [place] of Stay Meetings, dates discussed:    Additional Comments:  Guadalupe Maple, RN 01/20/2015, 10:11 AM

## 2015-01-20 NOTE — Progress Notes (Signed)
     Subjective: 1 Day Post-Op Procedure(s) (LRB): TOTAL RIGHT HIP ARTHROPLASTY ANTERIOR APPROACH (Right)   Seen by Dr. Alvan Dame. Patient reports pain as mild, pain controlled. No events throughout the night. Ready to be discharged home, if she does well with PT.  Objective:   VITALS:   Filed Vitals:   01/20/15 0145 01/20/15 0529  BP: 109/60 117/67  Pulse: 73 69  Temp: 98.6 F (37 C) 98.4 F (36.9 C)  Resp: 16 16    Dorsiflexion/Plantar flexion intact Incision: dressing C/D/I No cellulitis present Compartment soft  LABS  Recent Labs  01/20/15 0445  HGB 10.4*  HCT 31.6*  WBC 7.7  PLT 261     Recent Labs  01/20/15 0445  NA 139  K 4.1  BUN 8  CREATININE 0.66  GLUCOSE 117*     Assessment/Plan: 1 Day Post-Op Procedure(s) (LRB): TOTAL RIGHT HIP ARTHROPLASTY ANTERIOR APPROACH (Right) Foley cath d/c'ed Advance diet Up with therapy D/C IV fluids Discharge home with home health  Follow [facility] in 2 weeks at [facility]. Follow up with OLIN,Taedyn Glasscock D in 2 weeks.  Contact information:  [facility] [address], [place] [id number]    Obese (BMI 30-39.9) Estimated body mass index is 35.07 kg/(m^2) as calculated from the following:   Height as of this encounter: 5' (1.524 m).   Weight as of this encounter: 81.449 kg (179 lb 9 oz). Patient also counseled that weight may inhibit the healing process Patient counseled that losing weight will help with future health issues       [place]. Rhiley Solem   PAC  01/20/2015, 10:00 AM

## 2015-01-20 NOTE — Evaluation (Signed)
Physical Therapy Evaluation Patient Details Name: Leiliana Foody MRN: [id number] DOB: 07-19-1950 Today's Date: 01/20/2015   History of Present Illness  R DAtha  Clinical Impression  Patient is progressing well. Has met goals.   Ready for DC.    Follow Up Recommendations Home health PT;Supervision/Assistance - 24 hour    Equipment Recommendations  None recommended by PT    Recommendations for Other Services       Precautions / Restrictions Precautions Precautions: Fall Restrictions Weight Bearing Restrictions: No      Mobility  Bed Mobility               General bed mobility comments: in bathroom  Transfers Overall transfer level: Needs assistance Equipment used: Rolling walker (2 wheeled) Transfers: Sit to/from Stand Sit to Stand: Supervision         General transfer comment: cues for hand and leg position  Ambulation/Gait Ambulation/Gait assistance: Supervision Ambulation Distance (Feet): 200 Feet Assistive device: Rolling walker (2 wheeled) Gait Pattern/deviations: Step-through pattern     General Gait Details: pt reports that R leg feels longer.  Stairs Stairs: Yes Stairs assistance: Min guard Stair Management: Step to pattern;Forwards;With walker Number of Stairs: 1 General stair comments: family opresent for instruction  Wheelchair Mobility    Modified Rankin (Stroke Patients Only)       Balance                                             Pertinent Vitals/Pain Pain Assessment: 0-10 Pain Score: 2  Pain Location: INNER THIGH Pain Descriptors / Indicators: Aching;Tightness Pain Intervention(s): Monitored during session;Limited activity within patient's tolerance;Premedicated before session;Relaxation;Heat applied;Repositioned    Home Living Family/patient expects to be discharged to:: Private residence Living Arrangements: Spouse/significant other;Non-relatives/Friends Available Help at Discharge:  Family;Friend(s);Available 24 hours/day Type of Home: House Home Access: Stairs to enter Entrance Stairs-Rails: None Entrance Stairs-Number of Steps: 1 Home Layout: One level Home Equipment: Environmental consultant - 2 wheels      Prior Function Level of Independence: Independent               Hand Dominance        Extremity/Trunk Assessment   Upper Extremity Assessment: Defer to OT evaluation           Lower Extremity Assessment: RLE deficits/detail RLE Deficits / Details: advances the leg    Cervical / Trunk Assessment: Normal  Communication   Communication: No difficulties  Cognition Arousal/Alertness: Awake/alert Behavior During Therapy: WFL for tasks assessed/performed Overall Cognitive Status: Within Functional Limits for tasks assessed                      General Comments      Exercises Total Joint Exercises Ankle Circles/Pumps: AROM;Both;10 reps Quad Sets: AROM;Both;10 reps;Supine Short Arc Quad: AROM;Right;10 reps;Supine Heel Slides: AROM;Right;10 reps;Supine Hip ABduction/ADduction: AROM;Right;10 reps;Supine Long Arc Quad: AROM;Right;10 reps;Supine      Assessment/Plan    PT Assessment All further PT needs can be met in the next venue of care  PT Diagnosis     PT Problem List Decreased strength;Decreased range of motion;Decreased activity tolerance;Decreased mobility;Pain  PT Treatment Interventions     PT Goals (Current goals can be found in the Care Plan section) Acute Rehab PT Goals Patient Stated Goal: to go home PT Goal Formulation: All assessment and education complete, DC therapy  Frequency     Barriers to discharge        Co-evaluation               End of Session   Activity Tolerance: Patient tolerated treatment well Patient left: in chair;with call bell/phone within reach;with chair alarm set;with family/visitor present Nurse Communication: Mobility status         Time: 8337-4451 PT Time Calculation (min)  (ACUTE ONLY): 40 min   Charges:   PT Evaluation $Initial PT Evaluation Tier I: 1 Procedure PT Treatments $Gait Training: 8-22 mins $Therapeutic Exercise: 8-22 mins   PT G Codes:        Claretha Cooper 01/20/2015, 11:01 AM Tresa Endo PT [phone]

## 2015-01-24 DIAGNOSIS — Z6835 Body mass index (BMI) 35.0-35.9, adult: Secondary | ICD-10-CM | POA: Diagnosis not present

## 2015-01-24 DIAGNOSIS — I10 Essential (primary) hypertension: Secondary | ICD-10-CM | POA: Diagnosis not present

## 2015-01-24 DIAGNOSIS — Z79891 Long term (current) use of opiate analgesic: Secondary | ICD-10-CM | POA: Diagnosis not present

## 2015-01-24 DIAGNOSIS — Z7982 Long term (current) use of aspirin: Secondary | ICD-10-CM | POA: Diagnosis not present

## 2015-01-24 DIAGNOSIS — E669 Obesity, unspecified: Secondary | ICD-10-CM | POA: Diagnosis not present

## 2015-01-24 DIAGNOSIS — Z9181 History of falling: Secondary | ICD-10-CM | POA: Diagnosis not present

## 2015-01-24 DIAGNOSIS — E7800 Pure hypercholesterolemia, unspecified: Secondary | ICD-10-CM | POA: Diagnosis not present

## 2015-01-24 DIAGNOSIS — Z96641 Presence of right artificial hip joint: Secondary | ICD-10-CM | POA: Diagnosis not present

## 2015-01-24 DIAGNOSIS — Z471 Aftercare following joint replacement surgery: Secondary | ICD-10-CM | POA: Diagnosis not present

## 2015-01-25 NOTE — Discharge Summary (Signed)
Physician Discharge Summary  Patient ID: Lauren [name] MRN: [id number] DOB/AGE: 1951-01-08 64 y.o.  Admit date: 01/19/2015 Discharge date: 01/20/2015   Procedures:  Procedure(s) (LRB): TOTAL RIGHT HIP ARTHROPLASTY ANTERIOR APPROACH (Right)  Attending Physician:  Dr. Paralee Cancel   Admission Diagnoses:   Right hip primary OA / pain  Discharge Diagnoses:  Active Problems:   S/P right THA, AA  Past Medical History  Diagnosis Date  . Hyperlipidemia   . Herpes simplex labialis   . Arthritis     hips-Right worse and right knee.  . Facial basal cell cancer     nose (Dr. Ubaldo Glassing), leg  . Squamous cell skin cancer     many sites (hands/legs)  . Melanoma ([place])     right shoulder- 10 yrs ago- no further issues    HPI:    Lauren [name], 64 y.o. female, has a history of pain and functional disability in the right hip(s) due to arthritis and patient has failed non-surgical conservative treatments for greater than 12 weeks to include NSAID's and/or analgesics and activity modification. Onset of symptoms was gradual starting ~3 years ago with gradually worsening course since that time.The patient noted no past surgery on the right hip(s). Patient currently rates pain in the right hip at 10 out of 10 with activity. Patient has night pain, worsening of pain with activity and weight bearing, trendelenberg gait, pain that interfers with activities of daily living and pain with passive range of motion. Patient has evidence of periarticular osteophytes and joint space narrowing by imaging studies. This condition presents safety issues increasing the risk of falls. There is no current active infection. Risks, benefits and expectations were discussed with the patient. Risks including but not limited to the risk of anesthesia, blood clots, nerve damage, blood vessel damage, failure of the prosthesis, infection and up to and including death. Patient understand the risks, benefits and expectations and  wishes to proceed with surgery.   PCP: Vikki Ports, MD   Discharged Condition: good  Hospital Course:  Patient underwent the above stated procedure on 01/19/2015. Patient tolerated the procedure well and brought to the recovery room in good condition and subsequently to the floor.  POD #1 BP: 117/67 ; Pulse: 69 ; Temp: 98.4 F (36.9 C) ; Resp: 16 Patient reports pain as mild, pain controlled. No events throughout the night. Ready to be discharged home. Dorsiflexion/plantar flexion intact, incision: dressing C/D/I, no cellulitis present and compartment soft.   LABS  Basename    HGB     10.4  HCT     31.6    Discharge Exam: General appearance: alert, cooperative and no distress Extremities: Homans sign is negative, no sign of DVT, no edema, redness or tenderness in the calves or thighs and no ulcers, gangrene or trophic changes  Disposition: Home with follow up in 2 weeks   Follow-up Information    Follow up with Mauri Pole, MD. Schedule an appointment as soon as possible for a visit in 2 weeks.   Specialty:  Orthopedic Surgery   Contact information:   [address] [address] [phone]       Follow up with [facility].   Why:  physical therapy   Contact information:   [address] [place] [address] [phone]       Follow up with [facility].   Why:  3-n-1   Contact information:   [address] [employer]  [place] [address] [phone]       Discharge Instructions    Call MD / Call 911    Complete by:  As directed   If you experience chest pain or shortness of breath, CALL 911 and be transported to the hospital emergency room.  If you develope a fever above 101 F, pus (white drainage) or increased drainage or redness at the wound, or calf pain, call your surgeon's office.     Change dressing    Complete by:  As directed   Maintain surgical dressing until follow up in the clinic. If  the edges start to pull up, may reinforce with tape. If the dressing is no longer working, may remove and cover with gauze and tape, but must keep the area dry and clean.  Call with any questions or concerns.     Constipation Prevention    Complete by:  As directed   Drink plenty of fluids.  Prune juice may be helpful.  You may use a stool softener, such as Colace (over the counter) 100 mg twice a day.  Use MiraLax (over the counter) for constipation as needed.     Diet - low sodium heart healthy    Complete by:  As directed      Discharge instructions    Complete by:  As directed   Maintain surgical dressing until follow up in the clinic. If the edges start to pull up, may reinforce with tape. If the dressing is no longer working, may remove and cover with gauze and tape, but must keep the area dry and clean.  Follow up in 2 weeks at [facility]. Call with any questions or concerns.     Increase activity slowly as tolerated    Complete by:  As directed   Weight bearing as tolerated with assist device (walker, cane, etc) as directed, use it as long as suggested by your surgeon or therapist, typically at least 4-6 weeks.     TED hose    Complete by:  As directed   Use stockings (TED hose) for 2 weeks on both leg(s).  You may remove them at night for sleeping.             Medication List    STOP taking these medications        aspirin 81 MG tablet  Replaced by:  aspirin 325 MG EC tablet      TAKE these medications        aspirin 325 MG EC tablet  Take 1 tablet (325 mg total) by mouth 2 (two) times daily.     cetirizine 10 MG tablet  Commonly known as:  ZYRTEC  Take 10 mg by mouth daily as needed for allergies.     docusate sodium 100 MG capsule  Commonly known as:  COLACE  Take 1 capsule (100 mg total) by mouth 2 (two) times daily.     ferrous sulfate 325 (65 FE) MG tablet  Take 1 tablet (325 mg total) by mouth 3 (three) times daily after meals.      HYDROcodone-acetaminophen 7.5-325 MG tablet  Commonly known as:  NORCO  Take 1-2 tablets by mouth every 4 (four) hours as needed for moderate pain.     methocarbamol 500 MG tablet  Commonly known as:  ROBAXIN  Take 1 tablet (500 mg total) by mouth every 6 (six) hours as needed for muscle spasms.     polyethylene glycol packet  Commonly known as:  MIRALAX /  GLYCOLAX  Take 17 g by mouth 2 (two) times daily.     promethazine-dextromethorphan 6.25-15 MG/5ML syrup  Commonly known as:  PROMETHAZINE-DM  Take 5 mLs by mouth at bedtime as needed for cough.     rosuvastatin 10 MG tablet  Commonly known as:  CRESTOR  Take 1 tablet (10 mg total) by mouth daily.     triamcinolone cream 0.1 %  Commonly known as:  KENALOG  APPLY TO LOWER LEGS TWICE A DAY AS NEEDED FOR DRY SKIN     valACYclovir 500 MG tablet  Commonly known as:  VALTREX  TAKE 1 TABLET DAILY FOR PREVENTION.OR 4 TAB AT ONCE AND REPEAT IN 12 HRS(8 TOTAL PER COURSE-OUTBREAK         Signed: [place] Pugh. Renlee Floor   PA-C  01/25/2015, 9:24 AM

## 2015-01-26 DIAGNOSIS — E7800 Pure hypercholesterolemia, unspecified: Secondary | ICD-10-CM | POA: Diagnosis not present

## 2015-01-26 DIAGNOSIS — Z9181 History of falling: Secondary | ICD-10-CM | POA: Diagnosis not present

## 2015-01-26 DIAGNOSIS — Z6835 Body mass index (BMI) 35.0-35.9, adult: Secondary | ICD-10-CM | POA: Diagnosis not present

## 2015-01-26 DIAGNOSIS — I10 Essential (primary) hypertension: Secondary | ICD-10-CM | POA: Diagnosis not present

## 2015-01-26 DIAGNOSIS — E669 Obesity, unspecified: Secondary | ICD-10-CM | POA: Diagnosis not present

## 2015-01-26 DIAGNOSIS — Z96641 Presence of right artificial hip joint: Secondary | ICD-10-CM | POA: Diagnosis not present

## 2015-01-26 DIAGNOSIS — Z7982 Long term (current) use of aspirin: Secondary | ICD-10-CM | POA: Diagnosis not present

## 2015-01-26 DIAGNOSIS — Z79891 Long term (current) use of opiate analgesic: Secondary | ICD-10-CM | POA: Diagnosis not present

## 2015-01-26 DIAGNOSIS — Z471 Aftercare following joint replacement surgery: Secondary | ICD-10-CM | POA: Diagnosis not present

## 2015-01-28 DIAGNOSIS — Z6835 Body mass index (BMI) 35.0-35.9, adult: Secondary | ICD-10-CM | POA: Diagnosis not present

## 2015-01-28 DIAGNOSIS — E7800 Pure hypercholesterolemia, unspecified: Secondary | ICD-10-CM | POA: Diagnosis not present

## 2015-01-28 DIAGNOSIS — Z9181 History of falling: Secondary | ICD-10-CM | POA: Diagnosis not present

## 2015-01-28 DIAGNOSIS — I10 Essential (primary) hypertension: Secondary | ICD-10-CM | POA: Diagnosis not present

## 2015-01-28 DIAGNOSIS — Z7982 Long term (current) use of aspirin: Secondary | ICD-10-CM | POA: Diagnosis not present

## 2015-01-28 DIAGNOSIS — E669 Obesity, unspecified: Secondary | ICD-10-CM | POA: Diagnosis not present

## 2015-01-28 DIAGNOSIS — Z471 Aftercare following joint replacement surgery: Secondary | ICD-10-CM | POA: Diagnosis not present

## 2015-01-28 DIAGNOSIS — Z96641 Presence of right artificial hip joint: Secondary | ICD-10-CM | POA: Diagnosis not present

## 2015-01-28 DIAGNOSIS — Z79891 Long term (current) use of opiate analgesic: Secondary | ICD-10-CM | POA: Diagnosis not present

## 2015-02-04 DIAGNOSIS — Z96641 Presence of right artificial hip joint: Secondary | ICD-10-CM | POA: Diagnosis not present

## 2015-02-04 DIAGNOSIS — Z471 Aftercare following joint replacement surgery: Secondary | ICD-10-CM | POA: Diagnosis not present

## 2015-03-05 DIAGNOSIS — M25561 Pain in right knee: Secondary | ICD-10-CM | POA: Diagnosis not present

## 2015-03-05 DIAGNOSIS — Z471 Aftercare following joint replacement surgery: Secondary | ICD-10-CM | POA: Diagnosis not present

## 2015-03-05 DIAGNOSIS — Z96641 Presence of right artificial hip joint: Secondary | ICD-10-CM | POA: Diagnosis not present

## 2015-03-05 DIAGNOSIS — M1711 Unilateral primary osteoarthritis, right knee: Secondary | ICD-10-CM | POA: Diagnosis not present

## 2015-03-08 ENCOUNTER — Other Ambulatory Visit: Payer: Medicare Other

## 2015-03-09 ENCOUNTER — Other Ambulatory Visit (INDEPENDENT_AMBULATORY_CARE_PROVIDER_SITE_OTHER): Payer: Medicare Other | Admitting: *Deleted

## 2015-03-09 DIAGNOSIS — Z01818 Encounter for other preprocedural examination: Secondary | ICD-10-CM

## 2015-03-09 DIAGNOSIS — Z8249 Family history of ischemic heart disease and other diseases of the circulatory system: Secondary | ICD-10-CM | POA: Diagnosis not present

## 2015-03-09 DIAGNOSIS — I10 Essential (primary) hypertension: Secondary | ICD-10-CM | POA: Diagnosis not present

## 2015-03-09 DIAGNOSIS — E7800 Pure hypercholesterolemia, unspecified: Secondary | ICD-10-CM | POA: Diagnosis not present

## 2015-03-09 LAB — COMPREHENSIVE METABOLIC PANEL
ALT: 18 U/L (ref 6–29)
AST: 10 U/L (ref 10–35)
Albumin: 4.2 g/dL (ref 3.6–5.1)
Alkaline Phosphatase: 69 U/L (ref 33–130)
BUN: 17 mg/dL (ref 7–25)
CO2: 26 mmol/L (ref 20–31)
Calcium: 9.2 mg/dL (ref 8.6–10.4)
Chloride: 102 mmol/L (ref 98–110)
Creat: 0.88 mg/dL (ref 0.50–0.99)
Glucose, Bld: 99 mg/dL (ref 65–99)
Potassium: 4.3 mmol/L (ref 3.5–5.3)
Sodium: 139 mmol/L (ref 135–146)
Total Bilirubin: 0.2 mg/dL (ref 0.2–1.2)
Total Protein: 7.2 g/dL (ref 6.1–8.1)

## 2015-03-09 NOTE — Addendum Note (Signed)
Addended by: Eulis Foster on: 03/09/2015 02:56 PM   Modules accepted: Orders

## 2015-09-13 DIAGNOSIS — M1611 Unilateral primary osteoarthritis, right hip: Secondary | ICD-10-CM | POA: Diagnosis not present

## 2015-11-10 DIAGNOSIS — Z1231 Encounter for screening mammogram for malignant neoplasm of breast: Secondary | ICD-10-CM | POA: Diagnosis not present

## 2015-11-10 LAB — HM MAMMOGRAPHY

## 2015-11-11 ENCOUNTER — Encounter: Payer: Self-pay | Admitting: Family Medicine

## 2015-12-08 ENCOUNTER — Other Ambulatory Visit: Payer: Self-pay | Admitting: Cardiology

## 2015-12-08 DIAGNOSIS — E7800 Pure hypercholesterolemia, unspecified: Secondary | ICD-10-CM

## 2015-12-08 DIAGNOSIS — I10 Essential (primary) hypertension: Secondary | ICD-10-CM

## 2015-12-08 DIAGNOSIS — Z01818 Encounter for other preprocedural examination: Secondary | ICD-10-CM

## 2015-12-08 DIAGNOSIS — Z8249 Family history of ischemic heart disease and other diseases of the circulatory system: Secondary | ICD-10-CM

## 2015-12-09 MED FILL — MELOXICAM 15 MG TABLET: 15 | 30 days supply | Qty: 30 | Fill #0

## 2015-12-10 DIAGNOSIS — L570 Actinic keratosis: Secondary | ICD-10-CM | POA: Diagnosis not present

## 2015-12-10 DIAGNOSIS — I8391 Asymptomatic varicose veins of right lower extremity: Secondary | ICD-10-CM | POA: Diagnosis not present

## 2015-12-10 DIAGNOSIS — L814 Other melanin hyperpigmentation: Secondary | ICD-10-CM | POA: Diagnosis not present

## 2015-12-10 DIAGNOSIS — Z85828 Personal history of other malignant neoplasm of skin: Secondary | ICD-10-CM | POA: Diagnosis not present

## 2015-12-10 DIAGNOSIS — L821 Other seborrheic keratosis: Secondary | ICD-10-CM | POA: Diagnosis not present

## 2015-12-23 ENCOUNTER — Encounter: Payer: Self-pay | Admitting: Cardiology

## 2016-01-04 NOTE — Progress Notes (Signed)
Chief Complaint  Patient presents with  . Medicare Wellness    fasting AWV/CPE with pelvic. No concerns.    Lauren [name] is a 65 y.o. female who presents for annual exam, Medicare wellness visit and follow-up on chronic medical conditions.  She has the following concerns:  Hyperlipidemia/family history of CAD: She has been under the care of Dr. Nelson/cardiology since last year. She was sent for cardiac clearance prior to hip replacement.  She had a normal stress test, and was started on Crestor. She had problems initially with dizziness (and also with lipitor), but tolerates it fine as long as she takes it at night. She eats very few eggs, infrequent ice cream, doesn't eat cheese. She no longer eats red meat as often, maybe just twice a month. She denies having any chest pain, dyspnea, edema or palpitations.   She had right total hip replacement 1 year ago.  While some pain is better (easier to get in and out of cars), she still has pain with walking, stairs. She complains of muscle pain around the right hip, as well as right knee pain.  She has had cortisone shots to her right knee, which have been helpful.  The therapist "did a number on my ankle"--had a lot of swelling and pain, and still bothers her some.  Herpes Labialis--infrequently flares. She hasn't been in the sun much, and not having significant stress/anxiety, so rarely having problems.  Last flare was about 8 months ago.  Requesting refill today.  Immunization History  Administered Date(s) Administered  . Influenza Split 11/24/2010  . Influenza, High Dose Seasonal PF 10/29/2014  . Influenza,inj,Quad PF,36+ Mos 10/27/2013  . Pneumococcal Conjugate-13 10/29/2014  . Pneumococcal Polysaccharide-23 10/05/2008  . Td 02/28/2000  . Tdap 10/05/2008  . Zoster 11/24/2010   Had flu shot about 1.5 weeks ago at [facility] Last Pap smear: 09/2013, no high risk HPV detected Last mammogram: 10/2015 Last colonoscopy: 3/08 Last DEXA: 10/2014  ([facility])--T-1.6 Dentist: once or twice yearly Ophtho: none since LASIK over 10 years ago Exercise: home exercise program for her leg/hip; just through her job, walking the auctions 2x/week (3 hours each).  No regular aerobic or weight-bearing activity. Vitamin D level was normal in 2012  Other doctors caring for patient include: Dentist: Dr. Randol Kern Dermatologist: Dr. Rolm Bookbinder (goes yearly) Cardiologist: Dr. Meda Coffee GI: Dr. Collene Mares Ortho: Dr. Alvan Dame  Depression and fall screens: negative--see epic questionnaires. Functional Status Survey:  Notable only for difficulty walking since hip surgery.  End of Life Discussion:  Patient has a living will and medical power of attorney  Past Medical History:  Diagnosis Date  . Arthritis    hips-Right worse and right knee.  . Facial basal cell cancer    nose (Dr. Ubaldo Glassing), leg  . Herpes simplex labialis   . Hyperlipidemia   . Melanoma ([place])    right shoulder- 10 yrs ago- no further issues  . Squamous cell skin cancer    many sites (hands/legs)    Past Surgical History:  Procedure Laterality Date  . APPENDECTOMY    . CARDIOVASCULAR STRESS TEST  06-2005   Dr. Rex Kras  . CESAREAN SECTION  x2  . REFRACTIVE SURGERY  age 63  . REFRACTIVE SURGERY    . TOTAL HIP ARTHROPLASTY Right 01/19/2015   Procedure: TOTAL RIGHT HIP ARTHROPLASTY ANTERIOR APPROACH;  Surgeon: Paralee Cancel, MD;  Location: [facility];  Service: Orthopedics;  Laterality: Right;    Social History   Social History  . Marital status:  Divorced    Spouse name: N/A  . Number of children: 2  . Years of education: N/A   Occupational History  . 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 Smoking status: Never Smoker  . Smokeless tobacco: Never Used  . Alcohol use Yes     Comment: 1 drink every 6 months.  . Drug use: No  . Sexual activity: Not Currently    Partners: Male   Other Topics Concern  . Not on file   Social History Narrative   Lives  alone. Both sons live in [place]. 2 grandchildren, and 2 great-grandchildren who she watches them 2 days/week and on weekends. She is "semi-retired".    Family History  Problem Relation Age of Onset  . Heart disease Mother 51    MI  . COPD Mother   . Hyperlipidemia Mother   . Hypertension Mother   . Heart disease Sister   . Heart disease Sister   . Hyperlipidemia Sister   . Hypertension Sister   . Diabetes Sister   . Graves' disease Sister   . COPD Sister   . Heart disease Brother     76's  . Heart disease Brother   . COPD Brother   . Alcohol abuse Brother   . Diabetes Brother     borderline  . COPD Brother   . Alcohol abuse Father   . Stroke Father 73  . Diabetes Sister   . Heart disease Sister   . Heart disease Sister     CAD, s/p angioplasties  . Diabetes Sister   . Hyperlipidemia Sister   . Hypertension Sister   . Gout Sister   . Arthritis Sister     Outpatient Encounter Prescriptions as of 01/06/2016  Medication Sig Note  . aspirin EC 81 MG tablet Take 81 mg by mouth daily.   . ferrous sulfate 325 (65 FE) MG tablet Take 1 tablet (325 mg total) by mouth 3 (three) times daily after meals. 01/06/2016: Takes it once daily  . rosuvastatin (CRESTOR) 10 MG tablet TAKE 1 TABLET EVERY DAY   . tiZANidine (ZANAFLEX) 4 MG tablet TAKE 1 TABLET BY MOUTH EVERY 6 HOURS AS NEEDED FOR SPASMS 01/06/2016: Uses every 2-3 days  . [DISCONTINUED] methocarbamol (ROBAXIN) 500 MG tablet Take 1 tablet (500 mg total) by mouth every 6 (six) hours as needed for muscle spasms.   . cetirizine (ZYRTEC) 10 MG tablet Take 10 mg by mouth daily as needed for allergies.    . meloxicam (MOBIC) 15 MG tablet Take 15 mg by mouth daily. 01/06/2016: Doesn't like taking medications, so doesn't take this.  Uses tylenol as needed  . triamcinolone cream (KENALOG) 0.1 % APPLY TO LOWER LEGS TWICE A DAY AS NEEDED FOR DRY SKIN 01/04/2015: Received from: External Pharmacy  . valACYclovir (VALTREX) 500 MG tablet TAKE 1 TABLET  DAILY FOR PREVENTION.OR 4 TAB AT ONCE AND REPEAT IN 12 HRS(8 TOTAL PER COURSE-OUTBREAK (Patient not taking: Reported on 01/06/2016)   . [DISCONTINUED] docusate sodium (COLACE) 100 MG capsule Take 1 capsule (100 mg total) by mouth 2 (two) times daily. (Patient not taking: Reported on 01/06/2016)   . [DISCONTINUED] HYDROcodone-acetaminophen (NORCO) 7.5-325 MG tablet Take 1-2 tablets by mouth every 4 (four) hours as needed for moderate pain.   . [DISCONTINUED] polyethylene glycol (MIRALAX / GLYCOLAX) packet Take 17 g by mouth 2 (two) times daily.   . [DISCONTINUED] promethazine-dextromethorphan (PROMETHAZINE-DM) 6.25-15 MG/5ML syrup Take 5 mLs by  mouth at bedtime as needed for cough.    No facility-administered encounter medications on file as of 01/06/2016.     Allergies  Allergen Reactions  . Morphine And Related     Patient prefers not to received"became addictive to this many years ago"- Demerol is okay    ROS: The patient denies anorexia, fever, headaches, vision changes, decreased hearing, ear pain, sore throat, breast concerns, chest pain, palpitations, dizziness, syncope, dyspnea on exertion, cough, swelling, nausea, vomiting, diarrhea, constipation, abdominal pain, melena, hematochezia, indigestion/heartburn, hematuria, dysuria, vaginal bleeding, discharge, odor or itch, genital lesions, numbness, tingling, weakness, tremor, suspicious skin lesions (sees derm yearly), depression, anxiety, abnormal bleeding/bruising, or enlarged lymph nodes.  Some leakage of urine with sneeze/cough 8# weight gain since last year. She reports she had gained 15# and has started losing. +right knee, hip and ankle per HPI.  Some mild left hip also. +insomnia--waking up after 3 hours, sometimes can't get back to sleep.  PHYSICAL EXAM:  BP 138/86 (BP Location: Left Arm, Patient Position: Sitting, Cuff Size: Normal)   Pulse 64   Ht 5' 0.75" (1.543 m)   Wt 183 lb 12.8 oz (83.4 kg)   BMI 35.02 kg/m     General Appearance:  Alert, cooperative, no distress, appears stated age   Head:  Normocephalic, without obvious abnormality, atraumatic   Eyes:  PERRL, EOM's intact, fundi benign.   Ears:  Normal TM's and external ear canals   Nose:  Nares normal, mucosa normal, no drainage or sinus tenderness   Throat:  Lips, mucosa, and tongue normal; teeth and gums normal   Neck:  Supple, no lymphadenopathy; thyroid: no enlargement/tenderness/nodules; no carotid bruit or JVD   Back:  Spine nontender, no curvature, ROM normal, no CVA tenderness   Lungs:  Clear to auscultation bilaterally without wheezes, rales or ronchi; respirations unlabored   Chest Wall:  No tenderness or deformity   Heart:  Regular rate and rhythm, S1 and S2 normal, no murmur, rub  or gallop   Breast Exam:  No tenderness, masses, or nipple discharge or inversion. No axillary lymphadenopathy   Abdomen:  Soft, non-tender, nondistended, normoactive bowel sounds,  no masses, no hepatosplenomegaly   Genitalia:  Normal external genitalia without lesions. BUS and vagina normal; no cervical motion tenderness. No abnormal vaginal discharge. Uterus and adnexa not enlarged, nontender, no masses. Pap not performed   Rectal:  Normal tone, no masses or tenderness; guaiac negative stool   Extremities:  No clubbing, cyanosis or edema. Significantly improved ROM of right hip compared to last visit (and not painful).  WHSS right lateral hip  Pulses:  2+ and symmetric all extremities   Skin:  Skin color, texture, turgor normal, no lesions. scattered hyperpigmented macules (c/w solar lentigo) on forearms and lower legs bilaterally.   Lymph nodes:  Cervical, supraclavicular, and axillary nodes normal   Neurologic:  CNII-XII intact, normal strength, sensation and gait; reflexes 2+ and symmetric throughout   Psych:  Normal mood, affect, hygiene and grooming    ASSESSMENT/PLAN:  Medicare  annual wellness visit, initial - Plan: POCT Urinalysis Dipstick, Visual acuity screening  Herpes labialis - infrequent flare. Refill Valtrex--change to 1050m tablet since using prn only, and not daily. Proper dosing reviewed - Plan: valACYclovir (VALTREX) 1000 MG tablet  Pure hypercholesterolemia - on Crestor; forward copies of labs to her cardiologist, Dr. NMeda Coffee- Plan: Lipid panel  Family history of early CAD - Plan: Lipid panel  Osteopenia of spine - encouraged calcium, Vitamin D, weight-bearing  exercise. Recheck DEXA in 1-2 years  Medication monitoring encounter - Plan: CBC with Differential/Platelet, Comprehensive metabolic panel, Lipid panel  Weight gain - Plan: TSH  Immunization due - Plan: Pneumococcal polysaccharide vaccine 23-valent greater than or equal to 2yo subcutaneous/IM   c-met, lipids, TSH, CBC Send copies of labs to Dr. Meda Coffee.   If CBC is normal, will have patient stop her iron supplementation (shouldn't still be needed).  Pt advised not to take cipro while on tizandine (to be aware in case she goes to UC for UTI and forgets to mention she is on this prn med).  Stop drinking coffee in the evening--that should help with your sleeping problem.  BP was elevated on repeat (846 systolic)--had discussed stressful drive over [facility] wouldn't let her over lane while driving here), stress regarding causing the family to have problems [place] the [place]--couldn't find adequate care for the great grandchildren she usually watches all weekend.  Discussed setting limits with family--giving notice, them needing to have back-up plan. She is entitled to be able to take a weekend away.  Discussed low sodium diet, regular exercise and weight loss, and to periodically check her BP elsewhere.  If persistently >140, f/u here to discuss, re-eval.  Discussed monthly self breast exams and yearly mammograms; at least 30 minutes of aerobic activity at least  5 days/week, weight-bearing exercise 2x/week; proper sunscreen use reviewed; healthy diet, including goals of calcium and vitamin D intake and alcohol recommendations (less than or equal to 1 drink/day) reviewed; regular seatbelt use; changing batteries in smoke detectors. Immunization recommendations discussed-- pneumovax given today. Colonoscopy recommendations reviewed--UTD, needs to schedule again for March 2018.  MOST form discussed and completed.  Full Code, Full Care.  F/u 1 year, sooner prn.   Medicare Attestation I have personally reviewed: The patient's medical and social history Their use of alcohol, tobacco or illicit drugs Their current medications and supplements The patient's functional ability including ADLs,fall risks, home safety risks, cognitive, and hearing and visual impairment Diet and physical activities Evidence for depression or mood disorders  The patient's weight, height, and BMI have been recorded in the chart.  I have made referrals, counseling, and provided education to the patient based on review of the above and I have provided the patient with a written personalized care plan for preventive services.     Kennady Zimmerle A, MD   01/04/2016

## 2016-01-06 ENCOUNTER — Ambulatory Visit (INDEPENDENT_AMBULATORY_CARE_PROVIDER_SITE_OTHER): Payer: Medicare Other | Admitting: Family Medicine

## 2016-01-06 ENCOUNTER — Encounter: Payer: Self-pay | Admitting: Family Medicine

## 2016-01-06 VITALS — BP 138/86 | HR 64 | Ht 60.75 in | Wt 183.8 lb

## 2016-01-06 DIAGNOSIS — B001 Herpesviral vesicular dermatitis: Secondary | ICD-10-CM | POA: Diagnosis not present

## 2016-01-06 DIAGNOSIS — E7800 Pure hypercholesterolemia, unspecified: Secondary | ICD-10-CM | POA: Diagnosis not present

## 2016-01-06 DIAGNOSIS — R635 Abnormal weight gain: Secondary | ICD-10-CM

## 2016-01-06 DIAGNOSIS — Z23 Encounter for immunization: Secondary | ICD-10-CM | POA: Diagnosis not present

## 2016-01-06 DIAGNOSIS — Z0000 Encounter for general adult medical examination without abnormal findings: Secondary | ICD-10-CM | POA: Diagnosis not present

## 2016-01-06 DIAGNOSIS — Z5181 Encounter for therapeutic drug level monitoring: Secondary | ICD-10-CM | POA: Diagnosis not present

## 2016-01-06 DIAGNOSIS — M8588 Other specified disorders of bone density and structure, other site: Secondary | ICD-10-CM

## 2016-01-06 DIAGNOSIS — Z8249 Family history of ischemic heart disease and other diseases of the circulatory system: Secondary | ICD-10-CM

## 2016-01-06 LAB — CBC WITH DIFFERENTIAL/PLATELET
Basophils Absolute: 0 cells/uL (ref 0–200)
Basophils Relative: 0 %
EOS PCT: 1 %
Eosinophils Absolute: 59 cells/uL (ref 15–500)
HCT: 43.9 % (ref 35.0–45.0)
Hemoglobin: 14.6 g/dL (ref 11.7–15.5)
LYMPHS ABS: 1475 {cells}/uL (ref 850–3900)
LYMPHS PCT: 25 %
MCH: 29.9 pg (ref 27.0–33.0)
MCHC: 33.3 g/dL (ref 32.0–36.0)
MCV: 89.8 fL (ref 80.0–100.0)
MPV: 10.1 fL (ref 7.5–12.5)
Monocytes Absolute: 354 cells/uL (ref 200–950)
Monocytes Relative: 6 %
NEUTROS PCT: 68 %
Neutro Abs: 4012 cells/uL (ref 1500–7800)
Platelets: 329 10*3/uL (ref 140–400)
RBC: 4.89 MIL/uL (ref 3.80–5.10)
RDW: 13.7 % (ref 11.0–15.0)
WBC: 5.9 10*3/uL (ref 4.0–10.5)

## 2016-01-06 LAB — LIPID PANEL
Cholesterol: 232 mg/dL — ABNORMAL HIGH (ref <200)
HDL: 74 mg/dL (ref >50)
LDL CALC: 127 mg/dL — AB
Total CHOL/HDL Ratio: 3.1 Ratio (ref <5.0)
Triglycerides: 157 mg/dL — ABNORMAL HIGH (ref <150)
VLDL: 31 mg/dL — ABNORMAL HIGH (ref <30)

## 2016-01-06 LAB — COMPREHENSIVE METABOLIC PANEL
ALT: 16 U/L (ref 6–29)
AST: 11 U/L (ref 10–35)
Albumin: 4.8 g/dL (ref 3.6–5.1)
Alkaline Phosphatase: 64 U/L (ref 33–130)
BILIRUBIN TOTAL: 0.5 mg/dL (ref 0.2–1.2)
BUN: 10 mg/dL (ref 7–25)
CO2: 21 mmol/L (ref 20–31)
CREATININE: 0.83 mg/dL (ref 0.50–0.99)
Calcium: 9.6 mg/dL (ref 8.6–10.4)
Chloride: 104 mmol/L (ref 98–110)
GLUCOSE: 93 mg/dL (ref 65–99)
Potassium: 4.2 mmol/L (ref 3.5–5.3)
SODIUM: 139 mmol/L (ref 135–146)
Total Protein: 7.8 g/dL (ref 6.1–8.1)

## 2016-01-06 LAB — POCT URINALYSIS DIPSTICK
Bilirubin, UA: NEGATIVE
Glucose, UA: NEGATIVE
Ketones, UA: NEGATIVE
Leukocytes, UA: NEGATIVE
Nitrite, UA: NEGATIVE
PROTEIN UA: NEGATIVE
RBC UA: NEGATIVE
SPEC GRAV UA: 1.02
UROBILINOGEN UA: NEGATIVE
pH, UA: 6.5

## 2016-01-06 LAB — TSH: TSH: 1.13 m[IU]/L

## 2016-01-06 MED ORDER — VALACYCLOVIR HCL 1 G PO TABS: ORAL_TABLET | ORAL | 1 refills | Status: DC

## 2016-01-06 MED FILL — valACYclovir HCL 1 GM TABS: 1 | 8 days supply | Qty: 28 | Fill #0

## 2016-01-06 NOTE — Patient Instructions (Addendum)
HEALTH MAINTENANCE RECOMMENDATIONS:  It is recommended that you get at least 30 minutes of aerobic exercise at least 5 days/week (for weight loss, you may need as much as 60-90 minutes). This can be any activity that gets your heart rate up. This can be divided in 10-15 minute intervals if needed, but try and build up your endurance at least once a week.  Weight bearing exercise is also recommended twice weekly.  Eat a healthy diet with lots of vegetables, fruits and fiber.  "Colorful" foods have a lot of vitamins (ie green vegetables, tomatoes, red peppers, etc).  Limit sweet tea, regular sodas and alcoholic beverages, all of which has a lot of calories and sugar.  Up to 1 alcoholic drink daily may be beneficial for women (unless trying to lose weight, watch sugars).  Drink a lot of water.  Calcium recommendations are 1200-1500 mg daily (1500 mg for postmenopausal women or women without ovaries), and vitamin D 1000 IU daily.  This should be obtained from diet and/or supplements (vitamins), and calcium should not be taken all at once, but in divided doses.  Monthly self breast exams and yearly mammograms for women over the age of 73 is recommended.  Sunscreen of at least SPF 30 should be used on all sun-exposed parts of the skin when outside between the hours of 10 am and 4 pm (not just when at beach or pool, but even with exercise, golf, tennis, and yard work!)  Use a sunscreen that says "broad spectrum" so it covers both UVA and UVB rays, and make sure to reapply every 1-2 hours.  Remember to change the batteries in your smoke detectors when changing your clock times in the spring and fall.  Use your seat belt every time you are in a car, and please drive safely and not be distracted with cell phones and texting while driving.   [name] , Thank you for taking time to come for your Medicare Wellness Visit. I appreciate your ongoing commitment to your health goals. Please review the following  plan we discussed and let me know if I can assist you in the future.   These are the goals we discussed: Goals    None      This is a list of the screening recommended for you and due dates:  Health Maintenance  Topic Date Due  . Flu Shot  09/28/2015  . Pneumonia vaccines (2 of 2 - PPSV23) 10/29/2015  . Colon Cancer Screening  05/08/2016  . Mammogram  11/09/2017  . Tetanus Vaccine  10/06/2018  . DEXA scan (bone density measurement)  Completed  . Shingles Vaccine  Completed  .  Hepatitis C: One time screening is recommended by Center for Disease Control  (CDC) for  adults born from [birth year] through 1966.   Completed   You got your flu shot at the pharmacy; continue yearly flu shots--high dose (>65) is recommended. You were given the last of the pneumonia vaccines today You are due for repeat colonoscopy 04/2016 with Dr. Collene Mares Mammogram date above is incorrect. I recommend 3D mammograms yearly (due again in 10/2016).  You will need another bone density test in the next 1-2 years to follow up on osteopenia (bone thinning).   Stop drinking coffee in the evening--that should help with your sleeping problem.  Please schedule a routine eye exam.  I will let you know if you still need to take the iron supplement after I see the results of your blood counts.  I doubt you will continue to need this.  Remember to let other doctors know that you take tizanidine.  Do NOT take Cipro (commonly used for bladder infection) while on the tizanidine.

## 2016-01-07 ENCOUNTER — Encounter: Payer: Self-pay | Admitting: Cardiology

## 2016-01-07 ENCOUNTER — Ambulatory Visit (INDEPENDENT_AMBULATORY_CARE_PROVIDER_SITE_OTHER): Payer: Medicare Other | Admitting: Cardiology

## 2016-01-07 VITALS — BP 142/78 | HR 75 | Ht 60.0 in | Wt 188.0 lb

## 2016-01-07 DIAGNOSIS — Z8249 Family history of ischemic heart disease and other diseases of the circulatory system: Secondary | ICD-10-CM | POA: Diagnosis not present

## 2016-01-07 DIAGNOSIS — E7800 Pure hypercholesterolemia, unspecified: Secondary | ICD-10-CM

## 2016-01-07 DIAGNOSIS — I10 Essential (primary) hypertension: Secondary | ICD-10-CM

## 2016-01-07 MED FILL — MELOXICAM 15 MG TABLET: 15 | 30 days supply | Qty: 30 | Fill #1

## 2016-01-07 NOTE — Patient Instructions (Signed)

## 2016-01-07 NOTE — Progress Notes (Signed)
Patient ID: Lauren [name], female   DOB: [birth year], 65 y.o.   MRN: [id number]      Cardiology Office Note  Date:  01/07/2016   ID:  Lauren [name], Lauren [name] 1950-03-09, MRN [id number]  PCP:  Vikki Ports, MD  Cardiologist:   Ena Dawley, MD   Chief complain: One-year follow-up.   History of Present Illness: Lauren [name] is a 65 y.o. female is a very pleasant female who is coming for preoperative evaluation prior to the B/L hip replacement. She has developed severe arthritis and requires replacement. She is limited in physical activity in certain motions, however still able to work full time. She denies any chest pain. DOE with limited physical activity. No palpitations, syncope, no LE edema, PND. She has a very significant family history of premature CAD.  Her mother died of MI age age 50, father had CVA at 75, she is one of 10 siblings, no one made it beyond age 53 and several died of MI - 2 brothers had MIs in 58', received stents and died. Another sister had MI at age 37 and is alive at age 50.  She is not being treated for hyperlipidemia.  01/07/2016, the patient is coming after one year, a year ago she underwent total right hip replacement with complications of infection. Tilt is date patient is dealing with pain and difficulties to walk and is being followed by orthopedics. She hasn't been able to exercise as she used to in the past and gained some weight. She denies any chest pain or shortness of breath she has had no claudications no orthopnea or proximal nocturnal dyspnea. She hasn't been fully compliant with her statin and has been taking it approximately twice a week. She doesn't have side effects.  Past Medical History:  Diagnosis Date  . Arthritis    hips-Right worse and right knee.  . Facial basal cell cancer    nose (Dr. Ubaldo Glassing), leg  . Herpes simplex labialis   . Hyperlipidemia   . Melanoma ([place])    right shoulder- 10 yrs ago- no further issues  . Squamous cell skin  cancer    many sites (hands/legs)    Past Surgical History:  Procedure Laterality Date  . APPENDECTOMY    . CARDIOVASCULAR STRESS TEST  06-2005   Dr. Rex Kras  . CESAREAN SECTION  x2  . REFRACTIVE SURGERY  age 34  . REFRACTIVE SURGERY    . TOTAL HIP ARTHROPLASTY Right 01/19/2015   Procedure: TOTAL RIGHT HIP ARTHROPLASTY ANTERIOR APPROACH;  Surgeon: Paralee Cancel, MD;  Location: [facility];  Service: Orthopedics;  Laterality: Right;     Current Outpatient Prescriptions  Medication Sig Dispense Refill  . aspirin EC 81 MG tablet Take 81 mg by mouth daily.    . cetirizine (ZYRTEC) 10 MG tablet Take 10 mg by mouth daily as needed for allergies.     . ferrous sulfate 325 (65 FE) MG tablet Take 1 tablet (325 mg total) by mouth 3 (three) times daily after meals.  3  . meloxicam (MOBIC) 15 MG tablet Take 15 mg by mouth daily.  1  . rosuvastatin (CRESTOR) 10 MG tablet TAKE 1 TABLET EVERY DAY 90 tablet 2  . tiZANidine (ZANAFLEX) 4 MG tablet TAKE 1 TABLET BY MOUTH EVERY 6 HOURS AS NEEDED FOR SPASMS  0  . triamcinolone cream (KENALOG) 0.1 % APPLY TO LOWER LEGS TWICE A DAY AS NEEDED FOR DRY SKIN  1  . valACYclovir (VALTREX) 1000 MG tablet Take  two tablets at onset of cold sore.  Repeat dose in 12 hours (full course is 4 pills/24 hr). 28 tablet 1   No current facility-administered medications for this visit.     Allergies:   Morphine and related   Social History:  The patient  reports that she has never smoked. She has never used smokeless tobacco. She reports that she drinks alcohol. She reports that she does not use drugs.   Family History:  The patient's family history includes Alcohol abuse in her brother and father; Arthritis in her sister; COPD in her brother, brother, mother, and sister; Diabetes in her brother, sister, sister, and sister; Gout in her sister; Berenice Primas' disease in her sister; Heart disease in her brother, brother, sister, sister, sister, and sister; Heart disease (age of onset: 48)  in her mother; Hyperlipidemia in her mother, sister, and sister; Hypertension in her mother, sister, and sister; Stroke (age of onset: 83) in her father.   ROS:  Please see the history of present illness.   Otherwise, review of systems are positive for none.   All other systems are reviewed and negative.   PHYSICAL EXAM: VS:  BP (!) 142/78   Pulse 75   Ht 5' (1.524 m)   Wt 188 lb (85.3 kg)   BMI 36.72 kg/m  , BMI Body mass index is 36.72 kg/m. GEN: Well nourished, well developed, in no acute distress  HEENT: normal  Neck: no JVD, carotid bruits, or masses Cardiac: RRR; no murmurs, rubs, or gallops,no edema  Respiratory:  clear to auscultation bilaterally, normal work of breathing GI: soft, nontender, nondistended, + BS MS: no deformity or atrophy  Skin: warm and dry, no rash Neuro:  Strength and sensation are intact Psych: euthymic mood, full affect  EKG:  EKG is ordered today. The ekg ordered today demonstrates SR, LAD, otherwise normal ECG.   Recent Labs: 01/06/2016: ALT 16; BUN 10; Creat 0.83; Hemoglobin 14.6; Platelets 329; Potassium 4.2; Sodium 139; TSH 1.13    Lipid Panel    Component Value Date/Time   CHOL 232 (H) 01/06/2016 0855   TRIG 157 (H) 01/06/2016 0855   HDL 74 01/06/2016 0855   CHOLHDL 3.1 01/06/2016 0855   VLDL 31 (H) 01/06/2016 0855   LDLCALC 127 (H) 01/06/2016 0855   Wt Readings from Last 3 Encounters:  01/07/16 188 lb (85.3 kg)  01/06/16 183 lb 12.8 oz (83.4 kg)  01/19/15 179 lb 9 oz (81.4 kg)    Nuclear stress test:  The left ventricular ejection fraction is hyperdynamic (>65%).  Nuclear stress EF: 70%.  There was no ST segment deviation noted during stress.  Defect 1: There is a small defect of moderate severity present in the apex location. Likely due to apical thinning, as there is normal wall motion. Cannot exclude prior infarct.  The study is normal.  This is a low risk study.      ASSESSMENT AND PLAN:  1.  Significant family  history of premature coronary artery disease in 6 siblings including sudden cardiac death, a Lexiscan nuclear stress test a year ago showed no prior infarct and no ischemia. We'll continue aggressive medical management she is instructed to take her Crestor every day and is possible.  2. Hyperlipidemia - LDL 155 -decreased to 127 however still elevated she is advised to restart exercising soon as possible and take Crestor every day. LFTs were normal.  3. Essential hypertension - most probably whitecoat syndrome she also states she has been very stressed  today her blood pressures are always controlled in the past we'll recheck at the next visit no medication right now.  Follow up in 2 weeks.   Signed, Ena Dawley, MD  01/07/2016 9:41 AM    [address], [place], [place]  [address] Phone: [phone]; Fax: [phone]

## 2016-02-18 ENCOUNTER — Other Ambulatory Visit: Payer: Self-pay | Admitting: Pharmacist

## 2016-02-18 NOTE — Patient Outreach (Signed)
Outreach call to Lauren [name] regarding medication adherence to rosuvastatin. Called and spoke with patient. HIPAA identifiers verified and verbal consent received.  Lauren [name] reports that she takes her rosuvastatin once daily as directed. However, reports that she believes that she misses a dose of this medication several times per month. 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 Discuss with patient a variety of strategies for helping her to remember to take her medications such as using a pillbox, using a phone alarm and placing her medications where she will see them to help her to remember. Patient reports that she currently has a couple of weeks worth of rosuvastatin left. Discuss with patient the importance of medication adherence. Patient verbalizes understanding.   Patient denies any further medication questions/concerns at this time.  Harlow Asa, PharmD, [facility] [phone]

## 2016-02-22 IMAGING — CR DG HIP (WITH OR WITHOUT PELVIS) 2-3V*R*
3 series · 3 of 3 positions shown · non-contrast
Comparison: None.

CLINICAL DATA: Fall 2 years ago

EXAM:
DG HIP (WITH OR WITHOUT PELVIS) 2-3V RIGHT

[w pelvis upright]
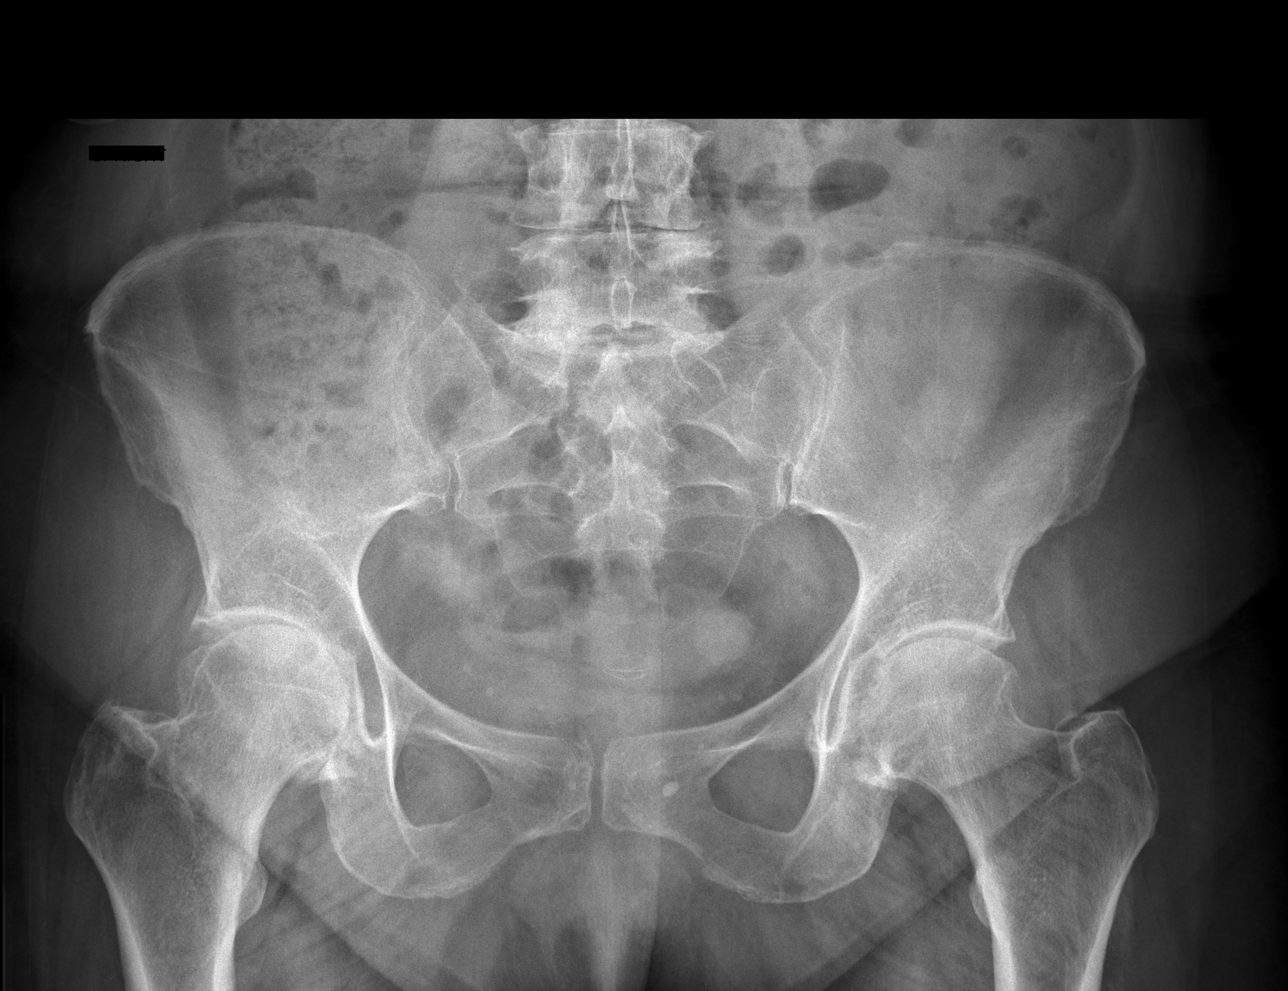

[w hip ap right]
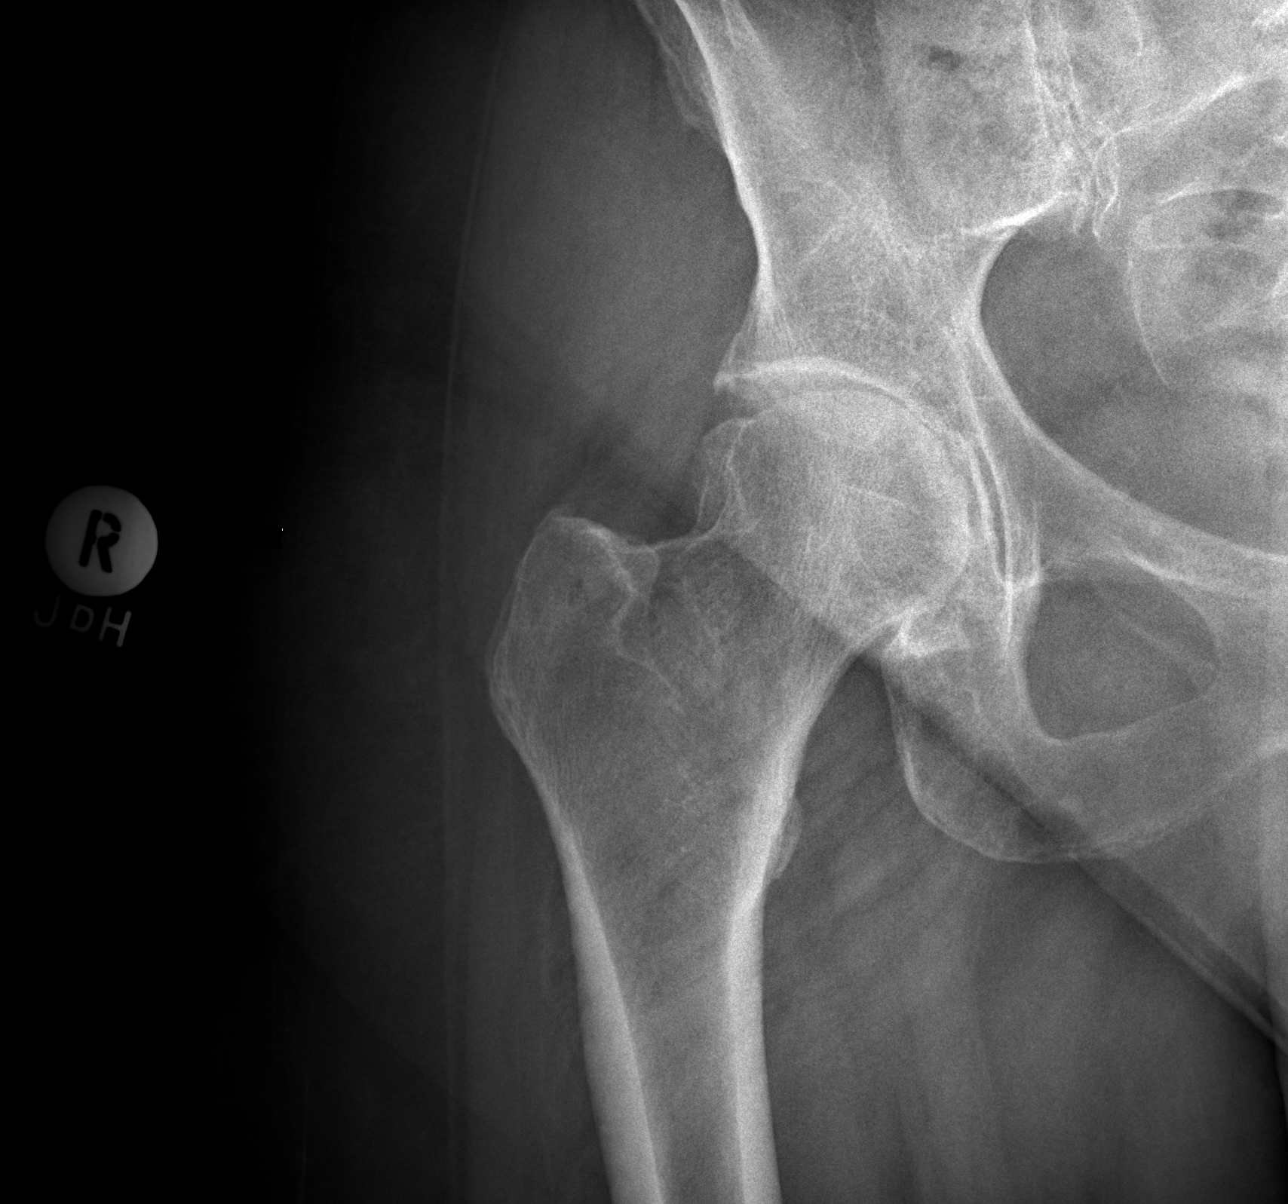

[w hip frog right]
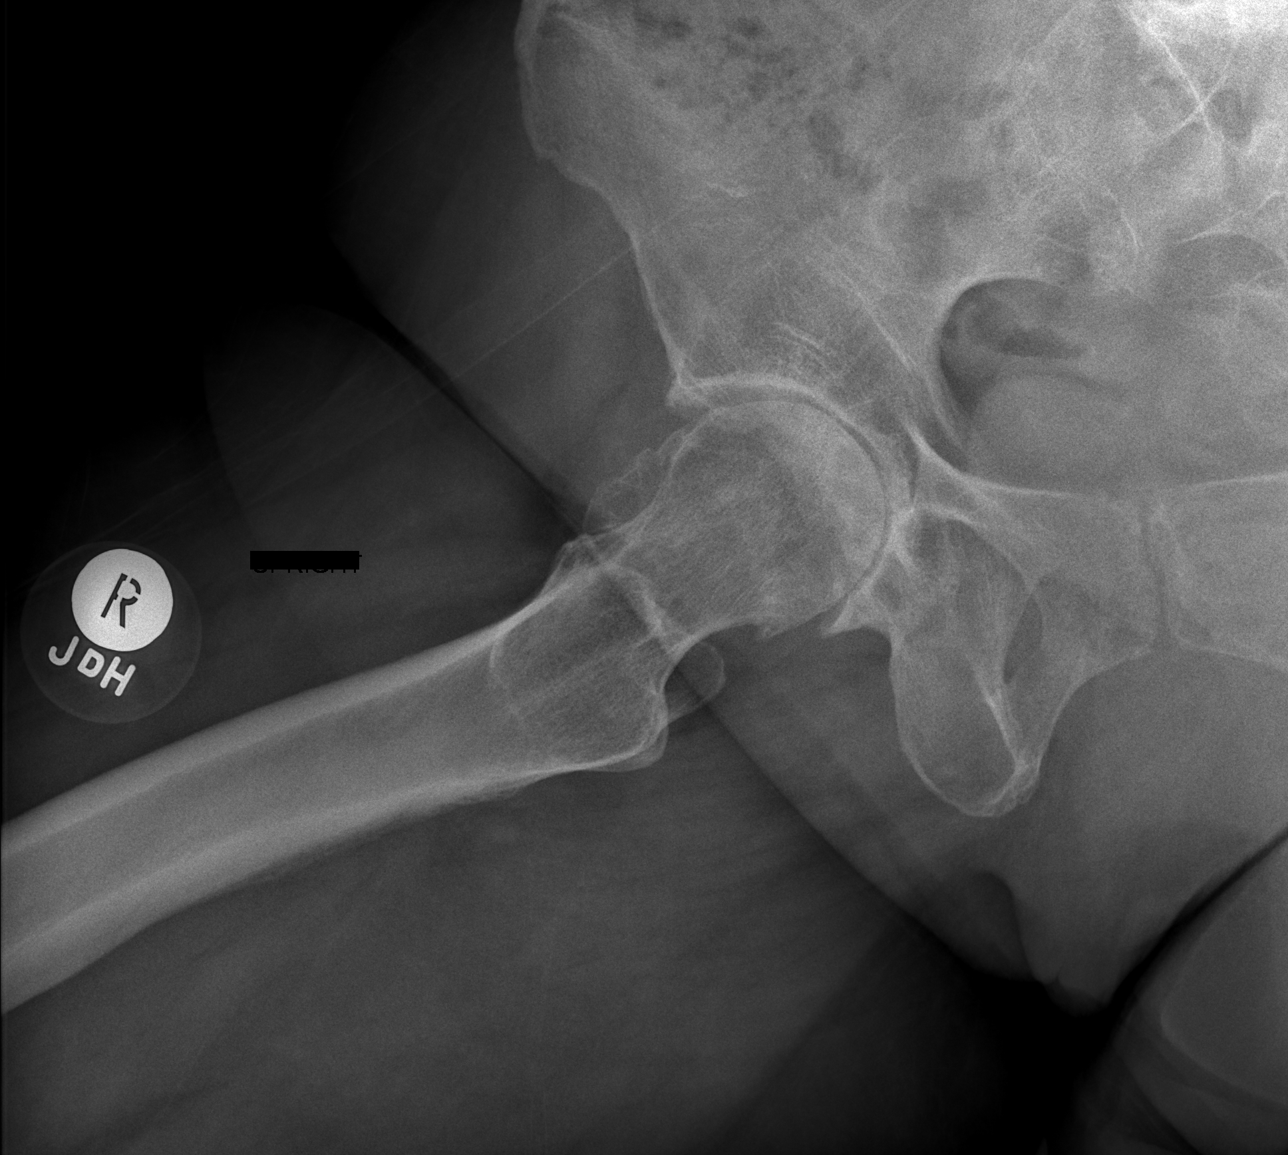

[3 of 3 positions shown; findings below may reference images not displayed]

FINDINGS: No acute fracture. No dislocation. Severe degenerative change of the
right hip joint. Moderate degenerative change of the left hip joint.
Degenerative changes in the lower lumbar spine are noted. No
destructive bone lesion.
IMPRESSION: No acute bony pathology.  Degenerative change.

## 2016-03-06 ENCOUNTER — Ambulatory Visit: Payer: Medicare Other | Admitting: Family Medicine

## 2016-03-27 MED FILL — valACYclovir HCL 1 GM TABS: 1 | 8 days supply | Qty: 28 | Fill #1

## 2016-04-29 IMAGING — DX DG HIP (WITH OR WITHOUT PELVIS) 1V PORT*R*
2 series · 2 of 2 positions shown · non-contrast
Comparison: November 13, 2014

CLINICAL DATA: Status post total hip replacement

EXAM:
DG HIP (WITH OR WITHOUT PELVIS) 2V PORT RIGHT

[pelvis ap]
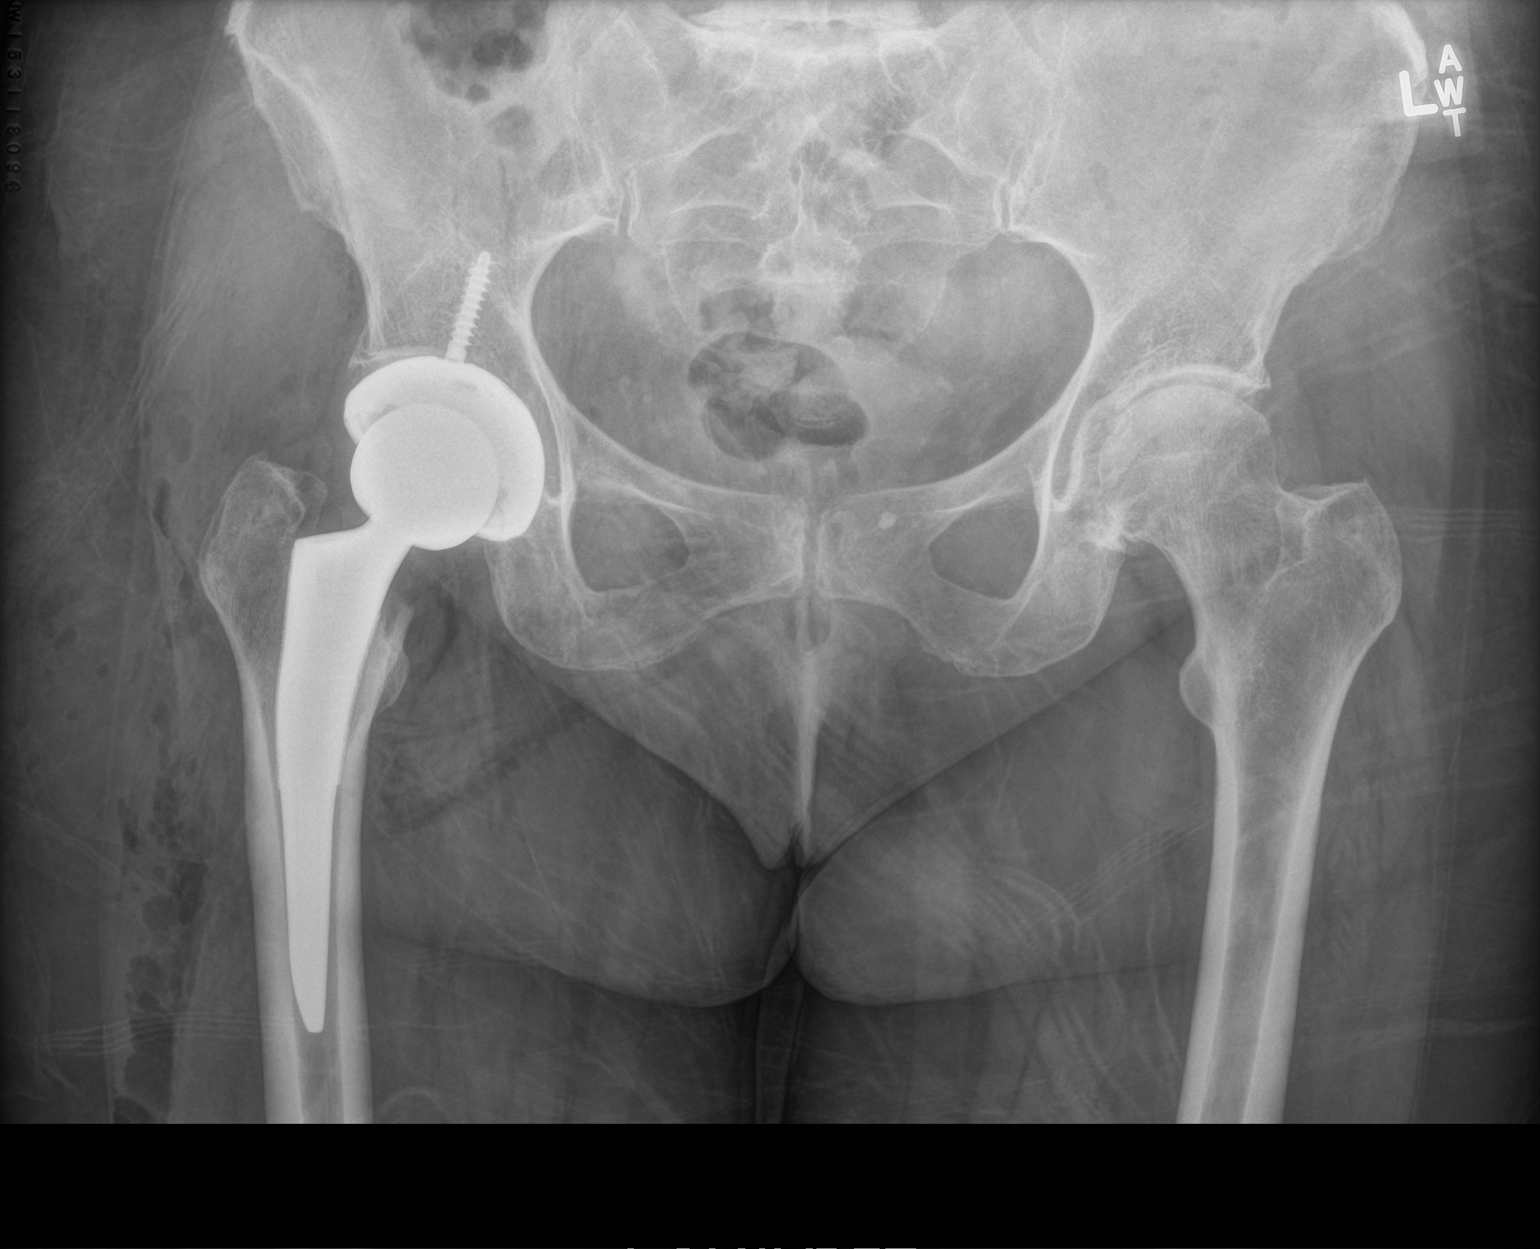

[hip x-table]
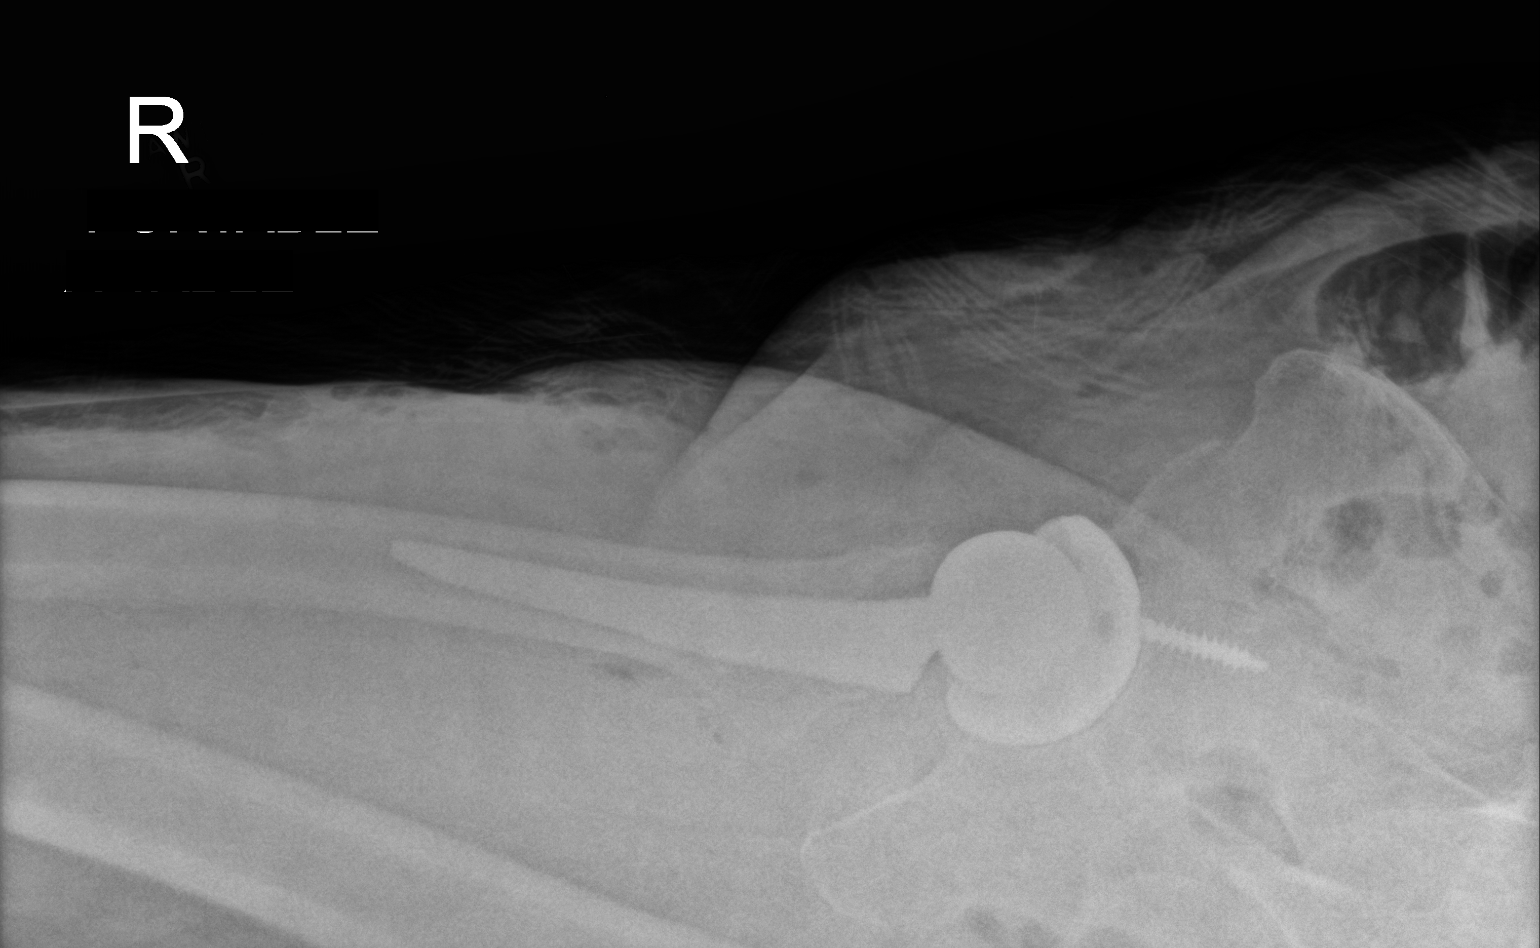

[2 of 2 positions shown; findings below may reference images not displayed]

FINDINGS: Frontal pelvis and lateral right hip images were obtained. There is
a total hip prosthesis on the right with prosthetic components
appearing well-seated. There is no acute fracture or dislocation.
There is moderate narrowing of the left hip joint. Soft tissue air
on the right is consistent with recent surgery.
IMPRESSION: Total hip replacement on the right with prosthetic components
well-seated. Moderate narrowing left hip joint. No acute fracture or
dislocation.

## 2016-05-25 ENCOUNTER — Ambulatory Visit (INDEPENDENT_AMBULATORY_CARE_PROVIDER_SITE_OTHER): Payer: Medicare Other | Admitting: Family Medicine

## 2016-05-25 ENCOUNTER — Encounter: Payer: Self-pay | Admitting: Family Medicine

## 2016-05-25 VITALS — BP 160/90 | HR 92 | Temp 101.7°F | Ht 60.0 in | Wt 185.6 lb

## 2016-05-25 DIAGNOSIS — R05 Cough: Secondary | ICD-10-CM | POA: Diagnosis not present

## 2016-05-25 DIAGNOSIS — R509 Fever, unspecified: Secondary | ICD-10-CM

## 2016-05-25 DIAGNOSIS — J101 Influenza due to other identified influenza virus with other respiratory manifestations: Secondary | ICD-10-CM

## 2016-05-25 DIAGNOSIS — R059 Cough, unspecified: Secondary | ICD-10-CM

## 2016-05-25 LAB — POC INFLUENZA A&B (BINAX/QUICKVUE)
INFLUENZA A, POC: NEGATIVE
INFLUENZA B, POC: POSITIVE — AB

## 2016-05-25 MED ORDER — OSELTAMIVIR PHOSPHATE 75 MG PO CAPS: 75.0000 mg | ORAL_CAPSULE | Freq: Two times a day (BID) | ORAL | 0 refills | Status: DC

## 2016-05-25 MED ORDER — BENZONATATE 200 MG PO CAPS: 200.0000 mg | ORAL_CAPSULE | Freq: Three times a day (TID) | ORAL | 0 refills | Status: DC | PRN

## 2016-05-25 MED FILL — BENZONATATE 200 MG CAPSULE: 200 | 10 days supply | Qty: 30 | Fill #0

## 2016-05-25 MED FILL — OSELTAMIVIR PHOS 75 MG CAP: 75 | 5 days supply | Qty: 10 | Fill #0

## 2016-05-25 NOTE — Patient Instructions (Addendum)
Drink plenty of water. Use tylenol for fever control and headaches.  If ineffective, you can also use ibuprofen or aleve as needed for pain/fever. Be sure to read your labels--many of the over-the-counter cold medications also contain acetaminophen--be sure not to take tylenol along with these. Restart an allergy medication--I suspect that your month-long cough is related to postnasal drainage.  Taking a daily medication such as either allegra, claritin or zyrtec might be helpful.  You can use a mucinex (plain or DM) or Robitussin (plain or DM) as needed. Your blood pressure was elevated today.  This may be related to your illness and the decongestants you've been taking.  Periodically check this, to make sure that your blood pressure comes down to normal when you're better.  You have Influenza B. We are prescribing Tamiflu to take twice daily for 5 days. Use the benzonatate as needed for cough. If your have fever that persists, worsening cough, shortness of breath, or other new symptoms develop, please return for re-evaluation.   Influenza, Adult Influenza, more commonly known as "the flu," is a viral infection that primarily affects the respiratory tract. The respiratory tract includes organs that help you breathe, such as the lungs, nose, and throat. The flu causes many common cold symptoms, as well as a high fever and body aches. The flu spreads easily from person to person (is contagious). Getting a flu shot (influenza vaccination) every year is the best way to prevent influenza. What are the causes? Influenza is caused by a virus. You can catch the virus by:  Breathing in droplets from an infected person's cough or sneeze.  Touching something that was recently contaminated with the virus and then touching your mouth, nose, or eyes. What increases the risk? The following factors may make you more likely to get the flu:  Not cleaning your hands frequently with soap and water or  alcohol-based hand sanitizer.  Having close contact with many people during cold and flu season.  Touching your mouth, eyes, or nose without washing or sanitizing your hands first.  Not drinking enough fluids or not eating a healthy diet.  Not getting enough sleep or exercise.  Being under a high amount of stress.  Not getting a yearly (annual) flu shot. You may be at a higher risk of complications from the flu, such as a severe lung infection (pneumonia), if you:  Are over the age of 61.  Are pregnant.  Have a weakened disease-fighting system (immune system). You may have a weakened immune system if you:  Have HIV or AIDS.  Are undergoing chemotherapy.  Aretaking medicines that reduce the activity of (suppress) the immune system.  Have a long-term (chronic) illness, such as heart disease, kidney disease, diabetes, or lung disease.  Have a liver disorder.  Are obese.  Have anemia. What are the signs or symptoms? Symptoms of this condition typically last 4-10 days and may include:  Fever.  Chills.  Headache, body aches, or muscle aches.  Sore throat.  Cough.  Runny or congested nose.  Chest discomfort and cough.  Poor appetite.  Weakness or tiredness (fatigue).  Dizziness.  Nausea or vomiting. How is this diagnosed? This condition may be diagnosed based on your medical history and a physical exam. Your health care provider may do a nose or throat swab test to confirm the diagnosis. How is this treated? If influenza is detected early, you can be treated with antiviral medicine that can reduce the length of your illness and the  severity of your symptoms. This medicine may be given by mouth (orally) or through an IV tube that is inserted in one of your veins. The goal of treatment is to relieve symptoms by taking care of yourself at home. This may include taking over-the-counter medicines, drinking plenty of fluids, and adding humidity to the air in your  home. In some cases, influenza goes away on its own. Severe influenza or complications from influenza may be treated in a hospital. Follow these instructions at home:  Take over-the-counter and prescription medicines only as told by your health care provider.  Use a cool mist humidifier to add humidity to the air in your home. This can make breathing easier.  Rest as needed.  Drink enough fluid to keep your urine clear or pale yellow.  Cover your mouth and nose when you cough or sneeze.  Wash your hands with soap and water often, especially after you cough or sneeze. If soap and water are not available, use hand sanitizer.  Stay home from work or school as told by your health care provider. Unless you are visiting your health care provider, try to avoid leaving home until your fever has been gone for 24 hours without the use of medicine.  Keep all follow-up visits as told by your health care provider. This is important. How is this prevented?  Getting an annual flu shot is the best way to avoid getting the flu. You may get the flu shot in late summer, fall, or winter. Ask your health care provider when you should get your flu shot.  Wash your hands often or use hand sanitizer often.  Avoid contact with people who are sick during cold and flu season.  Eat a healthy diet, drink plenty of fluids, get enough sleep, and exercise regularly. Contact a health care provider if:  You develop new symptoms.  You have:  Chest pain.  Diarrhea.  A fever.  Your cough gets worse.  You produce more mucus.  You feel nauseous or you vomit. Get help right away if:  You develop shortness of breath or difficulty breathing.  Your skin or nails turn a bluish color.  You have severe pain or stiffness in your neck.  You develop a sudden headache or sudden pain in your face or ear.  You cannot stop vomiting. This information is not intended to replace advice given to you by your health  care provider. Make sure you discuss any questions you have with your health care provider. Document Released: 02/11/2000 Document Revised: 07/22/2015 Document Reviewed: 12/08/2014 Elsevier Interactive Patient Education  2017 [employer].

## 2016-05-25 NOTE — Progress Notes (Signed)
Chief Complaint  Patient presents with  . Cough    x 1 month, mucus is milky to yellow in color. Has runny eyes and nose. Bad headache, teeth hurt. No body aches or chills. Has no energy. Feels worse at night.    Sunday/Monday (3-4 days ago) she started with a headache and congestion, sneezing.  Yesterday her sneezing got worse.  Fever started yesterday. Didn't check her temperature, but felt the same last night as she feels now.  Body aches also started yesterday. She has some sinus congestion, teeth pain.  Mucus is whitish yellow.   She has been coughing for a month.  Cough is worse in the evenings and at night, also coughs first thing in the morning.  Doesn't cough much at all during the day.  It is a nonproductive cough.    Denies heartburn. She has had some throat-clearing. She used to take zyrtec, but hasn't taken it in about 2 months.  She has been taking "all kind of OTC stuff"--Robitussin, Mucinex, Alka Selzer Plus (taken some decongestants).  PMH, PSH, SH reviewed  Outpatient Encounter Prescriptions as of 05/25/2016  Medication Sig Note  . aspirin EC 81 MG tablet Take 81 mg by mouth daily.   . cetirizine (ZYRTEC) 10 MG tablet Take 10 mg by mouth daily as needed for allergies.  05/25/2016: Hasn't taken for 2 months  . ferrous sulfate 325 (65 FE) MG tablet Take 1 tablet (325 mg total) by mouth 3 (three) times daily after meals. 01/06/2016: Takes it once daily  . Garlic 409 MG CAPS Take 1 capsule by mouth daily.   . Omega-3 Fatty Acids (FISH OIL) 1000 MG CAPS Take 1 capsule by mouth daily.   . rosuvastatin (CRESTOR) 10 MG tablet TAKE 1 TABLET EVERY DAY   . meloxicam (MOBIC) 15 MG tablet Take 15 mg by mouth daily. 05/25/2016: Uses very sporadically (leg pain).  Marland Kitchen tiZANidine (ZANAFLEX) 4 MG tablet TAKE 1 TABLET BY MOUTH EVERY 6 HOURS AS NEEDED FOR SPASMS 05/25/2016: Uses prn, infrequent  . triamcinolone cream (KENALOG) 0.1 % APPLY TO LOWER LEGS TWICE A DAY AS NEEDED FOR DRY SKIN 01/04/2015:  Received from: External Pharmacy  . valACYclovir (VALTREX) 1000 MG tablet Take two tablets at onset of cold sore.  Repeat dose in 12 hours (full course is 4 pills/24 hr). (Patient not taking: Reported on 05/25/2016)    No facility-administered encounter medications on file as of 05/25/2016.    Allergies  Allergen Reactions  . Morphine And Related     Patient prefers not to received"became addictive to this many years ago"- Demerol is okay    ROS:  +fever, headache, cough, watery eyes. No chest pain, shortness of breath (only after a coughing spell, not with exertion).  No bleeding, bruising, rash. +myalgias, no joint pain.  No urinary complaints (leakage with cough) See HPI  PHYSICAL EXAM:  BP (!) 160/90 (BP Location: Left Arm, Patient Position: Sitting, Cuff Size: Normal)   Pulse 92   Temp (!) 101.7 F (38.7 C) (Tympanic)   Ht 5' (1.524 m)   Wt 185 lb 9.6 oz (84.2 kg)   BMI 36.25 kg/m   Mildly ill appearing female, in no acute distress.  Speaking easily in full sentences, not significant coughing until respiratory exam. HEENT: conjunctiva and sclera are clear, PERRL EOMI. TM's and EAC's normal.  Nasal mucosa is mild-mod edematous, slightly red, clear mucus. Mildly tender over maxillary sinuses bilaterally. OP is clear Lungs--fair air movement.  Some cough with  deep breath, dry/hacky.  No wheezes, rales, ronchi Abdomen: soft, nontender Heart: regular rate and rhythm Neuro: alert and oriented, cranial nerves intact, normal gait. Psych: normal mood, affect, hygiene and grooming Skin: normal turgor, no rash  Influenza B+  ASSESSMENT/PLAN:  Influenza B - risks/side effects of Tamiflu reviewed; SE of complications reviewed--return if sx persist/worsen - Plan: oseltamivir (TAMIFLU) 75 MG capsule  Cough - currently due to flu; prior cough--hard to say due to flu complicating exam.  poss related to PND/allergies. reviewed treatments. - Plan: POC Influenza A&B (Binax test), benzonatate  (TESSALON) 200 MG capsule  Fever, unspecified fever cause - Plan: POC Influenza A&B (Binax test)   Drink plenty of water. Use tylenol for fever control and headaches.  If ineffective, you can also use ibuprofen or aleve as needed for pain/fever. Be sure to read your labels--many of the over-the-counter cold medications also contain acetaminophen--be sure not to take tylenol along with these. Restart an allergy medication--I suspect that your month-long cough is related to postnasal drainage.  Taking a daily medication such as either allegra, claritin or zyrtec might be helpful.  You can use a mucinex (plain or DM) or Robitussin (plain or DM) as needed. Your blood pressure was elevated today.  This may be related to your illness and the decongestants you've been taking.  Periodically check this, to make sure that your blood pressure comes down to normal when you're better.  You have Influenza B. We are prescribing Tamiflu to take twice daily for 5 days. If your have fever that persists, worsening cough, shortness of breath, or other new symptoms develop, please return for re-evaluation.

## 2016-05-26 ENCOUNTER — Encounter: Payer: Self-pay | Admitting: Family Medicine

## 2016-06-08 DIAGNOSIS — Z1211 Encounter for screening for malignant neoplasm of colon: Secondary | ICD-10-CM | POA: Diagnosis not present

## 2016-06-26 DIAGNOSIS — H524 Presbyopia: Secondary | ICD-10-CM | POA: Diagnosis not present

## 2016-07-19 MED FILL — GAVILYTE-G SOLUTION: 236 | 1 days supply | Qty: 4000 | Fill #0

## 2016-07-19 NOTE — Progress Notes (Deleted)
Cardiology Office Note    Date:  07/19/2016   ID:  Kamaryn, Grimley 08-28-1950, MRN [id number]  PCP:  Rita Ohara, MD  Cardiologist: Dr. Meda Coffee  No chief complaint on file.   History of Present Illness:  Lauren [name] is a 66 y.o. female with history of significant family history of premature CAD in 6 siblings including sudden cardiac death. Lexi scan stress test Feb 25, 2015 showed no ischemia or infarct. Aggressive medical management including Crestor for hyperlipidemia. She also has hypertension and blood pressure was elevated last office visit Feb 25, 2016. Was most probably due to white coat syndrome. She was also very stressed that day. She was supposed to have it rechecked. She had no prior history of hypertension was not on medications.    Past Medical History:  Diagnosis Date  . Arthritis    hips-Right worse and right knee.  . Facial basal cell cancer    nose (Dr. Ubaldo Glassing), leg  . Herpes simplex labialis   . Hyperlipidemia   . Melanoma ([place])    right shoulder- 10 yrs ago- no further issues  . Squamous cell skin cancer    many sites (hands/legs)    Past Surgical History:  Procedure Laterality Date  . APPENDECTOMY    . CARDIOVASCULAR STRESS TEST  06-2005   Dr. Rex Kras  . CESAREAN SECTION  x2  . REFRACTIVE SURGERY  age 79  . REFRACTIVE SURGERY    . TOTAL HIP ARTHROPLASTY Right 01/19/2015   Procedure: TOTAL RIGHT HIP ARTHROPLASTY ANTERIOR APPROACH;  Surgeon: Paralee Cancel, MD;  Location: [facility];  Service: Orthopedics;  Laterality: Right;    Current Medications: Outpatient Medications Prior to Visit  Medication Sig Dispense Refill  . aspirin EC 81 MG tablet Take 81 mg by mouth daily.    . benzonatate (TESSALON) 200 MG capsule Take 1 capsule (200 mg total) by mouth 3 (three) times daily as needed. 30 capsule 0  . cetirizine (ZYRTEC) 10 MG tablet Take 10 mg by mouth daily as needed for allergies.     . ferrous sulfate 325 (65 FE) MG tablet Take 1 tablet (325 mg total) by  mouth 3 (three) times daily after meals.  3  . Garlic 921 MG CAPS Take 1 capsule by mouth daily.    . meloxicam (MOBIC) 15 MG tablet Take 15 mg by mouth daily.  1  . Omega-3 Fatty Acids (FISH OIL) 1000 MG CAPS Take 1 capsule by mouth daily.    Marland Kitchen oseltamivir (TAMIFLU) 75 MG capsule Take 1 capsule (75 mg total) by mouth 2 (two) times daily. 10 capsule 0  . rosuvastatin (CRESTOR) 10 MG tablet TAKE 1 TABLET EVERY DAY 90 tablet 2  . tiZANidine (ZANAFLEX) 4 MG tablet TAKE 1 TABLET BY MOUTH EVERY 6 HOURS AS NEEDED FOR SPASMS  0  . triamcinolone cream (KENALOG) 0.1 % APPLY TO LOWER LEGS TWICE A DAY AS NEEDED FOR DRY SKIN  1  . valACYclovir (VALTREX) 1000 MG tablet Take two tablets at onset of cold sore.  Repeat dose in 12 hours (full course is 4 pills/24 hr). (Patient not taking: Reported on 05/25/2016) 28 tablet 1   No facility-administered medications prior to visit.      Allergies:   Morphine and related   Social History   Social History  . Marital status: Divorced    Spouse name: N/A  . Number of children: 2  . Years of education: N/A   Occupational History  . 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 Smoking status: Never Smoker  . Smokeless tobacco: Never Used  . Alcohol use Yes     Comment: 1 drink every 6 months.  . Drug use: No  . Sexual activity: Not Currently    Partners: Male   Other Topics Concern  . Not on file   Social History Narrative   Lives alone. Both sons live in [place]. 2 grandchildren, and 2 great-grandchildren who she watches them 2 days/week and on weekends. She is "semi-retired".     Family History:  The patient's ***family history includes Alcohol abuse in her brother and father; Arthritis in her sister; COPD in her brother, brother, mother, and sister; Diabetes in her brother, sister, sister, and sister; Gout in her sister; Berenice Primas' disease in her sister; Heart disease in her brother, brother, sister, sister, sister, and sister;  Heart disease (age of onset: 26) in her mother; Hyperlipidemia in her mother, sister, and sister; Hypertension in her mother, sister, and sister; Stroke (age of onset: 34) in her father.   ROS:   Please see the history of present illness.    ROS All other systems reviewed and are negative.   PHYSICAL EXAM:   VS:  There were no vitals taken for this visit.  Physical Exam  GEN: Well nourished, well developed, in no acute distress HEENT: normal Neck: no JVD, carotid bruits, or masses Cardiac:RRR; no murmurs, rubs, or gallops  Respiratory:  clear to auscultation bilaterally, normal work of breathing GI: soft, nontender, nondistended, + BS Ext: without cyanosis, clubbing, or edema, Good distal pulses bilaterally MS: no deformity or atrophy Skin: warm and dry, no rash Neuro:  Alert and Oriented x 3, Strength and sensation are intact Psych: euthymic mood, full affect  Wt Readings from Last 3 Encounters:  05/25/16 185 lb 9.6 oz (84.2 kg)  01/07/16 188 lb (85.3 kg)  01/06/16 183 lb 12.8 oz (83.4 kg)      Studies/Labs Reviewed:   EKG:  EKG is*** ordered today.  The ekg ordered today demonstrates ***  Recent Labs: 01/06/2016: ALT 16; BUN 10; Creat 0.83; Hemoglobin 14.6; Platelets 329; Potassium 4.2; Sodium 139; TSH 1.13   Lipid Panel    Component Value Date/Time   CHOL 232 (H) 01/06/2016 0855   TRIG 157 (H) 01/06/2016 0855   HDL 74 01/06/2016 0855   CHOLHDL 3.1 01/06/2016 0855   VLDL 31 (H) 01/06/2016 0855   LDLCALC 127 (H) 01/06/2016 0855    Additional studies/ records that were reviewed today include:   Nuclear stress test:12/2014  The left ventricular ejection fraction is hyperdynamic (>65%).  Nuclear stress EF: 70%.  There was no ST segment deviation noted during stress.  Defect 1: There is a small defect of moderate severity present in the apex location. Likely due to apical thinning, as there is normal wall motion. Cannot exclude prior infarct.  The study is  normal.  This is a low risk study.      ASSESSMENT:    1. Family history of early CAD   2. Essential hypertension   3. Pure hypercholesterolemia      PLAN:  In order of problems listed above:      Medication Adjustments/Labs and Tests Ordered: Current medicines are reviewed at length with the patient today.  Concerns regarding medicines are outlined above.  Medication changes, Labs and Tests ordered today are listed in the Patient Instructions below. There are no Patient Instructions on file for this visit.  Sumner Boast, PA-C  07/19/2016 8:44 AM    [facility] [address], [place], [place]  [address] Phone: [phone]; Fax: [phone]

## 2016-07-20 ENCOUNTER — Ambulatory Visit: Payer: Medicare Other | Admitting: Physician Assistant

## 2016-07-28 DIAGNOSIS — Z1211 Encounter for screening for malignant neoplasm of colon: Secondary | ICD-10-CM | POA: Diagnosis not present

## 2016-07-28 DIAGNOSIS — K5730 Diverticulosis of large intestine without perforation or abscess without bleeding: Secondary | ICD-10-CM | POA: Diagnosis not present

## 2016-07-28 LAB — HM COLONOSCOPY

## 2016-07-31 ENCOUNTER — Encounter: Payer: Self-pay | Admitting: *Deleted

## 2016-08-07 ENCOUNTER — Ambulatory Visit: Payer: Medicare Other | Admitting: Physician Assistant

## 2016-08-15 DIAGNOSIS — L570 Actinic keratosis: Secondary | ICD-10-CM | POA: Diagnosis not present

## 2016-08-15 DIAGNOSIS — Z85828 Personal history of other malignant neoplasm of skin: Secondary | ICD-10-CM | POA: Diagnosis not present

## 2016-08-15 DIAGNOSIS — L821 Other seborrheic keratosis: Secondary | ICD-10-CM | POA: Diagnosis not present

## 2016-08-21 ENCOUNTER — Telehealth: Payer: Self-pay

## 2016-08-21 ENCOUNTER — Other Ambulatory Visit: Payer: Self-pay | Admitting: *Deleted

## 2016-08-21 DIAGNOSIS — I10 Essential (primary) hypertension: Secondary | ICD-10-CM

## 2016-08-21 DIAGNOSIS — B001 Herpesviral vesicular dermatitis: Secondary | ICD-10-CM

## 2016-08-21 DIAGNOSIS — Z01818 Encounter for other preprocedural examination: Secondary | ICD-10-CM

## 2016-08-21 DIAGNOSIS — Z8249 Family history of ischemic heart disease and other diseases of the circulatory system: Secondary | ICD-10-CM

## 2016-08-21 DIAGNOSIS — E7800 Pure hypercholesterolemia, unspecified: Secondary | ICD-10-CM

## 2016-08-21 MED ORDER — VALACYCLOVIR HCL 1 G PO TABS: ORAL_TABLET | ORAL | 1 refills | Status: DC

## 2016-08-21 MED ORDER — ROSUVASTATIN CALCIUM 10 MG PO TABS: 10.0000 mg | ORAL_TABLET | Freq: Every day | ORAL | 2 refills | Status: DC

## 2016-08-21 NOTE — Telephone Encounter (Signed)
Is this okay?

## 2016-08-21 NOTE — Telephone Encounter (Signed)
Sent to [facility]

## 2016-08-21 NOTE — Telephone Encounter (Signed)
Okay to refill #28 with add'l refill to [facility]

## 2016-08-21 NOTE — Addendum Note (Signed)
Addended by: Carolee Rota F on: 08/21/2016 05:03 PM   Modules accepted: Orders

## 2016-08-21 NOTE — Telephone Encounter (Signed)
Pt needs valacyclovir called to [facility]. [place] December

## 2016-11-02 ENCOUNTER — Telehealth: Payer: Self-pay | Admitting: *Deleted

## 2016-11-02 NOTE — Telephone Encounter (Signed)
Patient called and stated that she got a letter from [facility] to call and schedule a DEXA and to get an order from her PCP. Looked at last CPE from 01-06-16 and you said DEXA 1-2 years. Patient states that she is feeling fine, not having any issues, taking her calcium and vitamin D and would really prefer to wait the 2 years. If that is okay I can call [facility] and let them know. If not, she will need an order faxed.

## 2016-11-02 NOTE — Telephone Encounter (Signed)
Called patient and Lauren [name] and took care of this.

## 2016-11-02 NOTE — Telephone Encounter (Signed)
I'm fine with waiting another year

## 2016-11-16 DIAGNOSIS — C4492 Squamous cell carcinoma of skin, unspecified: Secondary | ICD-10-CM

## 2016-11-16 DIAGNOSIS — L570 Actinic keratosis: Secondary | ICD-10-CM | POA: Diagnosis not present

## 2016-11-16 DIAGNOSIS — D0472 Carcinoma in situ of skin of left lower limb, including hip: Secondary | ICD-10-CM | POA: Diagnosis not present

## 2016-11-16 DIAGNOSIS — C44729 Squamous cell carcinoma of skin of left lower limb, including hip: Secondary | ICD-10-CM | POA: Diagnosis not present

## 2016-11-16 HISTORY — DX: Squamous cell carcinoma of skin, unspecified: C44.92

## 2016-11-23 ENCOUNTER — Encounter: Payer: Self-pay | Admitting: *Deleted

## 2016-11-23 DIAGNOSIS — Z96641 Presence of right artificial hip joint: Secondary | ICD-10-CM | POA: Diagnosis not present

## 2016-11-23 DIAGNOSIS — Z471 Aftercare following joint replacement surgery: Secondary | ICD-10-CM | POA: Diagnosis not present

## 2016-11-24 ENCOUNTER — Encounter: Payer: Self-pay | Admitting: Family Medicine

## 2016-11-30 DIAGNOSIS — Z1231 Encounter for screening mammogram for malignant neoplasm of breast: Secondary | ICD-10-CM | POA: Diagnosis not present

## 2016-11-30 LAB — HM MAMMOGRAPHY

## 2016-12-11 ENCOUNTER — Other Ambulatory Visit: Payer: Self-pay | Admitting: Family Medicine

## 2016-12-11 NOTE — Telephone Encounter (Signed)
Patient needs refill on this- last one was sent to mail order for #28 with 1 refill (she never used the refill), she would like to have this at local pharmacy if okay.

## 2016-12-11 NOTE — Telephone Encounter (Signed)
This dose requested was for 500mg .  I changed it to the 1000mg  that was sent to her mail order, with the same directons as last rx

## 2016-12-12 ENCOUNTER — Other Ambulatory Visit: Payer: Self-pay | Admitting: Family Medicine

## 2016-12-12 DIAGNOSIS — B001 Herpesviral vesicular dermatitis: Secondary | ICD-10-CM

## 2016-12-15 ENCOUNTER — Telehealth: Payer: Self-pay | Admitting: Family Medicine

## 2016-12-15 NOTE — Telephone Encounter (Signed)
Received fax refill request from [facility] for Valacyclovir Same rx was just sent to [facility] on 10/15 Called pt, [facility] She is changing pharmacies to [facility] as her primary . Advised her that I would make it primary pharmacy in her chart but to try to remember to tell us, if she calls for any refills Will advise [facility] that we are not filling this rx at this time since she just picked up Valtrex from [facility]

## 2016-12-15 NOTE — Telephone Encounter (Signed)
Pt will call when she needs next valtrex rx and we can send to new pharmacy

## 2016-12-15 NOTE — Telephone Encounter (Signed)
  Fax refill request from [facility] ( new pharmacy for pt)  Valacyclovir HCL 1 gram #28   per pharmacy note on fax, this is an advance request to insure uninterrupted availability of this prescriptions

## 2016-12-15 NOTE — Telephone Encounter (Signed)
The prescription that was just filled should cover 7 outbreaks.  Patient should contact us when she has 4 left, rather than refilling now, as this may last quite a while.

## 2017-01-04 DIAGNOSIS — Z471 Aftercare following joint replacement surgery: Secondary | ICD-10-CM | POA: Diagnosis not present

## 2017-01-04 DIAGNOSIS — M25551 Pain in right hip: Secondary | ICD-10-CM | POA: Diagnosis not present

## 2017-01-04 DIAGNOSIS — Z96641 Presence of right artificial hip joint: Secondary | ICD-10-CM | POA: Diagnosis not present
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
## 2017-01-11 ENCOUNTER — Ambulatory Visit (INDEPENDENT_AMBULATORY_CARE_PROVIDER_SITE_OTHER): Payer: Medicare Other | Admitting: Family Medicine

## 2017-01-11 ENCOUNTER — Encounter: Payer: Self-pay | Admitting: Family Medicine

## 2017-01-11 ENCOUNTER — Other Ambulatory Visit (HOSPITAL_COMMUNITY)
Admission: RE | Admit: 2017-01-11 | Discharge: 2017-01-11 | Disposition: A | Discharge status: Home / Self Care | Payer: Medicare Other | Source: Ambulatory Visit | Attending: Family Medicine | Admitting: Family Medicine

## 2017-01-11 VITALS — BP 144/88 | HR 76 | Ht 59.75 in | Wt 188.4 lb

## 2017-01-11 DIAGNOSIS — R030 Elevated blood-pressure reading, without diagnosis of hypertension: Secondary | ICD-10-CM | POA: Diagnosis not present

## 2017-01-11 DIAGNOSIS — E7800 Pure hypercholesterolemia, unspecified: Secondary | ICD-10-CM

## 2017-01-11 DIAGNOSIS — Z23 Encounter for immunization: Secondary | ICD-10-CM

## 2017-01-11 DIAGNOSIS — Z8249 Family history of ischemic heart disease and other diseases of the circulatory system: Secondary | ICD-10-CM | POA: Diagnosis not present

## 2017-01-11 DIAGNOSIS — Z5181 Encounter for therapeutic drug level monitoring: Secondary | ICD-10-CM

## 2017-01-11 DIAGNOSIS — E669 Obesity, unspecified: Secondary | ICD-10-CM

## 2017-01-11 DIAGNOSIS — M8588 Other specified disorders of bone density and structure, other site: Secondary | ICD-10-CM | POA: Diagnosis not present

## 2017-01-11 DIAGNOSIS — Z0000 Encounter for general adult medical examination without abnormal findings: Secondary | ICD-10-CM | POA: Insufficient documentation

## 2017-01-11 DIAGNOSIS — B001 Herpesviral vesicular dermatitis: Secondary | ICD-10-CM

## 2017-01-11 LAB — POCT URINALYSIS DIP (PROADVANTAGE DEVICE)
Bilirubin, UA: NEGATIVE
Blood, UA: NEGATIVE
Glucose, UA: NEGATIVE mg/dL
Ketones, POC UA: NEGATIVE mg/dL
Nitrite, UA: NEGATIVE
PH UA: 6 (ref 5.0–8.0)
PROTEIN UA: NEGATIVE mg/dL
Specific Gravity, Urine: 1.025
UUROB: NEGATIVE

## 2017-01-11 NOTE — Patient Instructions (Addendum)
HEALTH MAINTENANCE RECOMMENDATIONS:  It is recommended that you get at least 30 minutes of aerobic exercise at least 5 days/week (for weight loss, you may need as much as 60-90 minutes). This can be any activity that gets your heart rate up. This can be divided in 10-15 minute intervals if needed, but try and build up your endurance at least once a week.  Weight bearing exercise is also recommended twice weekly.  Eat a healthy diet with lots of vegetables, fruits and fiber.  "Colorful" foods have a lot of vitamins (ie green vegetables, tomatoes, red peppers, etc).  Limit sweet tea, regular sodas and alcoholic beverages, all of which has a lot of calories and sugar.  Up to 1 alcoholic drink daily may be beneficial for women (unless trying to lose weight, watch sugars).  Drink a lot of water.  Calcium recommendations are 1200-1500 mg daily (1500 mg for postmenopausal women or women without ovaries), and vitamin D 1000 IU daily.  This should be obtained from diet and/or supplements (vitamins), and calcium should not be taken all at once, but in divided doses.  Monthly self breast exams and yearly mammograms for women over the age of 74 is recommended.  Sunscreen of at least SPF 30 should be used on all sun-exposed parts of the skin when outside between the hours of 10 am and 4 pm (not just when at beach or pool, but even with exercise, golf, tennis, and yard work!)  Use a sunscreen that says "broad spectrum" so it covers both UVA and UVB rays, and make sure to reapply every 1-2 hours.  Remember to change the batteries in your smoke detectors when changing your clock times in the spring and fall.  Use your seat belt every time you are in a car, and please drive safely and not be distracted with cell phones and texting while driving.   Lauren [name] , Thank you for taking time to come for your Medicare Wellness Visit. I appreciate your ongoing commitment to your health goals. Please review the following  plan we discussed and let me know if I can assist you in the future.   These are the goals we discussed: Goals    None      This is a list of the screening recommended for you and due dates:  Health Maintenance  Topic Date Due  . Flu Shot  09/27/2016  . Tetanus Vaccine  10/06/2018  . Mammogram  12/01/2018  . Colon Cancer Screening  07/29/2023  . DEXA scan (bone density measurement)  Completed  .  Hepatitis C: One time screening is recommended by Center for Disease Control  (CDC) for  adults born from [birth year] through 1966.   Completed  . Pneumonia vaccines  Completed   You were given your flu shot today. I recommend yearly mammograms, next due 11/2017 (not 2020 as stated above). You just had colonoscopy done--it is up to Dr. Collene Mares to determine if/when next is due (usually is 10 years if low risk, which is NOT the date stated above). We should repeat a bone density test next year (especially if ongoing prednisone use which weakens the bones).  I recommend getting the new shingles vaccine (Shingrix). You will need to check with your insurance to see if it is covered, and if covered by Medicare Part D, you need to get from the pharmacy rather than our office.  It is a series of 2 injections, spaced 2 months apart.  Please get me information  from Dr. Alvan Dame. I'd like to know why you are on chronic prednisone (vs just short tapers).  I recommend water aerobics, exercise bike or other exercise at least 150 minutes/week that may not bother your hip as much.  Consider walking smaller increments, but more frequently. Be sure to add in weight-bearing exercise at least 2x/week.  Your blood pressure was high today. Be sure to try and check it more regularly elsewhere, record it, and bring the list to your next visit (in 2 months). Follow a low sodium diet, get regular exercise as we discussed, and hopefully your blood pressure will come down to a normal range without needing medication.  If it is  consistently >135-140/85-90 then medication is necessary. Goal blood pressure is <130/80.   Low-Sodium Eating Plan Sodium, which is an element that makes up salt, helps you maintain a healthy balance of fluids in your body. Too much sodium can increase your blood pressure and cause fluid and waste to be held in your body. Your health care provider or dietitian may recommend following this plan if you have high blood pressure (hypertension), kidney disease, liver disease, or heart failure. Eating less sodium can help lower your blood pressure, reduce swelling, and protect your heart, liver, and kidneys. What are tips for following this plan? General guidelines  Most people on this plan should limit their sodium intake to 1,500-2,000 mg (milligrams) of sodium each day. Reading food labels  The Nutrition Facts label lists the amount of sodium in one serving of the food. If you eat more than one serving, you must multiply the listed amount of sodium by the number of servings.  Choose foods with less than 140 mg of sodium per serving.  Avoid foods with 300 mg of sodium or more per serving. Shopping  Look for lower-sodium products, often labeled as "low-sodium" or "no salt added."  Always check the sodium content even if foods are labeled as "unsalted" or "no salt added".  Buy fresh foods. ? Avoid canned foods and premade or frozen meals. ? Avoid canned, cured, or processed meats  Buy breads that have less than 80 mg of sodium per slice. Cooking  Eat more home-cooked food and less restaurant, buffet, and fast food.  Avoid adding salt when cooking. Use salt-free seasonings or herbs instead of table salt or sea salt. Check with your health care provider or pharmacist before using salt substitutes.  Cook with plant-based oils, such as canola, sunflower, or olive oil. Meal planning  When eating at a restaurant, ask that your food be prepared with less salt or no salt, if possible.  Avoid  foods that contain MSG (monosodium glutamate). MSG is sometimes added to [place] food, bouillon, and some canned foods. What foods are recommended? The items listed may not be a complete list. Talk with your dietitian about what dietary choices are best for you. Grains Low-sodium cereals, including oats, puffed wheat and rice, and shredded wheat. Low-sodium crackers. Unsalted rice. Unsalted pasta. Low-sodium bread. Whole-grain breads and whole-grain pasta. Vegetables Fresh or frozen vegetables. "No salt added" canned vegetables. "No salt added" tomato sauce and paste. Low-sodium or reduced-sodium tomato and vegetable juice. Fruits Fresh, frozen, or canned fruit. Fruit juice. Meats and other protein foods Fresh or frozen (no salt added) meat, poultry, seafood, and fish. Low-sodium canned tuna and salmon. Unsalted nuts. Dried peas, beans, and lentils without added salt. Unsalted canned beans. Eggs. Unsalted nut butters. Dairy Milk. Soy milk. Cheese that is naturally low in sodium,  such as ricotta cheese, fresh mozzarella, or Swiss cheese Low-sodium or reduced-sodium cheese. Cream cheese. Yogurt. Fats and oils Unsalted butter. Unsalted margarine with no trans fat. Vegetable oils such as canola or olive oils. Seasonings and other foods Fresh and dried herbs and spices. Salt-free seasonings. Low-sodium mustard and ketchup. Sodium-free salad dressing. Sodium-free light mayonnaise. Fresh or refrigerated horseradish. Lemon juice. Vinegar. Homemade, reduced-sodium, or low-sodium soups. Unsalted popcorn and pretzels. Low-salt or salt-free chips. What foods are not recommended? The items listed may not be a complete list. Talk with your dietitian about what dietary choices are best for you. Grains Instant hot cereals. Bread stuffing, pancake, and biscuit mixes. Croutons. Seasoned rice or pasta mixes. Noodle soup cups. Boxed or frozen macaroni and cheese. Regular salted crackers. Self-rising  flour. Vegetables Sauerkraut, pickled vegetables, and relishes. Olives. [place] fries. Onion rings. Regular canned vegetables (not low-sodium or reduced-sodium). Regular canned tomato sauce and paste (not low-sodium or reduced-sodium). Regular tomato and vegetable juice (not low-sodium or reduced-sodium). Frozen vegetables in sauces. Meats and other protein foods Meat or fish that is salted, canned, smoked, spiced, or pickled. Bacon, ham, sausage, hotdogs, corned beef, chipped beef, packaged lunch meats, salt pork, jerky, pickled herring, anchovies, regular canned tuna, sardines, salted nuts. Dairy Processed cheese and cheese spreads. Cheese curds. Blue cheese. Feta cheese. String cheese. Regular cottage cheese. Buttermilk. Canned milk. Fats and oils Salted butter. Regular margarine. Ghee. Bacon fat. Seasonings and other foods Onion salt, garlic salt, seasoned salt, table salt, and sea salt. Canned and packaged gravies. Worcestershire sauce. Tartar sauce. Barbecue sauce. Teriyaki sauce. Soy sauce, including reduced-sodium. Steak sauce. Fish sauce. Oyster sauce. Cocktail sauce. Horseradish that you find on the shelf. Regular ketchup and mustard. Meat flavorings and tenderizers. Bouillon cubes. Hot sauce and Tabasco sauce. Premade or packaged marinades. Premade or packaged taco seasonings. Relishes. Regular salad dressings. Salsa. Potato and tortilla chips. Corn chips and puffs. Salted popcorn and pretzels. Canned or dried soups. Pizza. Frozen entrees and pot pies. Summary  Eating less sodium can help lower your blood pressure, reduce swelling, and protect your heart, liver, and kidneys.  Most people on this plan should limit their sodium intake to 1,500-2,000 mg (milligrams) of sodium each day.  Canned, boxed, and frozen foods are high in sodium. Restaurant foods, fast foods, and pizza are also very high in sodium. You also get sodium by adding salt to food.  Try to cook at home, eat more fresh  fruits and vegetables, and eat less fast food, canned, processed, or prepared foods. This information is not intended to replace advice given to you by your health care provider. Make sure you discuss any questions you have with your health care provider. Document Released: 08/05/2001 Document Revised: 02/07/2016 Document Reviewed: 02/07/2016 Elsevier Interactive Patient Education  2017 [employer].

## 2017-01-12 LAB — CBC WITH DIFFERENTIAL/PLATELET
Basophils Absolute: 40 cells/uL (ref 0–200)
Basophils Relative: 0.6 %
Eosinophils Absolute: 147 cells/uL (ref 15–500)
Eosinophils Relative: 2.2 %
HCT: 43.2 % (ref 35.0–45.0)
Hemoglobin: 14.7 g/dL (ref 11.7–15.5)
Lymphs Abs: 1333 cells/uL (ref 850–3900)
MCH: 29.9 pg (ref 27.0–33.0)
MCHC: 34 g/dL (ref 32.0–36.0)
MCV: 87.8 fL (ref 80.0–100.0)
MPV: 10.4 fL (ref 7.5–12.5)
Monocytes Relative: 5.8 %
Neutro Abs: 4791 cells/uL (ref 1500–7800)
Neutrophils Relative %: 71.5 %
Platelets: 324 10*3/uL (ref 140–400)
RBC: 4.92 10*6/uL (ref 3.80–5.10)
RDW: 13.3 % (ref 11.0–15.0)
Total Lymphocyte: 19.9 %
WBC mixed population: 389 cells/uL (ref 200–950)
WBC: 6.7 10*3/uL (ref 3.8–10.8)

## 2017-01-12 LAB — LIPID PANEL
Cholesterol: 220 mg/dL — ABNORMAL HIGH (ref <200)
HDL: 104 mg/dL (ref >50)
LDL Cholesterol (Calc): 100 mg/dL (calc) — ABNORMAL HIGH
Non-HDL Cholesterol (Calc): 116 mg/dL (calc) (ref <130)
Total CHOL/HDL Ratio: 2.1 (calc) (ref <5.0)
Triglycerides: 72 mg/dL (ref <150)

## 2017-01-12 LAB — COMPREHENSIVE METABOLIC PANEL
AG Ratio: 1.5 (calc) (ref 1.0–2.5)
ALT: 14 U/L (ref 6–29)
AST: 10 U/L (ref 10–35)
Albumin: 4.3 g/dL (ref 3.6–5.1)
Alkaline phosphatase (APISO): 64 U/L (ref 33–130)
BUN: 14 mg/dL (ref 7–25)
CO2: 26 mmol/L (ref 20–32)
Calcium: 9.2 mg/dL (ref 8.6–10.4)
Chloride: 103 mmol/L (ref 98–110)
Creat: 0.87 mg/dL (ref 0.50–0.99)
Globulin: 2.8 g/dL (calc) (ref 1.9–3.7)
Glucose, Bld: 74 mg/dL (ref 65–99)
Potassium: 4.3 mmol/L (ref 3.5–5.3)
Sodium: 138 mmol/L (ref 135–146)
Total Bilirubin: 0.5 mg/dL (ref 0.2–1.2)
Total Protein: 7.1 g/dL (ref 6.1–8.1)

## 2017-01-12 LAB — FERRITIN: Ferritin: 81 ng/mL (ref 20–288)

## 2017-01-15 LAB — CYTOLOGY - PAP
DIAGNOSIS: NEGATIVE
HPV (WINDOPATH): NOT DETECTED

## 2017-02-11 ENCOUNTER — Other Ambulatory Visit: Payer: Self-pay | Admitting: Cardiology

## 2017-02-11 DIAGNOSIS — E7800 Pure hypercholesterolemia, unspecified: Secondary | ICD-10-CM

## 2017-02-11 DIAGNOSIS — Z01818 Encounter for other preprocedural examination: Secondary | ICD-10-CM

## 2017-02-11 DIAGNOSIS — Z8249 Family history of ischemic heart disease and other diseases of the circulatory system: Secondary | ICD-10-CM

## 2017-02-11 DIAGNOSIS — I10 Essential (primary) hypertension: Secondary | ICD-10-CM

## 2017-02-26 ENCOUNTER — Other Ambulatory Visit: Payer: Self-pay

## 2017-02-26 DIAGNOSIS — I10 Essential (primary) hypertension: Secondary | ICD-10-CM

## 2017-02-26 DIAGNOSIS — Z01818 Encounter for other preprocedural examination: Secondary | ICD-10-CM

## 2017-02-26 DIAGNOSIS — Z8249 Family history of ischemic heart disease and other diseases of the circulatory system: Secondary | ICD-10-CM

## 2017-02-26 DIAGNOSIS — E7800 Pure hypercholesterolemia, unspecified: Secondary | ICD-10-CM

## 2017-02-26 MED ORDER — ROSUVASTATIN CALCIUM 10 MG PO TABS: 10.0000 mg | ORAL_TABLET | Freq: Every day | ORAL | 0 refills | Status: DC

## 2017-03-12 ENCOUNTER — Encounter: Payer: Medicare Other | Admitting: Family Medicine

## 2017-05-30 ENCOUNTER — Other Ambulatory Visit: Payer: Self-pay | Admitting: Family Medicine

## 2017-05-31 NOTE — Telephone Encounter (Signed)
Is this okay to refill? 

## 2017-06-14 DIAGNOSIS — D0472 Carcinoma in situ of skin of left lower limb, including hip: Secondary | ICD-10-CM | POA: Diagnosis not present

## 2017-06-14 DIAGNOSIS — L570 Actinic keratosis: Secondary | ICD-10-CM | POA: Diagnosis not present

## 2017-06-22 ENCOUNTER — Telehealth: Payer: Self-pay | Admitting: Family Medicine

## 2017-06-22 NOTE — Telephone Encounter (Signed)
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

## 2017-06-25 NOTE — Telephone Encounter (Signed)
Called and informed pt and she is going to call the orthopedist to see if they think she needs to take antibiotic

## 2017-06-25 NOTE — Telephone Encounter (Signed)
Advise patient that I don't think it is necessary, but she probably should check with her orthopedist to be sure

## 2017-07-05 ENCOUNTER — Encounter: Payer: Self-pay | Admitting: *Deleted

## 2017-07-06 ENCOUNTER — Encounter: Payer: Self-pay | Admitting: Family Medicine

## 2017-07-10 DIAGNOSIS — C44729 Squamous cell carcinoma of skin of left lower limb, including hip: Secondary | ICD-10-CM | POA: Diagnosis not present

## 2017-07-10 HISTORY — PX: MOHS SURGERY: SHX181

## 2017-08-08 ENCOUNTER — Encounter: Payer: Self-pay | Admitting: Family Medicine

## 2017-09-21 ENCOUNTER — Other Ambulatory Visit: Payer: Self-pay | Admitting: Cardiology

## 2017-09-21 DIAGNOSIS — I10 Essential (primary) hypertension: Secondary | ICD-10-CM

## 2017-09-21 DIAGNOSIS — Z01818 Encounter for other preprocedural examination: Secondary | ICD-10-CM

## 2017-09-21 DIAGNOSIS — Z8249 Family history of ischemic heart disease and other diseases of the circulatory system: Secondary | ICD-10-CM

## 2017-09-21 DIAGNOSIS — E7800 Pure hypercholesterolemia, unspecified: Secondary | ICD-10-CM

## 2017-10-01 ENCOUNTER — Other Ambulatory Visit: Payer: Self-pay

## 2017-10-01 DIAGNOSIS — I10 Essential (primary) hypertension: Secondary | ICD-10-CM

## 2017-10-01 DIAGNOSIS — E7800 Pure hypercholesterolemia, unspecified: Secondary | ICD-10-CM

## 2017-10-01 DIAGNOSIS — Z8249 Family history of ischemic heart disease and other diseases of the circulatory system: Secondary | ICD-10-CM

## 2017-10-01 DIAGNOSIS — Z01818 Encounter for other preprocedural examination: Secondary | ICD-10-CM

## 2017-10-01 MED ORDER — ROSUVASTATIN CALCIUM 10 MG PO TABS: 10.0000 mg | ORAL_TABLET | Freq: Every day | ORAL | 0 refills | Status: DC

## 2017-10-26 ENCOUNTER — Other Ambulatory Visit: Payer: Self-pay | Admitting: Cardiology

## 2017-10-26 DIAGNOSIS — Z01818 Encounter for other preprocedural examination: Secondary | ICD-10-CM

## 2017-10-26 DIAGNOSIS — I10 Essential (primary) hypertension: Secondary | ICD-10-CM

## 2017-10-26 DIAGNOSIS — E7800 Pure hypercholesterolemia, unspecified: Secondary | ICD-10-CM

## 2017-10-26 DIAGNOSIS — Z8249 Family history of ischemic heart disease and other diseases of the circulatory system: Secondary | ICD-10-CM

## 2017-11-04 NOTE — Progress Notes (Signed)
Chief Complaint  Patient presents with  . Arthritis    patient is here today to discuss arthitis. Had R hip replacement 2016, also had steroid injection post surgery as well as oral and is still having pain. Brought in records today.      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 She complains of muscle pain around the right hip, as well as right knee pain.   We have no records from her orthopedist, but she continues to see them, last saw Dr. Alvan Dame in November, when she got a cortisone shot to her right lateral hip (?bursitis?), has also had injection into her right knee. The injection had helped her pain, but it is worse again.  She had one f/u appt that their office canceled. She is scheduled to see them 9/12 for recurrent pain in the right hip.  Right hip--she reports prior to surgery, she never had problems walking, just had pain getting in/out of car. Now is also having pain walking. Had pain getting onto a high barstool--had pain, couldn't lift leg up.  Stopped taking prednisone at her last visit here, in November. Not taking any NSAIDs.  Denies contraindications, h/o PUD.  PMH, PSH, SH reviewed  Outpatient Encounter Medications as of 11/05/2017  Medication Sig Note  . APPLE CIDER VINEGAR PO Take 2 capsules by mouth daily.   . cholecalciferol (VITAMIN D) 1000 units tablet Take 1,000 Units by mouth daily.   . cyanocobalamin 1000 MCG tablet Take 1,000 mcg by mouth daily.   . ferrous sulfate 325 (65 FE) MG tablet Take 1 tablet (325 mg total) by mouth 3 (three) times daily after meals. 01/06/2016: Takes it once daily  . Garlic 341 MG CAPS Take 1 capsule by mouth daily.   . Omega-3 Fatty Acids (FISH OIL) 1000 MG CAPS Take 1 capsule by mouth daily.   . rosuvastatin (CRESTOR) 10 MG tablet Take 1 tablet (10 mg total) by mouth daily.   . [DISCONTINUED] aspirin EC 81 MG tablet Take 81 mg by mouth daily.   . [DISCONTINUED] rosuvastatin (CRESTOR) 10 MG  tablet Take 1 tablet (10 mg total) by mouth daily.   . cetirizine (ZYRTEC) 10 MG tablet Take 10 mg by mouth daily as needed for allergies.  01/11/2017: Uses prn allergies  . meloxicam (MOBIC) 15 MG tablet Take 1 tablet (15 mg total) by mouth daily.   . valACYclovir (VALTREX) 1000 MG tablet TAKE 2 TABLETS AT ONSET OF COLD SORE, FOLLOWED BY A SECOND DOSE 12 HOURS LATER (4 TABS PER OUTBREAK) (Patient not taking: Reported on 11/05/2017)   . [DISCONTINUED] meloxicam (MOBIC) 15 MG tablet Take 15 mg by mouth daily. 05/25/2016: Uses very sporadically (leg pain).  . [DISCONTINUED] predniSONE (DELTASONE) 5 MG tablet Take 5 mg daily with breakfast by mouth. 01/11/2017: Prescribed by Dr. Alvan Dame, taking for a few months  . [DISCONTINUED] tiZANidine (ZANAFLEX) 4 MG tablet TAKE 1 TABLET BY MOUTH EVERY 6 HOURS AS NEEDED FOR SPASMS 05/25/2016: Uses prn, infrequent   No facility-administered encounter medications on file as of 11/05/2017.    (meloxicam rx'd today, not taking prior to visit)  Allergies  Allergen Reactions  . Morphine And Related     Patient prefers not to received"became addictive to this many years ago"- Demerol is okay    ROS: no fever, chills, URI symptoms, headaches, dizziness, abdominal or other GI complaints, bleeding, bruising, rash.  +hip, knee/leg pain as per HPI. No numbness, tingling, weakness.  Some  mild hip pain on left with certain movements as well.   PHYSICAL EXAM:  BP 120/80   Pulse 76   Ht 4' 11.5" (1.511 m)   Wt 185 lb 3.2 oz (84 kg)   BMI 36.78 kg/m    Well appearing, pleasant female, in no significant distress (just appearing somewhat frustrated due to ongoing pain, almost 3 years out from surgery) Extremities: no edema, normal pulses. Tender at right IT band, starting just below ASIS, extending all the way down the lateral leg to the lateral knee. She is also tender over right trochanteric bursa, nontender on left. Symmetric ROM of both hips, not causing  pain.  ASSESSMENT/PLAN:  Trochanteric bursitis of right hip - discussed rx NSAID to treat both bursitis and ITB syndrome; if bursitis persists, can treat with cortisone shot (here vs f/u with ortho) - Plan: meloxicam (MOBIC) 15 MG tablet  It band syndrome, right - treat with NSAID; shown stretches.  Risks/SE reviewed. Consider PT if not improving - Plan: meloxicam (MOBIC) 15 MG tablet  Pure hypercholesterolemia - pt requesting refills. labs UTD - Plan: rosuvastatin (CRESTOR) 10 MG tablet  Family history of early CAD - Plan: rosuvastatin (CRESTOR) 10 MG tablet  Pt to sign ROR to get records from Dr. Alvan Dame   Take meloxicam once daily with food.  If it bothers your stomach, cut it in half.  Take it once daily until your pain is better, for up to the full 15 days.  Do the stretches for the IT Band as shown. The anti-inflammatory should help with the bursitis and IT band.  If your bursitis doesn't resolve, you can return (here or ortho) for a cortisone injection.  Please consider physical therapy for these painful areas if they aren't responding to these measures.

## 2017-11-05 ENCOUNTER — Encounter: Payer: Self-pay | Admitting: Family Medicine

## 2017-11-05 ENCOUNTER — Ambulatory Visit (INDEPENDENT_AMBULATORY_CARE_PROVIDER_SITE_OTHER): Payer: Medicare Other | Admitting: Family Medicine

## 2017-11-05 VITALS — BP 120/80 | HR 76 | Ht 59.5 in | Wt 185.2 lb

## 2017-11-05 DIAGNOSIS — Z8249 Family history of ischemic heart disease and other diseases of the circulatory system: Secondary | ICD-10-CM | POA: Diagnosis not present

## 2017-11-05 DIAGNOSIS — E7800 Pure hypercholesterolemia, unspecified: Secondary | ICD-10-CM

## 2017-11-05 DIAGNOSIS — M7061 Trochanteric bursitis, right hip: Secondary | ICD-10-CM | POA: Diagnosis not present

## 2017-11-05 DIAGNOSIS — M7631 Iliotibial band syndrome, right leg: Secondary | ICD-10-CM | POA: Diagnosis not present

## 2017-11-05 MED ORDER — MELOXICAM 15 MG PO TABS: 15.0000 mg | ORAL_TABLET | Freq: Every day | ORAL | 0 refills | Status: DC

## 2017-11-05 MED ORDER — ROSUVASTATIN CALCIUM 10 MG PO TABS: 10.0000 mg | ORAL_TABLET | Freq: Every day | ORAL | 4 refills | Status: DC

## 2017-11-05 NOTE — Patient Instructions (Addendum)
Take meloxicam once daily with food.  If it bothers your stomach, cut it in half.  Take it once daily until your pain is better, for up to the full 15 days.  Do the stretches for the IT Band as shown. The anti-inflammatory should help with the bursitis and IT band.  If your bursitis doesn't resolve, you can return (here or ortho) for a cortisone injection.  Please consider physical therapy for these painful areas if they aren't responding to these measures.  Consider rescheduling your appointment with the orthopedist for 1-2 weeks, to see if this helps.  If you are still in pain, definitely see them.    Iliotibial Band Syndrome Iliotibial band syndrome (ITBS) is a condition that often causes knee pain. It can also cause pain in the outside of your hip, thigh, and knee. The iliotibial band is a strip of tissue that runs from the outside of your hip and down your thigh to the outside of your knee. Repeatedly bending and straightening your knee can irritate the iliotibial band. What are the causes? This condition is caused by inflammation and irritation from the friction of the iliotibial band moving over the thigh bone (femur) when you repeatedly bend and straighten your knee. What increases the risk? This condition is more likely to develop in people who:  Frequently change elevation during their workouts.  Run very long distances.  Recently increased the length or intensity of their workouts.  Run downhill often, or just started running downhill.  Ride a bike very far or often.  You may also be at greater risk if you start a new workout routine without first warming up or if you have a job that requires you to bend, squat, or climb frequently. What are the signs or symptoms? Symptoms of this condition include:  Pain along the outside of your knee that may be worse with activity, especially running or going up and down stairs.  A "snapping" sensation over your knee.  Swelling on  the outside of your knee.  Pain or a feeling of tightness in your hip.  How is this diagnosed? This condition is diagnosed based on your symptoms, medical history, and physical exam. You may also see a health care provider who specializes in reducing pain and increasing mobility (physical therapist). A physical therapist may do an exam to check your balance, movement, and way of walking or running (gait) to see whether the way you move could contribute to your injury. You may also have tests to measure your strength, flexibility, and range of motion. How is this treated? Treatment for this condition includes:  Resting and limiting exercise.  Returning to activities gradually.  Doing range-of-motion and strengthening exercises (physical therapy) as told by your health care provider.  Including low-impact activities, such as swimming, in your exercise routine.  Follow these instructions at home:  If directed, apply ice to the injured area. ? Put ice in a plastic bag. ? Place a towel between your skin and the bag. ? Leave the ice on for 20 minutes, 2-3 times per day.  Return to your normal activities as told by your health care provider. Ask your health care provider what activities are safe for you.  Keep all follow-up visits with your health care provider. This is important. Contact a health care provider if:  Your pain does not improve or gets worse despite treatment. This information is not intended to replace advice given to you by your health care provider. Make  sure you discuss any questions you have with your health care provider. Document Released: 08/05/2001 Document Revised: 03/17/2016 Document Reviewed: 03/17/2016 Elsevier Interactive Patient Education  2018 [place] in the blanks below--holding 15 seconds, 10 repetitions twice daily.  Iliotibial Band Syndrome Rehab Ask your health care provider which exercises are safe for you. Do exercises exactly as told  by your health care provider and adjust them as directed. It is normal to feel mild stretching, pulling, tightness, or discomfort as you do these exercises, but you should stop right away if you feel sudden pain or your pain gets worse.Do not begin these exercises until told by your health care provider. Stretching and range of motion exercises These exercises warm up your muscles and joints and improve the movement and flexibility of your hip and pelvis. Exercise A: Quadriceps, prone  1. Lie on your abdomen on a firm surface, such as a bed or padded floor. 2. Bend your left / right knee and hold your ankle. If you cannot reach your ankle or pant leg, loop a belt around your foot and grab the belt instead. 3. Gently pull your heel toward your buttocks. Your knee should not slide out to the side. You should feel a stretch in the front of your thigh and knee. 4. Hold this position for __________ seconds. Repeat __________ times. Complete this stretch __________ times a day. Exercise B: Iliotibial band  1. Lie on your side with your left / right leg in the top position. 2. Bend both of your knees and grab your left / right ankle. Stretch out your bottom arm to help you balance. 3. Slowly bring your top knee back so your thigh goes behind your trunk. 4. Slowly lower your top leg toward the floor until you feel a gentle stretch on the outside of your left / right hip and thigh. If you do not feel a stretch and your knee will not fall farther, place the heel of your other foot on top of your knee and pull your knee down toward the floor with your foot. 5. Hold this position for __________ seconds. Repeat __________ times. Complete this stretch __________ times a day. Strengthening exercises These exercises build strength and endurance in your hip and pelvis. Endurance is the ability to use your muscles for a long time, even after they get tired. Exercise C: Straight leg raises ( hip  abductors) 1. Lie on your side with your left / right leg in the top position. Lie so your head, shoulder, knee, and hip line up. You may bend your bottom knee to help you balance. 2. Roll your hips slightly forward so your hips are stacked directly over each other and your left / right knee is facing forward. 3. Tense the muscles in your outer thigh and lift your top leg 4-6 inches (10-15 cm). 4. Hold this position for __________ seconds. 5. Slowly return to the starting position. Let your muscles relax completely before doing another repetition. Repeat __________ times. Complete this exercise __________ times a day. Exercise D: Straight leg raises ( hip extensors) 1. Lie on your abdomen on your bed or a firm surface. You can put a pillow under your hips if that is more comfortable. 2. Bend your left / right knee so your foot is straight up in the air. 3. Squeeze your buttock muscles and lift your left / right thigh off the bed. Do not let your back arch. 4. Tense this muscle as hard  as you can without increasing any knee pain. 5. Hold this position for __________ seconds. 6. Slowly lower your leg to the starting position and allow it to relax completely. Repeat __________ times. Complete this exercise __________ times a day. Exercise E: Hip hike 1. Stand sideways on a bottom step. Stand on your left / right leg with your other foot unsupported next to the step. You can hold onto the railing or wall if needed for balance. 2. Keep your knees straight and your torso square. Then, lift your left / right hip up toward the ceiling. 3. Slowly let your left / right hip lower toward the floor, past the starting position. Your foot should get closer to the floor. Do not lean or bend your knees. Repeat __________ times. Complete this exercise __________ times a day. This information is not intended to replace advice given to you by your health care provider. Make sure you discuss any questions you have  with your health care provider. Document Released: 02/13/2005 Document Revised: 10/19/2015 Document Reviewed: 01/15/2015 Elsevier Interactive Patient Education  2018 [place].   Trochanteric Bursitis Trochanteric bursitis is a condition that causes hip pain. Trochanteric bursitis happens when fluid-filled sacs (bursae) in the hip get irritated. Normally these sacs absorb shock and help strong bands of tissue (tendons) in your hip glide smoothly over each other and over your hip bones. What are the causes? This condition results from increased friction between the hip bones and the tendons that go over them. This condition can happen if you:  Have weak hips.  Use your hip muscles too much (overuse).  Get hit in the hip.  What increases the risk? This condition is more likely to develop in:  Women.  Adults who are middle-aged or older.  People with arthritis or a spinal condition.  People with weak buttocks muscles (gluteal muscles).  People who have one leg that is shorter than the other.  People who participate in certain kinds of athletic activities, such as: ? Running sports, especially long-distance running. ? Contact sports, like football or martial arts. ? Sports in which falls may occur, like skiing.  What are the signs or symptoms? The main symptom of this condition is pain and tenderness over the point of your hip. The pain may be:  Sharp and intense.  Dull and achy.  Felt on the outside of your thigh.  It may increase when you:  Lie on your side.  Walk or run.  Go up on stairs.  Sit.  Stand up after sitting.  Stand for long periods of time.  How is this diagnosed? This condition may be diagnosed based on:  Your symptoms.  Your medical history.  A physical exam.  Imaging tests, such as: ? X-rays to check your bones. ? An MRI or ultrasound to check your tendons and muscles.  During your physical exam, your health care provider will  check the movement and strength of your hip. He or she may press on the point of your hip to check for pain. How is this treated? This condition may be treated by:  Resting.  Reducing your activity.  Avoiding activities that cause pain.  Using crutches, a cane, or a walker to decrease the strain on your hip.  Taking medicine to help with swelling.  Having medicine injected into the bursae to help with swelling.  Using ice, heat, and massage therapy for pain relief.  Physical therapy exercises for strength and flexibility.  Surgery (rare).  Follow these instructions at home: Activity  Rest.  Avoid activities that cause pain.  Return to your normal activities as told by your health care provider. Ask your health care provider what activities are safe for you. Managing pain, stiffness, and swelling  Take over-the-counter and prescription medicines only as told by your health care provider.  If directed, apply heat to the injured area as told by your health care provider. ? Place a towel between your skin and the heat source. ? Leave the heat on for 20-30 minutes. ? Remove the heat if your skin turns bright red. This is especially important if you are unable to feel pain, heat, or cold. You may have a greater risk of getting burned.  If directed, apply ice to the injured area: ? Put ice in a plastic bag. ? Place a towel between your skin and the bag. ? Leave the ice on for 20 minutes, 2-3 times a day. General instructions  If the affected leg is one that you use for driving, ask your health care provider when it is safe to drive.  Use crutches, a cane, or a walker as told by your health care provider.  If one of your legs is shorter than the other, get fitted for a shoe insert.  Lose weight if you are overweight. How is this prevented?  Wear supportive footwear that is appropriate for your sport.  If you have hip pain, start any new exercise or sport  slowly.  Maintain physical fitness, including: ? Strength. ? Flexibility. Contact a health care provider if:  Your pain does not improve with 2-4 weeks. Get help right away if:  You develop severe pain.  You have a fever.  You develop increased redness over your hip.  You have a change in your bowel function or bladder function.  You cannot control the muscles in your feet. This information is not intended to replace advice given to you by your health care provider. Make sure you discuss any questions you have with your health care provider. Document Released: 03/23/2004 Document Revised: 10/20/2015 Document Reviewed: 01/29/2015 Elsevier Interactive Patient Education  [employer].

## 2017-11-15 DIAGNOSIS — C44629 Squamous cell carcinoma of skin of left upper limb, including shoulder: Secondary | ICD-10-CM | POA: Diagnosis not present

## 2017-11-15 DIAGNOSIS — L570 Actinic keratosis: Secondary | ICD-10-CM | POA: Diagnosis not present

## 2017-11-22 DIAGNOSIS — C44629 Squamous cell carcinoma of skin of left upper limb, including shoulder: Secondary | ICD-10-CM | POA: Diagnosis not present

## 2017-11-27 ENCOUNTER — Other Ambulatory Visit: Payer: Self-pay | Admitting: Family Medicine

## 2017-11-27 DIAGNOSIS — M7061 Trochanteric bursitis, right hip: Secondary | ICD-10-CM

## 2017-11-27 DIAGNOSIS — M7631 Iliotibial band syndrome, right leg: Secondary | ICD-10-CM

## 2017-11-27 NOTE — Telephone Encounter (Signed)
Patient requesting this refill, tried for 2 weeks and made a huge difference.

## 2017-11-27 NOTE — Telephone Encounter (Signed)
Patient advised.

## 2017-11-27 NOTE — Telephone Encounter (Signed)
This was not supposed to be for ongoing use. She was supposed to be doing the stretches, and/or following up with ortho and seeing PT. If she finished the 2 weeks, and it started to recur, okay to refill for another 15, but this is not a medication for her to taking daily long-term. It is supposed to help treat/fix the problem, but she may need to be getting PT as well (she had bursitis and IT band syndrome)

## 2017-12-10 ENCOUNTER — Encounter: Payer: Self-pay | Admitting: Family Medicine

## 2017-12-10 ENCOUNTER — Ambulatory Visit (INDEPENDENT_AMBULATORY_CARE_PROVIDER_SITE_OTHER): Payer: Medicare Other | Admitting: Family Medicine

## 2017-12-10 ENCOUNTER — Telehealth: Payer: Self-pay | Admitting: Family Medicine

## 2017-12-10 VITALS — BP 120/70 | HR 82 | Temp 98.1°F | Ht 59.5 in | Wt 186.2 lb

## 2017-12-10 DIAGNOSIS — L259 Unspecified contact dermatitis, unspecified cause: Secondary | ICD-10-CM | POA: Diagnosis not present

## 2017-12-10 DIAGNOSIS — Z23 Encounter for immunization: Secondary | ICD-10-CM | POA: Diagnosis not present

## 2017-12-10 DIAGNOSIS — H00011 Hordeolum externum right upper eyelid: Secondary | ICD-10-CM | POA: Diagnosis not present

## 2017-12-10 NOTE — Telephone Encounter (Signed)
Received requested records from [facility]. Sending back for review.

## 2017-12-10 NOTE — Progress Notes (Signed)
Chief Complaint  Patient presents with  . Eye Pain    right eye x4-5 days    4 days she started with slight itching on her right eyelid, felt dry. She applied vaseline to the lid.  She originally thought she could have irritation from a new eye shadow.  She changed back to regular make up.   She is noticing a bump coming up on the right upper eyelid. The left eyelid is now also starting to itch and get a little red.  No sneezing, itchy/watery eyes (just itching on the lids). Denies any eye drainage or crusting. No vision change or eye pain.  She has used warm and cold compresses, as well as a topical antibiotic ointment, which hasn't helped.  PMH, PSH, SH reviewed  Outpatient Encounter Medications as of 12/10/2017  Medication Sig Note  . APPLE CIDER VINEGAR PO Take 2 capsules by mouth daily.   . cetirizine (ZYRTEC) 10 MG tablet Take 10 mg by mouth daily as needed for allergies.  01/11/2017: Uses prn allergies  . cholecalciferol (VITAMIN D) 1000 units tablet Take 1,000 Units by mouth daily.   . cyanocobalamin 1000 MCG tablet Take 1,000 mcg by mouth daily.   . ferrous sulfate 325 (65 FE) MG tablet Take 1 tablet (325 mg total) by mouth 3 (three) times daily after meals. 01/06/2016: Takes it once daily  . Garlic 657 MG CAPS Take 1 capsule by mouth daily.   . meloxicam (MOBIC) 15 MG tablet TAKE 1 TABLET BY MOUTH EVERY DAY   . Omega-3 Fatty Acids (FISH OIL) 1000 MG CAPS Take 1 capsule by mouth daily.   . rosuvastatin (CRESTOR) 10 MG tablet Take 1 tablet (10 mg total) by mouth daily.   . valACYclovir (VALTREX) 1000 MG tablet TAKE 2 TABLETS AT ONSET OF COLD SORE, FOLLOWED BY A SECOND DOSE 12 HOURS LATER (4 TABS PER OUTBREAK)    No facility-administered encounter medications on file as of 12/10/2017.    Allergies  Allergen Reactions  . Morphine And Related     Patient prefers not to received"became addictive to this many years ago"- Demerol is okay    ROS no fever, chills, URI or allergy  symptoms.  No vision changes, eye drainage.  No headaches, chest pain, other rashes or concerns  PHYSICAL EXAM:  BP 120/70   Pulse 82   Temp 98.1 F (36.7 C) (Oral)   Ht 4' 11.5" (1.511 m)   Wt 186 lb 3.2 oz (84.5 kg)   SpO2 96%   BMI 36.98 kg/m   Well appearing, pleasant female, in no distress HEENT: conjunctiva and sclera are clear--anicteric, noninjected. EOMI. There is mild erythema of the right upper eyelid, no warmth or soft tissue swelling.  Lid margin where eyelashes are appear normal, no flaking or erythema in this area. There is a very small subcutaneous swelling that is slightly tender. No pustule. Nasal mucosa is normal. OP is clear. Sinuses are nontender Neck: no lymphadenopathy or mass   ASSESSMENT/PLAN:  Contact dermatitis, unspecified contact dermatitis type, unspecified trigger - possbily related to makeup. has already d/c'd new makeup. Rec OTC HC BID-TID prn. Also can wash with baby shampoo  Hordeolum externum of right upper eyelid - possibly very early formation. Continue use of warm compresses   Try using hydrocortisone cream (1%) 2-3 times daily to the itchy eyelids. You can also try rinsing your eyelids/lashes with baby shampoo. Continue the warm compresses. If the lid becomes much more tender, swollen, red, then it  may need antibiotics and/or drainage  You may have a contact dermatitis (from makeup) vs also having an early stye. I see no evidence of abscess or conjunctivitis (pink-eye). If you start having eye pain or vision changes, please go see your eye doctor.

## 2017-12-10 NOTE — Patient Instructions (Signed)
  Try using hydrocortisone cream (1%) 2-3 times daily to the itchy eyelids. You can also try rinsing your eyelids/lashes with baby shampoo. Continue the warm compresses. If the lid becomes much more tender, swollen, red, then it may need antibiotics and/or drainage  You may have a contact dermatitis (from makeup) vs also having an early stye. I see no evidence of abscess or conjunctivitis (pink-eye). If you start having eye pain or vision changes, please go see your eye doctor.

## 2017-12-18 ENCOUNTER — Telehealth: Payer: Self-pay | Admitting: Family Medicine

## 2017-12-18 NOTE — Telephone Encounter (Signed)
Pt called wanting to know if she has ever had bloodwork to test for cancer, specifically colon cancer or cervical cancer. If so she wants to know the date of the bloodwork. Also pt describes a test similar to a cologuard and thinks our office requested it around November 2018. Pt requesting the date this test was done as well.

## 2017-12-18 NOTE — Telephone Encounter (Signed)
Advise pt  She had colonoscopy in 07/2016 and pap smear 12/2016.  These are the screening tests for colon cancer and cervical cancer (not blood tests).  We would not have done Cologuard if she had just had colonoscopy, I think she is mistaken.    We discuss all health maintenance issues at her physical--she was due for one 12/2017, looks like she is scheduled or 03/2018.  She had routine blood work done with her last physical (12/2016).

## 2018-01-02 NOTE — Telephone Encounter (Signed)
Pt called me back and I gave her Dr. Johnsie Kindred info. Pt understood

## 2018-01-27 ENCOUNTER — Other Ambulatory Visit: Payer: Self-pay | Admitting: Family Medicine

## 2018-01-27 DIAGNOSIS — Z8249 Family history of ischemic heart disease and other diseases of the circulatory system: Secondary | ICD-10-CM

## 2018-01-27 DIAGNOSIS — E7800 Pure hypercholesterolemia, unspecified: Secondary | ICD-10-CM

## 2018-03-28 ENCOUNTER — Other Ambulatory Visit: Payer: Self-pay | Admitting: Family Medicine

## 2018-03-28 NOTE — Telephone Encounter (Signed)
Is this okay to refill? 

## 2018-04-04 ENCOUNTER — Ambulatory Visit: Payer: Self-pay | Admitting: Family Medicine

## 2018-05-29 ENCOUNTER — Other Ambulatory Visit: Payer: Self-pay | Admitting: *Deleted

## 2018-05-29 DIAGNOSIS — Z5181 Encounter for therapeutic drug level monitoring: Secondary | ICD-10-CM

## 2018-05-29 DIAGNOSIS — E7800 Pure hypercholesterolemia, unspecified: Secondary | ICD-10-CM

## 2018-05-30 ENCOUNTER — Other Ambulatory Visit: Payer: Self-pay

## 2018-05-30 ENCOUNTER — Other Ambulatory Visit: Payer: Medicare Other

## 2018-05-30 DIAGNOSIS — E7800 Pure hypercholesterolemia, unspecified: Secondary | ICD-10-CM

## 2018-05-30 DIAGNOSIS — Z5181 Encounter for therapeutic drug level monitoring: Secondary | ICD-10-CM | POA: Diagnosis not present

## 2018-05-31 LAB — CBC WITH DIFFERENTIAL/PLATELET
Basophils Absolute: 0 10*3/uL (ref 0.0–0.2)
Basos: 1 %
EOS (ABSOLUTE): 0.1 10*3/uL (ref 0.0–0.4)
Eos: 2 %
Hematocrit: 42.1 % (ref 34.0–46.6)
Hemoglobin: 14.5 g/dL (ref 11.1–15.9)
Immature Grans (Abs): 0 10*3/uL (ref 0.0–0.1)
Immature Granulocytes: 0 %
Lymphocytes Absolute: 1.2 10*3/uL (ref 0.7–3.1)
Lymphs: 27 %
MCH: 30.1 pg (ref 26.6–33.0)
MCHC: 34.4 g/dL (ref 31.5–35.7)
MCV: 88 fL (ref 79–97)
Monocytes Absolute: 0.5 10*3/uL (ref 0.1–0.9)
Monocytes: 10 %
Neutrophils Absolute: 2.8 10*3/uL (ref 1.4–7.0)
Neutrophils: 60 %
Platelets: 303 10*3/uL (ref 150–450)
RBC: 4.81 x10E6/uL (ref 3.77–5.28)
RDW: 13 % (ref 11.7–15.4)
WBC: 4.6 10*3/uL (ref 3.4–10.8)

## 2018-05-31 LAB — LIPID PANEL
Chol/HDL Ratio: 3 ratio (ref 0.0–4.4)
Cholesterol, Total: 223 mg/dL — ABNORMAL HIGH (ref 100–199)
HDL: 75 mg/dL (ref >39)
LDL Calculated: 127 mg/dL — ABNORMAL HIGH (ref 0–99)
Triglycerides: 107 mg/dL (ref 0–149)
VLDL Cholesterol Cal: 21 mg/dL (ref 5–40)

## 2018-05-31 LAB — COMPREHENSIVE METABOLIC PANEL
ALT: 21 IU/L (ref 0–32)
AST: 13 IU/L (ref 0–40)
Albumin/Globulin Ratio: 1.7 (ref 1.2–2.2)
Albumin: 4.5 g/dL (ref 3.8–4.8)
Alkaline Phosphatase: 68 IU/L (ref 39–117)
BUN/Creatinine Ratio: 17 (ref 12–28)
BUN: 13 mg/dL (ref 8–27)
Bilirubin Total: 0.4 mg/dL (ref 0.0–1.2)
CO2: 23 mmol/L (ref 20–29)
Calcium: 9.8 mg/dL (ref 8.7–10.3)
Chloride: 100 mmol/L (ref 96–106)
Creatinine, Ser: 0.77 mg/dL (ref 0.57–1.00)
GFR calc Af Amer: 92 mL/min/{1.73_m2} (ref >59)
GFR calc non Af Amer: 80 mL/min/{1.73_m2} (ref >59)
Globulin, Total: 2.7 g/dL (ref 1.5–4.5)
Glucose: 107 mg/dL — ABNORMAL HIGH (ref 65–99)
Potassium: 5 mmol/L (ref 3.5–5.2)
Sodium: 142 mmol/L (ref 134–144)
Total Protein: 7.2 g/dL (ref 6.0–8.5)

## 2018-06-01 NOTE — Patient Instructions (Addendum)
HEALTH MAINTENANCE RECOMMENDATIONS:  It is recommended that you get at least 30 minutes of aerobic exercise at least 5 days/week (for weight loss, you may need as much as 60-90 minutes). This can be any activity that gets your heart rate up. This can be divided in 10-15 minute intervals if needed, but try and build up your endurance at least once a week.  Weight bearing exercise is also recommended twice weekly.  Eat a healthy diet with lots of vegetables, fruits and fiber.  "Colorful" foods have a lot of vitamins (ie green vegetables, tomatoes, red peppers, etc).  Limit sweet tea, regular sodas and alcoholic beverages, all of which has a lot of calories and sugar.  Up to 1 alcoholic drink daily may be beneficial for women (unless trying to lose weight, watch sugars).  Drink a lot of water.  Calcium recommendations are 1200-1500 mg daily (1500 mg for postmenopausal women or women without ovaries), and vitamin D 1000 IU daily.  This should be obtained from diet and/or supplements (vitamins), and calcium should not be taken all at once, but in divided doses.  Monthly self breast exams and yearly mammograms for women over the age of 44 is recommended.  Sunscreen of at least SPF 30 should be used on all sun-exposed parts of the skin when outside between the hours of 10 am and 4 pm (not just when at beach or pool, but even with exercise, golf, tennis, and yard work!)  Use a sunscreen that says "broad spectrum" so it covers both UVA and UVB rays, and make sure to reapply every 1-2 hours.  Remember to change the batteries in your smoke detectors when changing your clock times in the spring and fall. Carbon monoxide detectors are recommended for you home.  Use your seat belt every time you are in a car, and please drive safely and not be distracted with cell phones and texting while driving.   Lauren [name] , Thank you for taking time to come for your Medicare Wellness Visit. I appreciate your ongoing  commitment to your health goals. Please review the following plan we discussed and let me know if I can assist you in the future.   This is a list of the screening recommended for you and due dates:  Health Maintenance  Topic Date Due  . Flu Shot  09/28/2018  . Tetanus Vaccine  10/06/2018  . Mammogram  11/30/2017  . Colon Cancer Screening  07/29/2023  . DEXA scan (bone density measurement)  Completed  .  Hepatitis C: One time screening is recommended by Center for Disease Control  (CDC) for  adults born from [birth year] through 1966.   Completed  . Pneumonia vaccines  Completed   Continue yearly flu shots in the Fall (ignore the date above) You are due for another tetanus booster this year. This is covered by Medicare part D, which is the pharmacy benefit, so you need to get the TdaP from your pharmacy (like you did the shingrix).  Yearly mammograms are recommended. I don't believe you had one in 2019, so be sure to schedule one. Your bone density test showed thinning of the bones, so it is time to get it repeated. Contact [facility] to schedule this test (you can try and arrange to have the mammogram done the same day).  They can fax me the order to sign.  Put your cholesterol medication in your bathroom--take it every night when you brush your teeth.  When you have a cold  sore start (as soon as you can tell one is coming), take TWO of the 1000mg  valacyclovir together.  Repeat this dose (2 more pills) 12 hours later.  And that's it for the whole treatment (4 tablets per course).  It doesn't sound as though you are getting adequate calcium in your diet.  Please consider getting more from your diet and/or a supplement.

## 2018-06-01 NOTE — Progress Notes (Signed)
Start time: 8:49 End time: 9:22  Virtual Visit via Video Note  I connected with Lauren [name] on 06/03/2018 by a video enabled telemedicine application (Zoom) and verified that I am speaking with the correct person using two identifiers. The patient is at her office, alone. MD is at the office.  I discussed the limitations of evaluation and management by telemedicine and the availability of in person appointments. The patient expressed understanding and agreed to proceed.  History of Present Illness:  Chief Complaint  Patient presents with  . Medicare Wellness    virtual AWV visit. No concerns.    Lauren [name] is a 68 y.o. female who presents for virtual Medicare annual wellness visit and follow-up on chronic medical conditions.  She had labs done prior to her visit, see below.  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  Overall, has been doing quite well.   Hyperlipidemia/family history of CAD: Shehas Dr. Nelson/cardiology in the past, as she was sent for cardiac clearance prior to hip replacement. She had a normal stress test, and was started on Crestor. She tolerates statin without side effects (as long as she takes it at night, had some dizziness when she wasn't).  She admits to still taking it only 4-5 times/week (is tired when she gets home and forgets sometime). She eats very few eggs, infrequentice cream, doesn't eat cheese. She no longereats red meatas often, maybe just twice a month.  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  Has a few pills left.  Osteopenia:  T-1.6 at left femoral neck in 10/2014.  She is due for recheck of bone density.   She continues to take vitamin D daily. Not getting much calcium in her diet, nor weight-bearing exercise.  Immunization History  Administered Date(s)  Administered  . Influenza Split 11/24/2010  . Influenza, High Dose Seasonal PF 10/29/2014, 12/23/2015, 01/11/2017, 12/10/2017  . Influenza,inj,Quad PF,6+ Mos 10/27/2013  . Pneumococcal Conjugate-13 10/29/2014  . Pneumococcal Polysaccharide-23 10/05/2008, 01/06/2016  . Td 02/28/2000  . Tdap 10/05/2008  . Zoster 11/24/2010  . Zoster Recombinat (Shingrix) 10/04/2017, 12/17/2017   Last Pap smear: 12/2016, no high risk HPV detected Last mammogram:11/2016 Last colonoscopy: 07/2016 (scattered diverticula, Dr. Collene Mares) Last DEXA:10/2014 ([facility])--T-1.6 Dentist:once yearly, last 6 months ago. Ophtho: about 1.5 years ago Exercise: walks a lot (on the lot, going to auctions, chasing great-grandkids) and yardwork.  Not aerobic.  No regular weight-bearing exercise.  Vitamin D level was normal in 2012  Other doctors caring for patient include: Dentist: Dr. Derrill Center (Dr. Randol Kern for bridges/crowns) Dermatologist: Dr. Rolm Bookbinder (goes yearly) Cardiologist: Dr.Nelson GI: Dr. Collene Mares Ortho: Dr. Alvan Dame Ophtho: Dr. Gershon Crane  Depressionscreen: negative Fall screen: negative Functional Status Survey: Notable for slight trouble hearing (certain tones, or certain people who mumble), leakage of urine (if picks up something heavy) Mini-Cog screen: normal (showed me clock picture over video).  End of Life Discussion: Patient hasa living will and medical power of attorney  Past Medical History:  Diagnosis Date  . Arthritis    hips-Right worse and right knee.  . Facial basal cell cancer    nose (Dr. Ubaldo Glassing), leg  . Herpes simplex labialis   . Hyperlipidemia   . Melanoma ([place])    right shoulder- 10 yrs ago- no further issues  . SCC (squamous cell  carcinoma) 11/16/2016   left leg  . Squamous cell skin cancer    many sites (hands/legs)    Past Surgical History:  Procedure Laterality Date  . APPENDECTOMY    . CARDIOVASCULAR STRESS TEST  06-2005   Dr. Rex Kras  . CESAREAN SECTION  x2  .  MOHS SURGERY Left 07/10/2017   LLE for SCC, Dr. Sarajane Jews  . REFRACTIVE SURGERY  age 89  . REFRACTIVE SURGERY    . TOTAL HIP ARTHROPLASTY Right 01/19/2015   Procedure: TOTAL RIGHT HIP ARTHROPLASTY ANTERIOR APPROACH;  Surgeon: Paralee Cancel, MD;  Location: [facility];  Service: Orthopedics;  Laterality: Right;    Social History   Socioeconomic History  . Marital status: Divorced    Spouse name: Not on file  . Number of children: 2  . Years of education: Not on file  . Highest education level: Not on file  Occupational History  . 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 Financial resource strain: Not on file  . Food insecurity:    Worry: Not on file    Inability: Not on file  . Transportation needs:    Medical: Not on file    Non-medical: Not on file  Tobacco Use  . Smoking status: Never Smoker  . Smokeless tobacco: Never Used  Substance and Sexual Activity  . Alcohol use: Yes    Comment: 1 drink every 6 months.  . Drug use: No  . Sexual activity: Not Currently    Partners: Male  Lifestyle  . Physical activity:    Days per week: Not on file    Minutes per session: Not on file  . Stress: Not on file  Relationships  . Social connections:    Talks on phone: Not on file    Gets together: Not on file    Attends religious service: Not on file    Active member of club or organization: Not on file    Attends meetings of clubs or organizations: Not on file    Relationship status: Not on file  . Intimate partner violence:    Fear of current or ex partner: Not on file    Emotionally abused: Not on file    Physically abused: Not on file    Forced sexual activity: Not on file  Other Topics Concern  . Not on file  Social History Narrative   Lives alone. Both sons live in [place]. 2 grandchildren, and 2 great-grandchildren who she watches them 2 days/week and on weekends. She is "semi-retired".    Family History  Problem Relation Age of  Onset  . Heart disease Mother 48       MI  . COPD Mother   . Hyperlipidemia Mother   . Hypertension Mother   . Heart disease Sister   . Heart disease Sister   . Hyperlipidemia Sister   . Hypertension Sister   . Diabetes Sister   . Graves' disease Sister   . COPD Sister   . Heart disease Brother        103's  . Heart disease Brother   . COPD Brother   . Alcohol abuse Brother   . Diabetes Brother        borderline  . COPD Brother   . Alcohol abuse Father   . Stroke Father 72  . Diabetes Sister   . Heart disease Sister   . Heart disease Sister  CAD, s/p angioplasties  . Diabetes Sister   . Hyperlipidemia Sister   . Hypertension Sister   . Gout Sister   . Arthritis Sister     Outpatient Encounter Medications as of 06/03/2018  Medication Sig Note  . APPLE CIDER VINEGAR PO Take 2 capsules by mouth daily.   . cholecalciferol (VITAMIN D) 1000 units tablet Take 1,000 Units by mouth daily.   . cyanocobalamin 1000 MCG tablet Take 1,000 mcg by mouth daily.   . ferrous sulfate 325 (65 FE) MG tablet Take 1 tablet (325 mg total) by mouth 3 (three) times daily after meals. 01/06/2016: Takes it once daily  . Garlic 017 MG CAPS Take 1 capsule by mouth daily.   . Omega-3 Fatty Acids (FISH OIL) 1000 MG CAPS Take 1 capsule by mouth daily.   . rosuvastatin (CRESTOR) 10 MG tablet Take 1 tablet (10 mg total) by mouth daily.   . [DISCONTINUED] rosuvastatin (CRESTOR) 10 MG tablet TAKE 1 TABLET BY MOUTH EVERY DAY 06/03/2018: Admits to missing some, takes it 4-5 times/week  . cetirizine (ZYRTEC) 10 MG tablet Take 10 mg by mouth daily as needed for allergies.  01/11/2017: Uses prn allergies  . valACYclovir (VALTREX) 1000 MG tablet TAKE 2 TABLETS AT ONSET OF COLD SORE, FOLLOWED BY A SECOND DOSE 12 HOURS LATER (4 TABS PER OUTBREAK) (Patient not taking: Reported on 06/03/2018)   . [DISCONTINUED] meloxicam (MOBIC) 15 MG tablet TAKE 1 TABLET BY MOUTH EVERY DAY (Patient not taking: Reported on 06/03/2018)     No facility-administered encounter medications on file as of 06/03/2018.     Allergies  Allergen Reactions  . Morphine And Related     Patient prefers not to received"became addictive to this many years ago"- Demerol is okay    ROS: The patient denies anorexia, fever, headaches, vision changes, decreased hearing, ear pain, sore throat, breast concerns, chest pain, palpitations, dizziness, syncope, dyspnea on exertion, cough, swelling, nausea, vomiting, diarrhea, constipation, abdominal pain, melena, hematochezia, indigestion/heartburn, hematuria, dysuria, vaginal bleeding, discharge, odor or itch, genital lesions, numbness, tingling, weakness, tremor, suspicious skin lesions (sees derm yearly), depression, anxiety, abnormal bleeding/bruising, or enlarged lymph nodes.  Some leakage of urine with sneeze/cough, lifting heavy object, only with full bladder. Insomnia is better (last year had more issues, trouble waking up after 3 hours, sometimes can't get back to sleep.)  Only rarely now, when she goes to bed "worrying" She is s/p hip replacement. She reports having residual right hip pain. Considering a second opinion. Meloxicam and cortisone shots have helped temporarily. Pain is at the side of her hip, not the groin.She has gotten injections in the past for bursitis.   Observations/Objective:  BP 138/78   Pulse 80   Ht 4' 11.75" (1.518 m)   Wt 190 lb 9.6 oz (86.5 kg)   BMI 37.54 kg/m  (vitals performed on the day she came for labs, 05/30/2018)  Wt Readings from Last 3 Encounters:  06/03/18 190 lb 9.6 oz (86.5 kg)  12/10/17 186 lb 3.2 oz (84.5 kg)  11/05/17 185 lb 3.2 oz (84 kg)   Exam is limited due to virtual nature of the visit. On the video, she appears well-dressed, alert, and in good spirits.  She is oriented, with normal eye contact, speech, memory. Cranial nerves are grossly intact. Normal mood, affect, grooming     Chemistry      Component Value Date/Time   NA 142  05/30/2018 0830   K 5.0 05/30/2018 0830   CL 100  05/30/2018 0830   CO2 23 05/30/2018 0830   BUN 13 05/30/2018 0830   CREATININE 0.77 05/30/2018 0830   CREATININE 0.87 01/11/2017 1003      Component Value Date/Time   CALCIUM 9.8 05/30/2018 0830   ALKPHOS 68 05/30/2018 0830   AST 13 05/30/2018 0830   ALT 21 05/30/2018 0830   BILITOT 0.4 05/30/2018 0830     Fasting glucose 107  Lab Results  Component Value Date   CHOL 223 (H) 05/30/2018   HDL 75 05/30/2018   LDLCALC 127 (H) 05/30/2018   TRIG 107 05/30/2018   CHOLHDL 3.0 05/30/2018   Lab Results  Component Value Date   WBC 4.6 05/30/2018   HGB 14.5 05/30/2018   HCT 42.1 05/30/2018   MCV 88 05/30/2018   PLT 303 05/30/2018     Assessment and Plan:  Medicare annual wellness visit, subsequent  Family history of early CAD - Plan: rosuvastatin (CRESTOR) 10 MG tablet  Herpes labialis - dosing of valtrex prn was incorrect, educated on proper dosing (2gram q 12 hour x 2 doses only)  Osteopenia, unspecified location - Due for DEXA, to schedule at [facility]. Discussed Ca and weight-bearing exercising (not getting enough of either). cont vitamin D daily  Pure hypercholesterolemia - controlled; discussed ways to be better compliant with crestor dosing. Cont low chol diet. - Plan: rosuvastatin (CRESTOR) 10 MG tablet  Impaired fasting glucose - fasting sugar 107; discussed daily exercise, low carb diet and weight loss to help keep sugar down   Discussed monthly self breast exams and yearly mammograms (past due and reminded to schedule); at least 30 minutes of aerobic activity at least 5 days/week, weight-bearing exercise 2x/week; proper sunscreen use reviewed; healthy diet, including goals of calcium and vitamin D intake and alcohol recommendations (less than or equal to 1 drink/day) reviewed; regular seatbelt use; changing batteries in smoke detectors. Immunization recommendations discussed--continue yearly high dose flu shots. Tetanus  booster will be due in August, to get at pharmacy. Colonoscopy recommendations reviewed--UTD.     MOST form discussed, unable to sign due to virtual nature of visit, but is unchanged. Full Code, Full Care.   Follow Up Instructions:  I discussed the assessment and treatment plan with the patient. The patient was provided an opportunity to ask questions and all were answered. The patient agreed with the plan and demonstrated an understanding of the instructions.   Follow up in June for CPE (breast/pelvic due)--NOT due for med check (since done today), so no other charges unless there are other complaints issues  I provided 33 minutes of non-face-to-face time during this encounter.   Vikki Ports, MD  Medicare Attestation I have personally reviewed: The patient's medical and social history Their use of alcohol, tobacco or illicit drugs Their current medications and supplements The patient's functional ability including ADLs,fall risks, home safety risks, cognitive, and hearing and visual impairment Diet and physical activities Evidence for depression or mood disorders

## 2018-06-03 ENCOUNTER — Ambulatory Visit (INDEPENDENT_AMBULATORY_CARE_PROVIDER_SITE_OTHER): Payer: Medicare Other | Admitting: Family Medicine

## 2018-06-03 ENCOUNTER — Encounter: Payer: Self-pay | Admitting: Family Medicine

## 2018-06-03 ENCOUNTER — Other Ambulatory Visit: Payer: Self-pay

## 2018-06-03 VITALS — BP 138/78 | HR 80 | Ht 59.75 in | Wt 190.6 lb

## 2018-06-03 DIAGNOSIS — B001 Herpesviral vesicular dermatitis: Secondary | ICD-10-CM | POA: Diagnosis not present

## 2018-06-03 DIAGNOSIS — Z8249 Family history of ischemic heart disease and other diseases of the circulatory system: Secondary | ICD-10-CM | POA: Diagnosis not present

## 2018-06-03 DIAGNOSIS — Z0000 Encounter for general adult medical examination without abnormal findings: Secondary | ICD-10-CM

## 2018-06-03 DIAGNOSIS — M8580 Other specified disorders of bone density and structure, unspecified site: Secondary | ICD-10-CM | POA: Diagnosis not present

## 2018-06-03 DIAGNOSIS — E7800 Pure hypercholesterolemia, unspecified: Secondary | ICD-10-CM | POA: Diagnosis not present

## 2018-06-03 DIAGNOSIS — R7301 Impaired fasting glucose: Secondary | ICD-10-CM

## 2018-06-03 MED ORDER — ROSUVASTATIN CALCIUM 10 MG PO TABS: 10.0000 mg | ORAL_TABLET | Freq: Every day | ORAL | 3 refills | Status: DC

## 2018-06-05 ENCOUNTER — Ambulatory Visit: Payer: Self-pay | Admitting: Family Medicine

## 2018-07-30 NOTE — Patient Instructions (Addendum)
HEALTH MAINTENANCE RECOMMENDATIONS:  It is recommended that you get at least 30 minutes of aerobic exercise at least 5 days/week (for weight loss, you may need as much as 60-90 minutes). This can be any activity that gets your heart rate up. This can be divided in 10-15 minute intervals if needed, but try and build up your endurance at least once a week.  Weight bearing exercise is also recommended twice weekly.  Eat a healthy diet with lots of vegetables, fruits and fiber.  "Colorful" foods have a lot of vitamins (ie green vegetables, tomatoes, red peppers, etc).  Limit sweet tea, regular sodas and alcoholic beverages, all of which has a lot of calories and sugar.  Up to 1 alcoholic drink daily may be beneficial for women (unless trying to lose weight, watch sugars).  Drink a lot of water.  Calcium recommendations are 1200-1500 mg daily (1500 mg for postmenopausal women or women without ovaries), and vitamin D 1000 IU daily.  This should be obtained from diet and/or supplements (vitamins), and calcium should not be taken all at once, but in divided doses.  Monthly self breast exams and yearly mammograms for women over the age of 108 is recommended.  Sunscreen of at least SPF 30 should be used on all sun-exposed parts of the skin when outside between the hours of 10 am and 4 pm (not just when at beach or pool, but even with exercise, golf, tennis, and yard work!)  Use a sunscreen that says "broad spectrum" so it covers both UVA and UVB rays, and make sure to reapply every 1-2 hours.  Remember to change the batteries in your smoke detectors when changing your clock times in the spring and fall.  Use your seat belt every time you are in a car, and please drive safely and not be distracted with cell phones and texting while driving.   This is a list of the screening recommended for you and due dates:  Health Maintenance  Topic Date Due  . Flu Shot (high dose) Fall 2020  . Tetanus Vaccine  (Td or  TdaP)--get from pharmacy 10/06/2018  . Mammogram  11/30/2017  . Colon Cancer Screening  07/29/2023  . DEXA scan (bone density measurement)  Repeat recommended  .  Hepatitis C: One time screening is recommended by Center for Disease Control  (CDC) for  adults born from [birth year] through 1966.   Completed  . Pneumonia vaccines  Completed   Td or Tdap (tetanus booster) needs to be gotten from the pharmacy; due in August.  Bone density screening is recommended to be repeated, due to you having some thinning of the bones (osteopenia) noted on your test in 2016.  Please call [facility] to schedule this test, along with your next mammogram  (they can fax [place] the order for the bone density test). We will check to see if you had a mammogram at [facility] in 2019 (we haven't received one).  If you did NOT have one this past year, then don't wait until October to schedule your next, you can schedule mammogram and bone density test for sooner.  I recommend a routine eye exam.  I recommend you follow-up with your orthopedist for routine follow-up since having a hip replacement (and also due to some ongoing pain, which may be related to bursitis).  You should also contact your dermatologist for routine skin check, given the few areas of concerns that have developed since your last visit there.  Weight loss is strongly  recommended.  We discussed portion control, cutting back on carbs, and getting daily exercise.  If you are having difficulty, and would like assistance, please let us know. We briefly discussed the [facility] clinic, as well as Weight Watchers.   MyPlate from [employer] is an outline of a general healthy diet based on the 2010 Dietary Guidelines for Americans, from the U.S. Department of Agriculture Scientist, research (physical sciences)). It sets guidelines for how much food you should eat from each food group based on your age, sex, and level of physical activity. What are tips for following MyPlate? To follow  MyPlate recommendations:  Eat a wide variety of fruits and vegetables, grains, and protein foods.  Serve smaller portions and eat less food throughout the day.  Limit portion sizes to avoid overeating.  Enjoy your food.  Get at least 150 minutes of exercise every week. This is about 30 minutes each day, 5 or more days per week. It can be difficult to have every meal look like MyPlate. Think about MyPlate as eating guidelines for an entire day, rather than each individual meal. Fruits and vegetables  Make half of your plate fruits and vegetables.  Eat many different colors of fruits and vegetables each day.  For a 2,000 calorie daily food plan, eat: ? 2 cups of vegetables every day. ? 2 cups of fruit every day.  1 cup is equal to: ? 1 cup raw or cooked vegetables. ? 1 cup raw fruit. ? 1 medium-sized orange, apple, or banana. ? 1 cup 100% fruit or vegetable juice. ? 2 cups raw leafy greens, such as lettuce, spinach, or kale. ?  cup dried fruit. Grains  One fourth of your plate should be grains.  Make at least half of the grains you eat each day whole grains.  For a 2,000 calorie daily food plan, eat 6 oz of grains every day.  1 oz is equal to: ? 1 slice bread. ? 1 cup cereal. ?  cup cooked rice, cereal, or pasta. Protein  One fourth of your plate should be protein.  Eat a wide variety of protein foods, including meat, poultry, fish, eggs, beans, nuts, and tofu.  For a 2,000 calorie daily food plan, eat 5 oz of protein every day.  1 oz is equal to: ? 1 oz meat, poultry, or fish. ?  cup cooked beans. ? 1 egg. ?  oz nuts or seeds. ? 1 Tbsp peanut butter. Dairy  Drink fat-free or low-fat (1%) milk.  Eat or drink dairy as a side to meals.  For a 2,000 calorie daily food plan, eat or drink 3 cups of dairy every day.  1 cup is equal to: ? 1 cup milk, yogurt, cottage cheese, or soy milk (soy beverage). ? 2 oz processed cheese. ? 1 oz natural cheese.  Fats, oils, salt, and sugars  Only small amounts of oils are recommended.  Avoid foods that are high in calories and low in nutritional value (empty calories), like foods high in fat or added sugars.  Choose foods that are low in salt (sodium). Choose foods that have less than 140 milligrams (mg) of sodium per serving.  Drink water instead of sugary drinks. Drink enough water each day to keep your urine pale yellow. Where to find support  Work with your health care provider or a nutrition specialist (dietitian) to develop a customized eating plan that is right for you.  Download an app (mobile application) to help you track  your daily food intake. Where to find more information  Go to [email or web address] for more information.  Learn more and log your daily food intake according to MyPlate using USDA's SuperTracker: www.supertracker.usda.gov Summary  MyPlate is a general guideline for healthy eating from the USDA. It is based on the 2010 Dietary Guidelines for Americans.  In general, fruits and vegetables should take up  of your plate, grains should take up  of your plate, and protein should take up  of your plate. This information is not intended to replace advice given to you by your health care provider. Make sure you discuss any questions you have with your health care provider. Document Released: 03/05/2007 Document Revised: 05/15/2016 Document Reviewed: 05/15/2016 Elsevier Interactive Patient Education  2019 Elsevier Inc.   Hip Bursitis  Hip bursitis is swelling of a fluid-filled sac (bursa) in your hip. This swelling (inflammation) can be painful. This condition may come and go over time. Follow these instructions at home: Medicines  Take over-the-counter and prescription medicines only as told by your doctor.  Do not drive or use heavy machinery while taking prescription pain medicine, or as told by your doctor.  If you were prescribed an antibiotic medicine, take it  as told by your doctor. Do not stop taking the antibiotic even if you start to feel better. Activity  Return to your normal activities as told by your doctor. Ask your doctor what activities are safe for you.  Rest and protect your hip until you feel better. General instructions  Wear wraps that put pressure on your hip (compression wraps) only as told by your doctor.  Raise (elevate) your hip above the level of your heart as much as you can. To do this, try putting a pillow under your hips while you lie down. Stop if this causes pain.  Do not use your hip to support your body weight until your doctor says that you can.  Use crutches as told by your doctor.  Gently rub and stretch your injured area as often as is comfortable.  Keep all follow-up visits as told by your doctor. This is important. How is this prevented?  Exercise regularly, as told by your doctor.  Warm up and stretch before being active.  Cool down and stretch after being active.  Avoid activities that bother your hip or cause pain.  Avoid sitting down for long periods at a time. Contact a doctor if:  You have a fever.  You get new symptoms.  You have trouble walking.  You have trouble doing everyday activities.  You have pain that gets worse.  You have pain that does not get better with medicine.  You get red skin on your hip area.  You get a feeling of warmth in your hip area. Get help right away if:  You cannot move your hip.  You have very bad pain. This information is not intended to replace advice given to you by your health care provider. Make sure you discuss any questions you have with your health care provider. Document Released: 03/18/2010 Document Revised: 07/22/2015 Document Reviewed: 09/15/2014 Elsevier Interactive Patient Education  2019 [employer].

## 2018-07-30 NOTE — Progress Notes (Signed)
Chief Complaint  Patient presents with  . Medicare Wellness    nonfasting AWV with pelvic. No concerns.     Lauren [name] is a 68 y.o. female who presents for a complete physical. She had med check and AWV in April. (Nurse didn't recall that AWV was done in April, all questions repeated today--negative fall and depression screens, Functional status survey remarkable only for rare leakage of urine; mini-Cog 4/5 today (missed 1 word on recall, normal clock)). She denies any complaints or concerns today.  She is back to work, less stressed (about bills, finances).   She is s/p hip replacement. She reports having residual right hip pain.  Has pain in right hip when walks for a long time. Last visit to her ortho was about a year ago, per patient. Meloxicam and cortisone shots (for bursitis) have helped temporarily in the past. Pain is at the side of her hip, not the groin.  Immunization History  Administered Date(s) Administered  . Influenza Split 11/24/2010  . Influenza, High Dose Seasonal PF 10/29/2014, 12/23/2015, 01/11/2017, 12/10/2017  . Influenza,inj,Quad PF,6+ Mos 10/27/2013  . Pneumococcal Conjugate-13 10/29/2014  . Pneumococcal Polysaccharide-23 10/05/2008, 01/06/2016  . Td 02/28/2000  . Tdap 10/05/2008  . Zoster 11/24/2010  . Zoster Recombinat (Shingrix) 10/04/2017, 12/17/2017   Last Pap smear: 12/2016, no high risk HPV detected Last mammogram:11/2016 Last colonoscopy:07/2016 (scattered diverticula, Dr. Collene Mares) Last DEXA:10/2014 ([facility])--T-1.6 Dentist:once yearly. Ophtho:about 2 years ago Exercise: walks a lot (on the lot, going to auctions, chasing great-grandkids) and yardwork.  Not aerobic.  No regular weight-bearing exercise.  She didn't work for 2 months, so was walking less.  PMH, PSH, SH and FH reviewed/updated  Reports improved compliance in remembering to take her Crestor since her last visit.  Outpatient Encounter Medications as of 07/31/2018  Medication Sig Note   . APPLE CIDER VINEGAR PO Take 2 capsules by mouth daily.   . cholecalciferol (VITAMIN D) 1000 units tablet Take 1,000 Units by mouth daily.   . cyanocobalamin 1000 MCG tablet Take 1,000 mcg by mouth daily.   . Garlic 433 MG CAPS Take 1 capsule by mouth daily.   . Omega-3 Fatty Acids (FISH OIL) 1000 MG CAPS Take 1 capsule by mouth daily.   . rosuvastatin (CRESTOR) 10 MG tablet Take 1 tablet (10 mg total) by mouth daily.   . cetirizine (ZYRTEC) 10 MG tablet Take 10 mg by mouth daily as needed for allergies.  01/11/2017: Uses prn allergies  . valACYclovir (VALTREX) 1000 MG tablet TAKE 2 TABLETS AT ONSET OF COLD SORE, FOLLOWED BY A SECOND DOSE 12 HOURS LATER (4 TABS PER OUTBREAK) (Patient not taking: Reported on 06/03/2018)   . [DISCONTINUED] ferrous sulfate 325 (65 FE) MG tablet Take 1 tablet (325 mg total) by mouth 3 (three) times daily after meals. (Patient not taking: Reported on 07/31/2018) 01/06/2016: Takes it once daily   No facility-administered encounter medications on file as of 07/31/2018.    Allergies  Allergen Reactions  . Morphine And Related     Patient prefers not to received"became addictive to this many years ago"- Demerol is okay    ROS: The patient denies anorexia, fever, headaches, vision changes, decreased hearing, ear pain, sore throat, breast concerns, chest pain, palpitations, dizziness, syncope, dyspnea on exertion, cough, swelling, nausea, vomiting, diarrhea, constipation, abdominal pain, melena, hematochezia, indigestion/heartburn, hematuria, dysuria, vaginal bleeding, discharge, odor or itch, genital lesions, numbness, tingling, weakness, tremor, suspicious skin lesions (sees derm yearly), depression, anxiety, abnormal bleeding/bruising, or enlarged lymph  nodes.  She has scabbed areas on her right thigh and left breast, not healing; plans to call dermatologist. Some leakage of urine with sneeze/cough, lifting heavy object, only with full bladder. Intermittent insomnia, not  currently a problem. Right lateral hip pain  Weight gain since last visit--feeling somewhat depressed, at home, worried about her bills.  Opened back up 3-4 weeks and moods are much better.   PHYSICAL EXAM:  BP 120/68   Pulse 76   Temp 98.3 F (36.8 C) (Oral)   Ht 4\' 11"  (1.499 m)   Wt 195 lb 6.4 oz (88.6 kg)   BMI 39.47 kg/m   Wt Readings from Last 3 Encounters:  06/03/18 190 lb 9.6 oz (86.5 kg)  12/10/17 186 lb 3.2 oz (84.5 kg)  11/05/17 185 lb 3.2 oz (84 kg)    General Appearance:  Alert, cooperative, no distress, appears stated age   Head:  Normocephalic, without obvious abnormality, atraumatic   Eyes:  PERRL, EOM's intact, fundi benign.   Ears:  Normal TM's and external ear canals   Nose:  Not examined due to coronavirus (wearing mask)   Throat:  Not examined due to coronavirus (wearing mask)    Neck:  Supple, no lymphadenopathy; thyroid: no enlargement/tenderness/nodules; no carotid bruit or JVD   Back:  Spine nontender, no curvature, ROM normal, no CVA tenderness   Lungs:  Clear to auscultation bilaterally without wheezes, rales or ronchi; respirations unlabored   Chest Wall:  No tenderness or deformity   Heart:  Regular rate and rhythm, S1 and S2 normal, no murmur, rub or gallop   Breast Exam:  No tenderness, masses, or nipple discharge or inversion. No axillary lymphadenopathy.   Abdomen:  Soft, non-tender, nondistended, normoactive bowel sounds, no masses, no hepatosplenomegaly. WHSS  Genitalia:  Normal external genitalia without lesions. BUS and vagina normal; No abnormal vaginal discharge. Uterus and adnexa not enlarged, nontender, no masses. Pap not performed.  Rectal:  Normal tone, no masses or tenderness; guaiac negative stool   Extremities:  No clubbing, cyanosis or edema.WHSS right lateral hip. Tender at right trochanteric bursa, nontender on the left.  Pulses:  2+ and symmetric all extremities   Skin:  Skin color, texture,  turgor normal, no lesions.Actinic changes to the skin on chest, arms, legs.  Some actinic keratosis and dry skin noted on legs (2 AK's on the RLE noted)  Lymph nodes:  Cervical, supraclavicular, and axillary nodes normal   Neurologic:  CNII-XII intact, normal strength, sensation and gait; reflexes 2+ and symmetric throughout   Psych: Normal mood, affect, hygiene and grooming   Lab Results  Component Value Date   HGBA1C 5.7 (A) 07/31/2018    ASSESSMENT/PLAN:  Annual physical exam  Impaired fasting glucose - discussed importance of weight loss, daily exercise, cutting back on carbs and sugar - Plan: HgB A1c  Trochanteric bursitis, right hip - She is due (past?) to see ortho; will schedule there, and possibly get another cortisone injection.  Actinic keratosis - RLE--due to f/u with her dermatologist for full skin check/treatments   F/u with derm F/u routine eye exam F/u ortho  Will check with [facility] to see if she ever had mammo last year.  Discussed monthly self breast exams and yearly mammograms; at least 30 minutes of aerobic activity at least 5 days/week, weight-bearing exercise 2x/week; proper sunscreen use reviewed; healthy diet, including goals of calcium and vitamin D intake and alcohol recommendations (less than or equal to 1 drink/day) reviewed; regular seatbelt use; changing  batteries in smoke detectors. Immunization recommendations discussed--continue yearly high dose flu shots. Tetanus booster (Td or TdaP) will be due in August, to get at pharmacy. Colonoscopy recommendations reviewed--UTD.  Osteopenia--repeat dexa ([facility])--discuss with pt, can do when she gets her mammo next.   F/u 1 year for CPE/AWV/med check, sooner prn

## 2018-07-31 ENCOUNTER — Encounter: Payer: Self-pay | Admitting: Family Medicine

## 2018-07-31 ENCOUNTER — Ambulatory Visit (INDEPENDENT_AMBULATORY_CARE_PROVIDER_SITE_OTHER): Payer: Medicare Other | Admitting: Family Medicine

## 2018-07-31 ENCOUNTER — Other Ambulatory Visit: Payer: Self-pay

## 2018-07-31 VITALS — BP 120/68 | HR 76 | Temp 98.3°F | Ht 59.0 in | Wt 195.4 lb

## 2018-07-31 DIAGNOSIS — R7301 Impaired fasting glucose: Secondary | ICD-10-CM

## 2018-07-31 DIAGNOSIS — M7061 Trochanteric bursitis, right hip: Secondary | ICD-10-CM

## 2018-07-31 DIAGNOSIS — Z0000 Encounter for general adult medical examination without abnormal findings: Secondary | ICD-10-CM | POA: Diagnosis not present

## 2018-07-31 DIAGNOSIS — L570 Actinic keratosis: Secondary | ICD-10-CM | POA: Diagnosis not present

## 2018-07-31 LAB — POCT GLYCOSYLATED HEMOGLOBIN (HGB A1C): Hemoglobin A1C: 5.7 % — AB (ref 4.0–5.6)

## 2018-08-01 ENCOUNTER — Other Ambulatory Visit: Payer: Self-pay

## 2018-08-01 NOTE — Progress Notes (Signed)
Last mammo was 2018, patient informed that she can call now for mammo and DEXA. Mailed AVS and scheduled AWV/CPE/med check for 08/11/2019@8 :45am.

## 2018-09-12 DIAGNOSIS — R29890 Loss of height: Secondary | ICD-10-CM | POA: Diagnosis not present

## 2018-09-12 DIAGNOSIS — Z96641 Presence of right artificial hip joint: Secondary | ICD-10-CM | POA: Diagnosis not present

## 2018-09-12 DIAGNOSIS — Z1231 Encounter for screening mammogram for malignant neoplasm of breast: Secondary | ICD-10-CM | POA: Diagnosis not present

## 2018-09-12 LAB — HM MAMMOGRAPHY

## 2018-09-12 LAB — HM DEXA SCAN: HM Dexa Scan: NORMAL

## 2018-09-18 ENCOUNTER — Encounter: Payer: Self-pay | Admitting: *Deleted

## 2018-10-29 ENCOUNTER — Other Ambulatory Visit: Payer: Self-pay | Admitting: Family Medicine

## 2018-10-29 NOTE — Telephone Encounter (Signed)
[facility] is requesting to fill pt valtrex. Please advise [facility] 

## 2018-12-11 ENCOUNTER — Ambulatory Visit: Payer: Medicare Other | Admitting: Family Medicine

## 2019-01-09 DIAGNOSIS — C44722 Squamous cell carcinoma of skin of right lower limb, including hip: Secondary | ICD-10-CM | POA: Diagnosis not present

## 2019-01-09 DIAGNOSIS — C44729 Squamous cell carcinoma of skin of left lower limb, including hip: Secondary | ICD-10-CM | POA: Diagnosis not present

## 2019-01-09 DIAGNOSIS — L570 Actinic keratosis: Secondary | ICD-10-CM | POA: Diagnosis not present

## 2019-01-09 DIAGNOSIS — L820 Inflamed seborrheic keratosis: Secondary | ICD-10-CM | POA: Diagnosis not present

## 2019-01-15 ENCOUNTER — Encounter: Payer: Self-pay | Admitting: Family Medicine

## 2019-01-16 ENCOUNTER — Other Ambulatory Visit: Payer: Self-pay

## 2019-05-29 DIAGNOSIS — U071 COVID-19: Secondary | ICD-10-CM

## 2019-05-29 HISTORY — DX: COVID-19: U07.1

## 2019-06-09 DIAGNOSIS — U071 COVID-19: Secondary | ICD-10-CM | POA: Diagnosis not present

## 2019-06-09 DIAGNOSIS — Z03818 Encounter for observation for suspected exposure to other biological agents ruled out: Secondary | ICD-10-CM | POA: Diagnosis not present

## 2019-06-12 ENCOUNTER — Ambulatory Visit: Payer: Medicare Other | Admitting: Cardiology

## 2019-06-12 ENCOUNTER — Other Ambulatory Visit: Payer: Self-pay | Admitting: Physician Assistant

## 2019-06-12 ENCOUNTER — Encounter: Payer: Self-pay | Admitting: Family Medicine

## 2019-06-12 ENCOUNTER — Telehealth: Payer: Self-pay | Admitting: *Deleted

## 2019-06-12 ENCOUNTER — Ambulatory Visit (INDEPENDENT_AMBULATORY_CARE_PROVIDER_SITE_OTHER): Payer: Medicare Other | Admitting: Family Medicine

## 2019-06-12 VITALS — Temp 99.5°F | Ht 59.0 in

## 2019-06-12 DIAGNOSIS — R059 Cough, unspecified: Secondary | ICD-10-CM

## 2019-06-12 DIAGNOSIS — R05 Cough: Secondary | ICD-10-CM | POA: Diagnosis not present

## 2019-06-12 DIAGNOSIS — U071 COVID-19: Secondary | ICD-10-CM | POA: Diagnosis not present

## 2019-06-12 DIAGNOSIS — I10 Essential (primary) hypertension: Secondary | ICD-10-CM

## 2019-06-12 MED ORDER — ALBUTEROL SULFATE HFA 108 (90 BASE) MCG/ACT IN AERS: 2.0000 | INHALATION_SPRAY | Freq: Four times a day (QID) | RESPIRATORY_TRACT | 1 refills | Status: DC | PRN

## 2019-06-12 MED ORDER — BENZONATATE 200 MG PO CAPS: 200.0000 mg | ORAL_CAPSULE | Freq: Three times a day (TID) | ORAL | 0 refills | Status: DC | PRN

## 2019-06-12 NOTE — Telephone Encounter (Signed)
Left message @ [phone] with name, DOB and MRN number referring patient.

## 2019-06-12 NOTE — Progress Notes (Addendum)
  I connected by phone with Lauren [name] on 06/12/2019 at 4:17 PM to discuss the potential use of an new treatment for mild to moderate COVID-19 viral infection in non-hospitalized patients.  This patient is a 69 y.o. female that meets the FDA criteria for Emergency Use Authorization of bamlanivimab/etesevimab or casirivimab/imdevimab.  Has a (+) direct SARS-CoV-2 viral test result  Has mild or moderate COVID-19   Is ? 70 years of age and weighs ? 40 kg  Is NOT hospitalized due to COVID-19  Is NOT requiring oxygen therapy or requiring an increase in baseline oxygen flow rate due to COVID-19  Is within 10 days of symptom onset  Has at least one of the high risk factor(s) for progression to severe COVID-19 and/or hospitalization as defined in EUA.  Specific high risk criteria : >/= 70 yo, HTN   I have spoken and communicated the following to the patient or parent/caregiver:  1. FDA has authorized the emergency use of bamlanivimab/etesevimab and casirivimab\imdevimab for the treatment of mild to moderate COVID-19 in adults and pediatric patients with positive results of direct SARS-CoV-2 viral testing who are 11 years of age and older weighing at least 40 kg, and who are at high risk for progressing to severe COVID-19 and/or hospitalization.  2. The significant known and potential risks and benefits of bamlanivimab/etesevimab and casirivimab\imdevimab, and the extent to which such potential risks and benefits are unknown.  3. Information on available alternative treatments and the risks and benefits of those alternatives, including clinical trials.  4. Patients treated with bamlanivimab/etesevimab and casirivimab\imdevimab should continue to self-isolate and use infection control measures (e.g., wear mask, isolate, social distance, avoid sharing personal items, clean and disinfect "high touch" surfaces, and frequent handwashing) according to CDC guidelines.   5. The patient or  parent/caregiver has the option to accept or refuse bamlanivimab/etesevimab or casirivimab\imdevimab .  After reviewing this information with the patient, The patient agreed to proceed with receiving the bamlanivimab/etesevimab infusion and will be provided a copy of the Fact sheet prior to receiving the infusion.  Sx onset 4/11. Infusion set up for 06/13/19 @ 12:30am. Directions given  Angelena Form 06/12/2019 4:17 PM   CC: Dr. Tomi Bamberger

## 2019-06-12 NOTE — Patient Instructions (Addendum)
Continue Mucinex twice daily. Consider changing to Mucinex DM vs adding a separate Delsym syrup to your plain Mucinex. Use the benzonatate up to three times daily if needed for cough. Use the albuterol inhaler every 4-6 hours if needed for chest tightness, wheezing, shortness or breath. Take 2 puffs at a time--remember to breathe in at the same time that you press down, and hold your breath for a bit, then repeat (for the second puff).  We are referring you for monoclonal antibody treatment through [facility].  You will get a call from someone at the hospital.    COVID-19 COVID-19 is a respiratory infection that is caused by a virus called severe acute respiratory syndrome coronavirus 2 (SARS-CoV-2). The disease is also known as coronavirus disease or novel coronavirus. In some people, the virus may not cause any symptoms. In others, it may cause a serious infection. The infection can get worse quickly and can lead to complications, such as:  Pneumonia, or infection of the lungs.  Acute respiratory distress syndrome or ARDS. This is a condition in which fluid build-up in the lungs prevents the lungs from filling with air and passing oxygen into the blood.  Acute respiratory failure. This is a condition in which there is not enough oxygen passing from the lungs to the body or when carbon dioxide is not passing from the lungs out of the body.  Sepsis or septic shock. This is a serious bodily reaction to an infection.  Blood clotting problems.  Secondary infections due to bacteria or fungus.  Organ failure. This is when your body's organs stop working. The virus that causes COVID-19 is contagious. This means that it can spread from person to person through droplets from coughs and sneezes (respiratory secretions). What are the causes? This illness is caused by a virus. You may catch the virus by:  Breathing in droplets from an infected person. Droplets can be spread by a person breathing,  speaking, singing, coughing, or sneezing.  Touching something, like a table or a doorknob, that was exposed to the virus (contaminated) and then touching your mouth, nose, or eyes. What increases the risk? Risk for infection You are more likely to be infected with this virus if you:  Are within 6 feet (2 meters) of a person with COVID-19.  Provide care for or live with a person who is infected with COVID-19.  Spend time in crowded indoor spaces or live in shared housing. Risk for serious illness You are more likely to become seriously ill from the virus if you:  Are 65 years of age or older. The higher your age, the more you are at risk for serious illness.  Live in a nursing home or long-term care facility.  Have cancer.  Have a long-term (chronic) disease such as: ? Chronic lung disease, including chronic obstructive pulmonary disease or asthma. ? A long-term disease that lowers your body's ability to fight infection (immunocompromised). ? Heart disease, including heart failure, a condition in which the arteries that lead to the heart become narrow or blocked (coronary artery disease), a disease which makes the heart muscle thick, weak, or stiff (cardiomyopathy). ? Diabetes. ? Chronic kidney disease. ? Sickle cell disease, a condition in which red blood cells have an abnormal "sickle" shape. ? Liver disease.  Are obese. What are the signs or symptoms? Symptoms of this condition can range from mild to severe. Symptoms may appear any time from 2 to 14 days after being exposed to the virus. They  include:  A fever or chills.  A cough.  Difficulty breathing.  Headaches, body aches, or muscle aches.  Runny or stuffy (congested) nose.  A sore throat.  New loss of taste or smell. Some people may also have stomach problems, such as nausea, vomiting, or diarrhea. Other people may not have any symptoms of COVID-19. How is this diagnosed? This condition may be diagnosed based  on:  Your signs and symptoms, especially if: ? You live in an area with a COVID-19 outbreak. ? You recently traveled to or from an area where the virus is common. ? You provide care for or live with a person who was diagnosed with COVID-19. ? You were exposed to a person who was diagnosed with COVID-19.  A physical exam.  Lab tests, which may include: ? Taking a sample of fluid from the back of your nose and throat (nasopharyngeal fluid), your nose, or your throat using a swab. ? A sample of mucus from your lungs (sputum). ? Blood tests.  Imaging tests, which may include, X-rays, CT scan, or ultrasound. How is this treated? At present, there is no medicine to treat COVID-19. Medicines that treat other diseases are being used on a trial basis to see if they are effective against COVID-19. Your health care provider will talk with you about ways to treat your symptoms. For most people, the infection is mild and can be managed at home with rest, fluids, and over-the-counter medicines. Treatment for a serious infection usually takes places in a hospital intensive care unit (ICU). It may include one or more of the following treatments. These treatments are given until your symptoms improve.  Receiving fluids and medicines through an IV.  Supplemental oxygen. Extra oxygen is given through a tube in the nose, a face mask, or a hood.  Positioning you to lie on your stomach (prone position). This makes it easier for oxygen to get into the lungs.  Continuous positive airway pressure (CPAP) or bi-level positive airway pressure (BPAP) machine. This treatment uses mild air pressure to keep the airways open. A tube that is connected to a motor delivers oxygen to the body.  Ventilator. This treatment moves air into and out of the lungs by using a tube that is placed in your windpipe.  Tracheostomy. This is a procedure to create a hole in the neck so that a breathing tube can be  inserted.  Extracorporeal membrane oxygenation (ECMO). This procedure gives the lungs a chance to recover by taking over the functions of the heart and lungs. It supplies oxygen to the body and removes carbon dioxide. Follow these instructions at home: Lifestyle  If you are sick, stay home except to get medical care. Your health care provider will tell you how long to stay home. Call your health care provider before you go for medical care.  Rest at home as told by your health care provider.  Do not use any products that contain nicotine or tobacco, such as cigarettes, e-cigarettes, and chewing tobacco. If you need help quitting, ask your health care provider.  Return to your normal activities as told by your health care provider. Ask your health care provider what activities are safe for you. General instructions  Take over-the-counter and prescription medicines only as told by your health care provider.  Drink enough fluid to keep your urine pale yellow.  Keep all follow-up visits as told by your health care provider. This is important. How is this prevented?  There is no  vaccine to help prevent COVID-19 infection. However, there are steps you can take to protect yourself and others from this virus. To protect yourself:   Do not travel to areas where COVID-19 is a risk. The areas where COVID-19 is reported change often. To identify high-risk areas and travel restrictions, check the CDC travel website: FatFares.com.br  If you live in, or must travel to, an area where COVID-19 is a risk, take precautions to avoid infection. ? Stay away from people who are sick. ? Wash your hands often with soap and water for 20 seconds. If soap and water are not available, use an alcohol-based hand sanitizer. ? Avoid touching your mouth, face, eyes, or nose. ? Avoid going out in public, follow guidance from your state and local health authorities. ? If you must go out in public, wear a  cloth face covering or face mask. Make sure your mask covers your nose and mouth. ? Avoid crowded indoor spaces. Stay at least 6 feet (2 meters) away from others. ? Disinfect objects and surfaces that are frequently touched every day. This may include:  Counters and tables.  Doorknobs and light switches.  Sinks and faucets.  Electronics, such as phones, remote controls, keyboards, computers, and tablets. To protect others: If you have symptoms of COVID-19, take steps to prevent the virus from spreading to others.  If you think you have a COVID-19 infection, contact your health care provider right away. Tell your health care team that you think you may have a COVID-19 infection.  Stay home. Leave your house only to seek medical care. Do not use public transport.  Do not travel while you are sick.  Wash your hands often with soap and water for 20 seconds. If soap and water are not available, use alcohol-based hand sanitizer.  Stay away from other members of your household. Let healthy household members care for children and pets, if possible. If you have to care for children or pets, wash your hands often and wear a mask. If possible, stay in your own room, separate from others. Use a different bathroom.  Make sure that all people in your household wash their hands well and often.  Cough or sneeze into a tissue or your sleeve or elbow. Do not cough or sneeze into your hand or into the air.  Wear a cloth face covering or face mask. Make sure your mask covers your nose and mouth. Where to find more information  Centers for Disease Control and Prevention: PurpleGadgets.be  World Health Organization: https://www.castaneda.info/ Contact a health care provider if:  You live in or have traveled to an area where COVID-19 is a risk and you have symptoms of the infection.  You have had contact with someone who has COVID-19 and you have symptoms of the  infection. Get help right away if:  You have trouble breathing.  You have pain or pressure in your chest.  You have confusion.  You have bluish lips and fingernails.  You have difficulty waking from sleep.  You have symptoms that get worse. These symptoms may represent a serious problem that is an emergency. Do not wait to see if the symptoms will go away. Get medical help right away. Call your local emergency services (911 in the [place] Do not drive yourself to the hospital. Let the emergency medical personnel know if you think you have COVID-19. Summary  COVID-19 is a respiratory infection that is caused by a virus. It is also known as coronavirus disease  or novel coronavirus. It can cause serious infections, such as pneumonia, acute respiratory distress syndrome, acute respiratory failure, or sepsis.  The virus that causes COVID-19 is contagious. This means that it can spread from person to person through droplets from breathing, speaking, singing, coughing, or sneezing.  You are more likely to develop a serious illness if you are 79 years of age or older, have a weak immune system, live in a nursing home, or have chronic disease.  There is no medicine to treat COVID-19. Your health care provider will talk with you about ways to treat your symptoms.  Take steps to protect yourself and others from infection. Wash your hands often and disinfect objects and surfaces that are frequently touched every day. Stay away from people who are sick and wear a mask if you are sick. This information is not intended to replace advice given to you by your health care provider. Make sure you discuss any questions you have with your health care provider. Document Revised: 12/13/2018 Document Reviewed: 03/21/2018 Elsevier Patient Education  [place].

## 2019-06-12 NOTE — Progress Notes (Signed)
Start time: 1:30 End time: 1:56  Virtual Visit via Video Note  I connected with Lauren [name] on 06/12/19 at  1:30 PM EDT by a video enabled telemedicine application and verified that I am speaking with the correct person using two identifiers.  Location: Patient: home Provider: office   I discussed the limitations of evaluation and management by telemedicine and the availability of in person appointments. The patient expressed understanding and agreed to proceed.  History of Present Illness: Chief Complaint  Patient presents with  . Follow-up    COVID follow up-postive COVID rapid on Monday 4/12. Coughing, fever and HA. Having some fatigue.    She started running a fever Sunday night, started with cough and congestion. Didn't sleep much that night. Temp was a little over 100 +COVID antigen test done at [facility] on 4/12.  No loss of taste or smell. No nausea, vomiting or diarrhea. No bleeding, bruising, rash. Short of breath only after a coughing spell, not with exertion.  Cough is productive of a small amount, mostly dry.  Phlegm is slightly yellow. Nasal drainage is pretty clear. +headache on top of her head, somewhat into her forehead.  PMH, PSH, SH reviewed  Outpatient Encounter Medications as of 06/12/2019  Medication Sig Note  . acetaminophen (TYLENOL) 500 MG tablet Take 500 mg by mouth every 6 (six) hours as needed. 06/12/2019: Last dose 7am  . cholecalciferol (VITAMIN D) 1000 units tablet Take 1,000 Units by mouth daily.   Marland Kitchen guaiFENesin (MUCINEX) 600 MG 12 hr tablet Take 600 mg by mouth 2 (two) times daily. 06/12/2019: Last dose 7am  . rosuvastatin (CRESTOR) 10 MG tablet Take 1 tablet (10 mg total) by mouth daily.   . APPLE CIDER VINEGAR PO Take 2 capsules by mouth daily.   . cetirizine (ZYRTEC) 10 MG tablet Take 10 mg by mouth daily as needed for allergies.  01/11/2017: Uses prn allergies  . cyanocobalamin 1000 MCG tablet Take 1,000 mcg by mouth daily.   . Garlic [id number] MG  CAPS Take 1 capsule by mouth daily.   . Omega-3 Fatty Acids (FISH OIL) 1000 MG CAPS Take 1 capsule by mouth daily.   . valACYclovir (VALTREX) 1000 MG tablet TAKE 2 TABLETS AT ONSET OF COLD SORE FOLLOWED BY SECOND DOSE 12 HOURS LATER. 4 TABS PER BREAKOUT (Patient not taking: Reported on 06/12/2019)    No facility-administered encounter medications on file as of 06/12/2019.   Allergies  Allergen Reactions  . Morphine And Related     Patient prefers not to received"became addictive to this many years ago"- Demerol is okay   ROS: +fever, URI symptoms, cough, SOB per HPI.  No GI, GU, neuro or skin complaints.  See HPI   Observations/Objective:  Temp 99.5 F (37.5 C) (Oral)   Ht 4\' 11"  (1.499 m)   BMI 39.47 kg/m   Observed over video for just a short bit--video froze and conversation was completed over the telephone for better connection (her internet was poor). She is alert, oriented.  Had occasional deep cough. She was speaking comfortably in full sentences. She was alert, oriented, and cranial nerves were grossly intact for the short video portion. Exam was limited due to virtual nature of the visit.    Assessment and Plan:  COVID-19 virus infection - Plan: MyChart COVID-19 home monitoring program, albuterol (VENTOLIN HFA) 108 (90 Base) MCG/ACT inhaler, benzonatate (TESSALON) 200 MG capsule, Temperature monitoring  Cough - Plan: albuterol (VENTOLIN HFA) 108 (90 Base) MCG/ACT inhaler, benzonatate (TESSALON)  200 MG capsule  Counseled re: COVID infection, recommended monoclonal antibody treatment. Supportive measures reviewed. To consider getting pulse oximeter, to help triage/know if getting worse, when to seek care through ER.    Continue Mucinex twice daily. Consider changing to Mucinex DM vs adding a separate Delsym syrup to your plain Mucinex. Use the benzonatate up to three times daily if needed for cough. Use the albuterol inhaler every 4-6 hours if needed for chest  tightness, wheezing, shortness or breath. Take 2 puffs at a time--remember to breathe in at the same time that you press down, and hold your breath for a bit, then repeat (for the second puff).  We are referring you for monoclonal antibody treatment through [facility].  You will get a call from someone at the hospital.   Follow Up Instructions:    I discussed the assessment and treatment plan with the patient. The patient was provided an opportunity to ask questions and all were answered. The patient agreed with the plan and demonstrated an understanding of the instructions.   The patient was advised to call back or seek an in-person evaluation if the symptoms worsen or if the condition fails to improve as anticipated.  I provided 26 minutes of video face-to-face time during this encounter. Additional time spent in documentation, chart review.   Vikki Ports, MD

## 2019-06-13 ENCOUNTER — Encounter (HOSPITAL_COMMUNITY): Payer: Self-pay

## 2019-06-13 ENCOUNTER — Ambulatory Visit (HOSPITAL_COMMUNITY)
Admission: RE | Admit: 2019-06-13 | Discharge: 2019-06-13 | Disposition: A | Discharge status: Home / Self Care | Payer: Medicare Other | Source: Ambulatory Visit | Attending: Pulmonary Disease | Admitting: Pulmonary Disease

## 2019-06-13 DIAGNOSIS — U071 COVID-19: Secondary | ICD-10-CM | POA: Insufficient documentation

## 2019-06-13 DIAGNOSIS — I10 Essential (primary) hypertension: Secondary | ICD-10-CM | POA: Insufficient documentation

## 2019-06-13 DIAGNOSIS — Z23 Encounter for immunization: Secondary | ICD-10-CM | POA: Diagnosis not present

## 2019-06-13 MED ORDER — EPINEPHRINE 0.3 MG/0.3ML IJ SOAJ: 0.3000 mg | Freq: Once | INTRAMUSCULAR | Status: DC | PRN

## 2019-06-13 MED ORDER — SODIUM CHLORIDE 0.9 % IV SOLN: INTRAVENOUS | Status: DC | PRN

## 2019-06-13 MED ORDER — SODIUM CHLORIDE 0.9 % IV SOLN
Freq: Once | INTRAVENOUS | Status: AC
  Filled 2019-06-13: qty 700

## 2019-06-13 MED ORDER — ACETAMINOPHEN 325 MG PO TABS
650.0000 mg | ORAL_TABLET | Freq: Once | ORAL | Status: AC
  Administered 2019-06-13: 12:00:00 650 mg via ORAL
  Filled 2019-06-13: qty 2

## 2019-06-13 MED ORDER — ALBUTEROL SULFATE HFA 108 (90 BASE) MCG/ACT IN AERS: 2.0000 | INHALATION_SPRAY | Freq: Once | RESPIRATORY_TRACT | Status: DC | PRN

## 2019-06-13 MED ORDER — METHYLPREDNISOLONE SODIUM SUCC 125 MG IJ SOLR: 125.0000 mg | Freq: Once | INTRAMUSCULAR | Status: DC | PRN

## 2019-06-13 MED ORDER — FAMOTIDINE IN NACL 20-0.9 MG/50ML-% IV SOLN: 20.0000 mg | Freq: Once | INTRAVENOUS | Status: DC | PRN

## 2019-06-13 MED ORDER — DIPHENHYDRAMINE HCL 50 MG/ML IJ SOLN: 50.0000 mg | Freq: Once | INTRAMUSCULAR | Status: DC | PRN

## 2019-06-13 NOTE — Progress Notes (Signed)
  Diagnosis: COVID-19  Physician:wright   Procedure: [facility] Med: bamlanivimab\etesevimab infusion - Provided patient with bamlanimivab\etesevimab fact sheet for patients, parents and caregivers prior to infusion.  Complications: No immediate complications noted.  Discharge: Discharged home   Heide Scales 06/13/2019

## 2019-06-13 NOTE — Discharge Instructions (Signed)

## 2019-06-26 ENCOUNTER — Other Ambulatory Visit: Payer: Self-pay | Admitting: Family Medicine

## 2019-06-26 DIAGNOSIS — U071 COVID-19: Secondary | ICD-10-CM

## 2019-06-26 DIAGNOSIS — R05 Cough: Secondary | ICD-10-CM

## 2019-06-26 DIAGNOSIS — R059 Cough, unspecified: Secondary | ICD-10-CM

## 2019-06-26 NOTE — Telephone Encounter (Signed)
Is this okay to refill? Patient says at night she is still coughing. Having some throat tickle, congestion and cough. (only in the evening)

## 2019-07-02 ENCOUNTER — Ambulatory Visit (INDEPENDENT_AMBULATORY_CARE_PROVIDER_SITE_OTHER): Payer: Medicare Other | Admitting: Cardiology

## 2019-07-02 ENCOUNTER — Encounter: Payer: Self-pay | Admitting: Cardiology

## 2019-07-02 ENCOUNTER — Other Ambulatory Visit: Payer: Self-pay

## 2019-07-02 VITALS — BP 120/78 | HR 85 | Ht 60.0 in | Wt 193.6 lb

## 2019-07-02 DIAGNOSIS — E7800 Pure hypercholesterolemia, unspecified: Secondary | ICD-10-CM

## 2019-07-02 DIAGNOSIS — I10 Essential (primary) hypertension: Secondary | ICD-10-CM | POA: Diagnosis not present

## 2019-07-02 DIAGNOSIS — Z8249 Family history of ischemic heart disease and other diseases of the circulatory system: Secondary | ICD-10-CM

## 2019-07-02 MED ORDER — ASPIRIN EC 81 MG PO TBEC: 81.0000 mg | DELAYED_RELEASE_TABLET | Freq: Every day | ORAL | 3 refills | Status: DC

## 2019-07-02 NOTE — Progress Notes (Signed)
Cardiology Office Note:    Date:  07/02/2019   ID:  Lauren, [name] February 21, 1951, MRN [id number]  PCP:  Rita Ohara, MD  Cardiologist:  No primary care provider on file.  Electrophysiologist:  None   Referring MD: Rita Ohara, MD   Reason for visit: 3-year follow-up  History of Present Illness:    Lauren [name] is a 69 y.o. female with a hx of severe hip arthritis and requires replacement, we originally saw her as part of preoperative evaluation. She has a very significant family history of premature CAD.  Her mother died of MI age age 39, father had CVA at 48, she is one of 10 siblings, no one made it beyond age 59 and several died of MI - 2 brothers had MIs in 61', received stents and died. Another sister had MI at age 32 and is alive at age 24. She is not being treated for hyperlipidemia.  01/07/2016, the patient is coming after one year, a year ago she underwent total right hip replacement with complications of infection. Tilt is date patient is dealing with pain and difficulties to walk and is being followed by orthopedics. She hasn't been able to exercise as she used to in the past and gained some weight. She denies any chest pain or shortness of breath she has had no claudications no orthopnea or proximal nocturnal dyspnea. She hasn't been fully compliant with her statin and has been taking it approximately twice a week. She doesn't have side effects.  07/02/2019 -patient is coming after 3 years, she has been dealing mostly with her orthopedic problems, her right hip got replaced and infected postop, she Now Has One Leg Longer Than the Other Prohibiting Her from regular exercise.  She denies any chest pain or shortness of breath, she can elect things of daily living and has no symptoms.  She has been tolerating her medications well.  She recently had COVID-19 infection but she has recovered from it.  Past Medical History:  Diagnosis Date  . Arthritis    hips-Right worse and right knee.   . Facial basal cell cancer    nose (Dr. Ubaldo Glassing), leg  . Herpes simplex labialis   . Hyperlipidemia   . Melanoma ([place])    right shoulder- 10 yrs ago- no further issues  . SCC (squamous cell carcinoma) 11/16/2016   left leg, 12/2018 RLE  . Squamous cell skin cancer    many sites (hands/legs)    Past Surgical History:  Procedure Laterality Date  . APPENDECTOMY    . CARDIOVASCULAR STRESS TEST  06-2005   Dr. Rex Kras  . CESAREAN SECTION  x2  . MOHS SURGERY Left 07/10/2017   LLE for SCC, Dr. Sarajane Jews  . REFRACTIVE SURGERY  age 46  . REFRACTIVE SURGERY    . TOTAL HIP ARTHROPLASTY Right 01/19/2015   Procedure: TOTAL RIGHT HIP ARTHROPLASTY ANTERIOR APPROACH;  Surgeon: Paralee Cancel, MD;  Location: [facility];  Service: Orthopedics;  Laterality: Right;    Current Medications: Current Meds  Medication Sig  . acetaminophen (TYLENOL) 500 MG tablet Take 500 mg by mouth every 6 (six) hours as needed.  Marland Kitchen albuterol (VENTOLIN HFA) 108 (90 Base) MCG/ACT inhaler Inhale 2 puffs into the lungs every 6 (six) hours as needed for wheezing or shortness of breath.  . benzonatate (TESSALON) 200 MG capsule TAKE 1 CAPSULE BY MOUTH THREE TIMES A DAY AS NEEDED  . cetirizine (ZYRTEC) 10 MG tablet Take 10 mg by mouth daily as needed  for allergies.   . cholecalciferol (VITAMIN D) 1000 units tablet Take 1,000 Units by mouth daily.  . cyanocobalamin 1000 MCG tablet Take 1,000 mcg by mouth daily.  . Garlic 301 MG CAPS Take 1 capsule by mouth daily.  Marland Kitchen guaiFENesin (MUCINEX) 600 MG 12 hr tablet Take 600 mg by mouth 2 (two) times daily as needed.   . Omega-3 Fatty Acids (FISH OIL) 1000 MG CAPS Take 1 capsule by mouth daily.  . rosuvastatin (CRESTOR) 10 MG tablet Take 1 tablet (10 mg total) by mouth daily.  . valACYclovir (VALTREX) 1000 MG tablet TAKE 2 TABLETS AT ONSET OF COLD SORE FOLLOWED BY SECOND DOSE 12 HOURS LATER. 4 TABS PER BREAKOUT     Allergies:   Morphine and related   Social History   Socioeconomic History   . Marital status: Divorced    Spouse name: Not on file  . Number of children: 2  . Years of education: Not on file  . Highest education level: Not on file  Occupational History  . 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 Smoking status: Never Smoker  . Smokeless tobacco: Never Used  Substance and Sexual Activity  . Alcohol use: Yes    Comment: 1 drink every 6 months.  . Drug use: No  . Sexual activity: Not Currently    Partners: Male  Other Topics Concern  . Not on file  Social History Narrative   Lives alone. Both sons live in [place]. 2 grandchildren, and 2 great-grandchildren who she watches them 2 days/week and on weekends. She is "semi-retired".   Social Determinants of Health   Financial Resource Strain:   . Difficulty of Paying Living Expenses:   Food Insecurity:   . Worried About Charity fundraiser in the Last Year:   . Arboriculturist in the Last Year:   Transportation Needs:   . Film/video editor (Medical):   Marland Kitchen Lack of Transportation (Non-Medical):   Physical Activity:   . Days of Exercise per Week:   . Minutes of Exercise per Session:   Stress:   . Feeling of Stress :   Social Connections:   . Frequency of Communication with Friends and Family:   . Frequency of Social Gatherings with Friends and Family:   . Attends Religious Services:   . Active Member of Clubs or Organizations:   . Attends Archivist Meetings:   Marland Kitchen Marital Status:      Family History: The patient's family history includes Alcohol abuse in her brother and father; Arthritis in her sister; COPD in her brother, brother, mother, and sister; Diabetes in her brother, sister, sister, and sister; Gout in her sister; Berenice Primas' disease in her sister; Heart disease in her brother, brother, sister, sister, sister, and sister; Heart disease (age of onset: 66) in her mother; Hyperlipidemia in her mother, sister, and sister; Hypertension in her mother,  sister, and sister; Stroke (age of onset: 30) in her father.  ROS:   Please see the history of present illness.    All other systems reviewed and are negative.  EKGs/Labs/Other Studies Reviewed:    The following studies were reviewed today:  EKG:  EKG is ordered today.  The ekg ordered today demonstrates normal sinus rhythm, normal EKG, unchanged from prior.  Recent Labs: No results found for requested labs within last 8760 hours.  Recent Lipid Panel    Component Value Date/Time   CHOL  223 (H) 05/30/2018 0830   TRIG 107 05/30/2018 0830   HDL 75 05/30/2018 0830   CHOLHDL 3.0 05/30/2018 0830   CHOLHDL 2.1 01/11/2017 1003   VLDL 31 (H) 01/06/2016 0855   LDLCALC 127 (H) 05/30/2018 0830   LDLCALC 100 (H) 01/11/2017 1003    Physical Exam:    VS:  BP 120/78   Pulse 85   Ht 5' (1.524 m)   Wt 193 lb 9.6 oz (87.8 kg)   SpO2 94%   BMI 37.81 kg/m     Wt Readings from Last 3 Encounters:  07/02/19 193 lb 9.6 oz (87.8 kg)  07/31/18 195 lb 6.4 oz (88.6 kg)  06/03/18 190 lb 9.6 oz (86.5 kg)     GEN:  Well nourished, well developed in no acute distress HEENT: Normal NECK: No JVD; No carotid bruits LYMPHATICS: No lymphadenopathy CARDIAC: RRR, no murmurs, rubs, gallops RESPIRATORY:  Clear to auscultation without rales, wheezing or rhonchi  ABDOMEN: Soft, non-tender, non-distended MUSCULOSKELETAL:  No edema; No deformity  SKIN: Warm and dry NEUROLOGIC:  Alert and oriented x 3 PSYCHIATRIC:  Normal affect   ASSESSMENT:    1. Essential hypertension   2. Pure hypercholesterolemia   3. Family history of early CAD    PLAN:    In order of problems listed above:  1.  Significant family history of premature coronary artery disease in 6 siblings including sudden cardiac death, a Lexiscan nuclear stress test  in 2016 showed no prior infarct and no ischemia. We'll continue aggressive medical management she is instructed to take her Crestor every day and is possible.  She is advised to  start exercising on a regular basis however she is able to do it with her orthopedic limitations.  2. Hyperlipidemia -we will recheck today.  Continue Crestor.  3. Essential hypertension - well controlled today.  Follow-up in 1 year.  Medication Adjustments/Labs and Tests Ordered: Current medicines are reviewed at length with the patient today.  Concerns regarding medicines are outlined above.  Orders Placed This Encounter  Procedures  . CBC  . Comp Met (CMET)  . Lipid Profile  . TSH  . EKG 12-Lead   Meds ordered this encounter  Medications  . aspirin EC 81 MG tablet    Sig: Take 1 tablet (81 mg total) by mouth daily.    Dispense:  90 tablet    Refill:  3    Patient Instructions  Medication Instructions:   START TAKING ASPIRIN 81 MG BY MOUTH DAILY  *If you need a refill on your cardiac medications before your next appointment, please call your pharmacy*   Lab Work:  TODAY--CMET, CBC, TSH, AND LIPIDS  If you have labs (blood work) drawn today and your tests are completely normal, you will receive your results only by: Marland Kitchen MyChart Message (if you have MyChart) OR . A paper copy in the mail If you have any lab test that is abnormal or we need to change your treatment, we will call you to review the results.  Follow-Up: At [facility], you and your health needs are our priority.  As part of our continuing mission to provide you with exceptional heart care, we have created designated Provider Care Teams.  These Care Teams include your primary Cardiologist (physician) and Advanced Practice Providers (APPs -  Physician Assistants and Nurse Practitioners) who all work together to provide you with the care you need, when you need it.  We recommend signing up for the patient portal called "  MyChart".  Sign up information is provided on this After Visit Summary.  MyChart is used to connect with patients for Virtual Visits (Telemedicine).  Patients are able to view lab/test  results, encounter notes, upcoming appointments, etc.  Non-urgent messages can be sent to your provider as well.   To learn more about what you can do with MyChart, go to [email or web address].    Your next appointment:   12 month(s)  The format for your next appointment:   In Person  Provider:   Ena Dawley, MD      Signed, Ena Dawley, MD  07/02/2019 11:27 AM    [place]

## 2019-07-02 NOTE — Patient Instructions (Signed)
Medication Instructions:   START TAKING ASPIRIN 81 MG BY MOUTH DAILY  *If you need a refill on your cardiac medications before your next appointment, please call your pharmacy*   Lab Work:  TODAY--CMET, CBC, TSH, AND LIPIDS  If you have labs (blood work) drawn today and your tests are completely normal, you will receive your results only by: Marland Kitchen MyChart Message (if you have MyChart) OR . A paper copy in the mail If you have any lab test that is abnormal or we need to change your treatment, we will call you to review the results.  Follow-Up: At [facility], you and your health needs are our priority.  As part of our continuing mission to provide you with exceptional heart care, we have created designated Provider Care Teams.  These Care Teams include your primary Cardiologist (physician) and Advanced Practice Providers (APPs -  Physician Assistants and Nurse Practitioners) who all work together to provide you with the care you need, when you need it.  We recommend signing up for the patient portal called "MyChart".  Sign up information is provided on this After Visit Summary.  MyChart is used to connect with patients for Virtual Visits (Telemedicine).  Patients are able to view lab/test results, encounter notes, upcoming appointments, etc.  Non-urgent messages can be sent to your provider as well.   To learn more about what you can do with MyChart, go to [email or web address].    Your next appointment:   12 month(s)  The format for your next appointment:   In Person  Provider:   Ena Dawley, MD

## 2019-07-03 LAB — CBC
Hematocrit: 42 % (ref 34.0–46.6)
Hemoglobin: 13.8 g/dL (ref 11.1–15.9)
MCH: 29 pg (ref 26.6–33.0)
MCHC: 32.9 g/dL (ref 31.5–35.7)
MCV: 88 fL (ref 79–97)
Platelets: 521 10*3/uL — ABNORMAL HIGH (ref 150–450)
RBC: 4.76 x10E6/uL (ref 3.77–5.28)
RDW: 13.3 % (ref 11.7–15.4)
WBC: 4.8 10*3/uL (ref 3.4–10.8)

## 2019-07-03 LAB — LIPID PANEL
Chol/HDL Ratio: 3.4 ratio (ref 0.0–4.4)
Cholesterol, Total: 184 mg/dL (ref 100–199)
HDL: 54 mg/dL (ref >39)
LDL Chol Calc (NIH): 107 mg/dL — ABNORMAL HIGH (ref 0–99)
Triglycerides: 133 mg/dL (ref 0–149)
VLDL Cholesterol Cal: 23 mg/dL (ref 5–40)

## 2019-07-03 LAB — COMPREHENSIVE METABOLIC PANEL
ALT: 34 IU/L — ABNORMAL HIGH (ref 0–32)
AST: 15 IU/L (ref 0–40)
Albumin/Globulin Ratio: 1.6 (ref 1.2–2.2)
Albumin: 4.3 g/dL (ref 3.8–4.8)
Alkaline Phosphatase: 63 IU/L (ref 39–117)
BUN/Creatinine Ratio: 15 (ref 12–28)
BUN: 11 mg/dL (ref 8–27)
Bilirubin Total: 0.4 mg/dL (ref 0.0–1.2)
CO2: 22 mmol/L (ref 20–29)
Calcium: 9.4 mg/dL (ref 8.7–10.3)
Chloride: 105 mmol/L (ref 96–106)
Creatinine, Ser: 0.73 mg/dL (ref 0.57–1.00)
GFR calc Af Amer: 97 mL/min/{1.73_m2} (ref >59)
GFR calc non Af Amer: 84 mL/min/{1.73_m2} (ref >59)
Globulin, Total: 2.7 g/dL (ref 1.5–4.5)
Glucose: 96 mg/dL (ref 65–99)
Potassium: 4.9 mmol/L (ref 3.5–5.2)
Sodium: 141 mmol/L (ref 134–144)
Total Protein: 7 g/dL (ref 6.0–8.5)

## 2019-07-03 LAB — TSH: TSH: 0.946 u[IU]/mL (ref 0.450–4.500)

## 2019-07-05 ENCOUNTER — Other Ambulatory Visit: Payer: Self-pay | Admitting: Family Medicine

## 2019-07-07 NOTE — Telephone Encounter (Signed)
Is this okay to refill. Pt has upcoming appt in june

## 2019-07-16 ENCOUNTER — Other Ambulatory Visit: Payer: Self-pay | Admitting: Family Medicine

## 2019-07-16 DIAGNOSIS — E7800 Pure hypercholesterolemia, unspecified: Secondary | ICD-10-CM

## 2019-07-16 DIAGNOSIS — Z8249 Family history of ischemic heart disease and other diseases of the circulatory system: Secondary | ICD-10-CM

## 2019-08-09 NOTE — Progress Notes (Signed)
Chief Complaint  Patient presents with   Medicare Wellness    nonfasting awv with pelvic. Did not get Tdap. Hip still bothering her, right.    Lauren [name] is a 69 y.o. female who presents for annual physical exam, Medicare wellness visit and follow-up on chronic medical conditions.   She has the following concerns:  R hip pain.  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 She admits she hasn't seen ortho in over a year (never called after we discussed this last year).  Pain increases when she has to walk a lot.    She had COVID in April for 2 week, completely recovered.  She had some lingering headaches and a morning cough for an additional 2 weeks, but this eventually resolved. She is considering getting her vaccine in July.  Hyperlipidemia/family history of CAD: Shesaw Dr. Nelson/cardiology recently for follow-up (originally sent for cardiac clearance prior to hip replacement). She had a normal stress test, and was started on Crestor by Dr. Meda Coffee.  Previously admitted to missing a dose 2-3 times/week.  She didn't take any when she had COVID for 2 weeks, but reports being compliant now, no missed doses. She tolerates statin without side effects (as long as she takes it at night, had some dizziness when she wasn't).  She eats very few eggs, infrequentice cream, doesn't eat cheese. She no longereats red meatas often, maybe just twice a month (mostly in spaghetti sauce or lasagna). She had labs done by Dr. Meda Coffee last month, see below for results.  No changes were made.  Herpes Labialis--Uses Valtrex prn, refilled last month.  They tend to be triggered by the sun (and stress), hasn't been out as much. Only had 2 flares in the last year.    Immunization History  Administered Date(s) Administered   Influenza Split 11/24/2010   Influenza, High Dose Seasonal PF 10/29/2014, 12/23/2015, 01/11/2017, 12/10/2017   Influenza, Quadrivalent, Recombinant,  Inj, Pf 01/09/2019   Influenza,inj,Quad PF,6+ Mos 10/27/2013   Pneumococcal Conjugate-13 10/29/2014   Pneumococcal Polysaccharide-23 10/05/2008, 01/06/2016   Td 02/28/2000   Tdap 10/05/2008   Zoster 11/24/2010   Zoster Recombinat (Shingrix) 10/04/2017, 12/17/2017   Last Pap smear:12/2016, no high risk HPV detected Last mammogram:08/2018 Last colonoscopy:07/2016 (scattered diverticula, Dr. Collene Mares) Last DEXA:08/2018 T-0.9 Teola Bradley) Dentist:twice yearly. Ophtho:3 years ago. Exercise:walks a lot, not fast, usually 30 minutes, twice a week. Also works a lot related to work. No regular weight-bearing exercise.    Other doctors caring for patient include: Dentist: [facility] in [place] Dermatologist: Dr. Rolm Bookbinder  Cardiologist: Dr.Nelson GI: Dr. Collene Mares Ortho: Dr. Alvan Dame Ophtho: Dr. Gershon Crane  Depressionscreen: negative Fall screen: negative Functional Status Survey: Notable for slight trouble hearing (certain tones, or certain people who mumble (when they aren't wearing their dentures), worse since people wear masks), leakage of urine (if picks up something heavy) Mini-Cog screen: normal   End of Life Discussion: Patient hasa living will and medical power of attorney   PMH, PSH, [facility] and FH were reviewed and updated.  Outpatient Encounter Medications as of 08/11/2019  Medication Sig Note   aspirin EC 81 MG tablet Take 1 tablet (81 mg total) by mouth daily.    cholecalciferol (VITAMIN D) 1000 units tablet Take 1,000 Units by mouth daily.    cyanocobalamin 1000 MCG tablet Take 1,000 mcg by mouth daily.    Garlic 025 MG CAPS Take 1 capsule by mouth daily.    rosuvastatin (CRESTOR) 10 MG  tablet TAKE 1 TABLET BY MOUTH EVERY DAY    acetaminophen (TYLENOL) 500 MG tablet Take 500 mg by mouth every 6 (six) hours as needed. (Patient not taking: Reported on 08/11/2019)    cetirizine (ZYRTEC) 10 MG tablet Take 10 mg by mouth daily as needed for allergies.   (Patient not taking: Reported on 08/11/2019)    Omega-3 Fatty Acids (FISH OIL) 1000 MG CAPS Take 1 capsule by mouth daily. (Patient not taking: Reported on 08/11/2019) 08/11/2019: Ran out   valACYclovir (VALTREX) 1000 MG tablet TAKE 2 TABLETS AT ONSET OF COLD SORE FOLLOWED BY SECOND DOSE 12 HOURS LATER. 4 TABS PER BREAKOUT (Patient not taking: Reported on 08/11/2019)    [DISCONTINUED] albuterol (VENTOLIN HFA) 108 (90 Base) MCG/ACT inhaler Inhale 2 puffs into the lungs every 6 (six) hours as needed for wheezing or shortness of breath.    [DISCONTINUED] benzonatate (TESSALON) 200 MG capsule TAKE 1 CAPSULE BY MOUTH THREE TIMES A DAY AS NEEDED    [DISCONTINUED] guaiFENesin (MUCINEX) 600 MG 12 hr tablet Take 600 mg by mouth 2 (two) times daily as needed.     No facility-administered encounter medications on file as of 08/11/2019.   Allergies  Allergen Reactions   Morphine And Related     Patient prefers not to received"became addictive to this many years ago"- Demerol is okay    ROS: The patient denies anorexia, fever, headaches, vision changes, decreased hearing, ear pain, sore throat, breast concerns, chest pain, palpitations, dizziness, syncope, dyspnea on exertion, cough, swelling, nausea, vomiting, diarrhea, constipation, abdominal pain, melena, hematochezia, indigestion/heartburn, hematuria, dysuria, vaginal bleeding, discharge, odor or itch, genital lesions, numbness, tingling, weakness, tremor, suspicious skin lesions (sees derm yearly), depression, anxiety, abnormal bleeding/bruising, or enlarged lymph nodes.  Some leakage of urine with sneeze/cough, lifting heavy object,only with full bladder. Right lateral hip pain   PHYSICAL EXAM:  BP 130/80    Pulse 76    Temp (!) 97.1 F (36.2 C) (Temporal)    Ht 4\' 11"  (1.499 m)    Wt 194 lb 6.4 oz (88.2 kg)    BMI 39.26 kg/m   Wt Readings from Last 3 Encounters:  08/11/19 194 lb 6.4 oz (88.2 kg)  07/02/19 193 lb 9.6 oz (87.8 kg)  07/31/18  195 lb 6.4 oz (88.6 kg)    General Appearance:  Alert, cooperative, no distress, appears stated age   Head:  Normocephalic, without obvious abnormality, atraumatic   Eyes:  PERRL, EOM's intact, fundi benign.   Ears:  Normal TM's and external ear canals   Nose:  Not examined due to coronavirus (wearing mask)   Throat:  Not examined due to coronavirus (wearing mask)    Neck:  Supple, no lymphadenopathy; thyroid: no enlargement/ tenderness/nodules; no carotid bruit or JVD   Back:  Spine nontender, no curvature, ROM normal, no CVA tenderness   Lungs:  Clear to auscultation bilaterally without wheezes, rales or ronchi; respirations unlabored   Chest Wall:  No tenderness or deformity   Heart:  Regular rate and rhythm, S1 and S2 normal, no murmur, rub or gallop   Breast Exam:  No tenderness, masses, or nipple discharge or inversion. No axillary lymphadenopathy.   Abdomen:  Soft, non-tender, nondistended, normoactive bowel sounds, no masses, no hepatosplenomegaly. WHSS  Genitalia:  Normal external genitalia without lesions. BUS and vagina normal; No abnormal vaginal discharge. Uterus and adnexa not enlarged, nontender, no masses. Pap not performed.  Rectal:  Normal tone, no masses or tenderness; guaiac negative stool  Extremities:  No clubbing, cyanosis or edema.WHSS right lateral hip. Tender at right trochanteric bursa, nontender on the left.  Pulses:  2+ and symmetric all extremities   Skin:  Skin color, texture, turgor normal, no lesions. Some bruising and purpura on forearms and lower legs, as well as many scars from biopsies.   Lymph nodes:  Cervical, supraclavicular, and axillary nodes normal   Neurologic:  CNII-XII intact, normal strength, sensation and gait; reflexes 2+ and symmetric throughout   Psych: Normal mood, affect, hygiene and grooming      Chemistry      Component Value Date/Time   NA 141 07/02/2019 1135   K 4.9  07/02/2019 1135   CL 105 07/02/2019 1135   CO2 22 07/02/2019 1135   BUN 11 07/02/2019 1135   CREATININE 0.73 07/02/2019 1135   CREATININE 0.87 01/11/2017 1003      Component Value Date/Time   CALCIUM 9.4 07/02/2019 1135   ALKPHOS 63 07/02/2019 1135   AST 15 07/02/2019 1135   ALT 34 (H) 07/02/2019 1135   BILITOT 0.4 07/02/2019 1135     Fasting glu 96  Lab Results  Component Value Date   WBC 4.8 07/02/2019   HGB 13.8 07/02/2019   HCT 42.0 07/02/2019   MCV 88 07/02/2019   PLT 521 (H) 07/02/2019   Lab Results  Component Value Date   TSH 0.946 07/02/2019   Lab Results  Component Value Date   CHOL 184 07/02/2019   HDL 54 07/02/2019   LDLCALC 107 (H) 07/02/2019   TRIG 133 07/02/2019   CHOLHDL 3.4 07/02/2019     ASSESSMENT/PLAN:  Annual physical exam  Medicare annual wellness visit, subsequent  Family history of early CAD - LDL slightly above goal. Cont ASA, Crestor.   Herpes labialis - continue valtrex prn  Osteopenia, unspecified location - Improvement noted on last DEXA, now normal range. Encouraged her to do weight-bearing exercise  Pure hypercholesterolemia - low cholesterol diet reviewed. Cont Crestor  Elevated platelet count - recheck today.  on ASA, previously normal. If persistently high, will need to arrange for f/u - Plan: CBC with Differential/Platelet  Senile purpura ([place])  Vaccine counseling - counseled re: COVID vaccine and TdaP.  Will wait until July for [place]; rec getting TdaP now from pharmacy  Trochanteric bursitis of right hip - past due for f/u with ortho for hip replacement, encouraged to schedule.  Can return for cortisone shot here vs at ortho  Class 2 severe obesity due to excess calories with serious comorbidity and body mass index (BMI) of 39.0 to 39.9 in adult [facility]) - counseled re: healthy diet, weight loss, risks of obesity.  Complics arthritis, high cholesterol  Elevated plt LDL borderline high--review low chol diet, compliance  with crestor ALT minimally elevated--limit alcohol, weight loss encouraged.   Discussed monthly self breast exams and yearly mammograms; at least 30 minutes of aerobic activity at least 5 days/week, weight-bearing exercise 2x/week; proper sunscreen use reviewed; healthy diet, including goals of calcium and vitamin D intake and alcohol recommendations (less than or equal to 1 drink/day) reviewed; regular seatbelt use; changing batteries in smoke detectors. Immunization recommendations discussed--continueyearly high dose flu shots.Tetanus booster (Td or TdaP) is past due, to get at pharmacy.COVID vaccines recommended. Colonoscopy recommendations reviewed--UTD. Encouraged routine eye exam and f/u with ortho.   Medicare Attestation I have personally reviewed: The patient's medical and social history Their use of alcohol, tobacco or illicit drugs Their current medications and supplements The patient's functional ability including  ADLs,fall risks, home safety risks, cognitive, and hearing and visual impairment Diet and physical activities Evidence for depression or mood disorders  The patient's weight, height, BMI have been recorded in the chart.  I have made referrals, counseling, and provided education to the patient based on review of the above and I have provided the patient with a written personalized care plan for preventive services.

## 2019-08-09 NOTE — Patient Instructions (Addendum)
HEALTH MAINTENANCE RECOMMENDATIONS:  It is recommended that you get at least 30 minutes of aerobic exercise at least 5 days/week (for weight loss, you may need as much as 60-90 minutes). This can be any activity that gets your heart rate up. This can be divided in 10-15 minute intervals if needed, but try and build up your endurance at least once a week.  Weight bearing exercise is also recommended twice weekly.  Eat a healthy diet with lots of vegetables, fruits and fiber.  "Colorful" foods have a lot of vitamins (ie green vegetables, tomatoes, red peppers, etc).  Limit sweet tea, regular sodas and alcoholic beverages, all of which has a lot of calories and sugar.  Up to 1 alcoholic drink daily may be beneficial for women (unless trying to lose weight, watch sugars).  Drink a lot of water.  Calcium recommendations are 1200-1500 mg daily (1500 mg for postmenopausal women or women without ovaries), and vitamin D 1000 IU daily.  This should be obtained from diet and/or supplements (vitamins), and calcium should not be taken all at once, but in divided doses.  Monthly self breast exams and yearly mammograms for women over the age of 39 is recommended.  Sunscreen of at least SPF 30 should be used on all sun-exposed parts of the skin when outside between the hours of 10 am and 4 pm (not just when at beach or pool, but even with exercise, golf, tennis, and yard work!)  Use a sunscreen that says "broad spectrum" so it covers both UVA and UVB rays, and make sure to reapply every 1-2 hours.  Remember to change the batteries in your smoke detectors when changing your clock times in the spring and fall. Carbon monoxide detectors are recommended for your home.  Use your seat belt every time you are in a car, and please drive safely and not be distracted with cell phones and texting while driving.   Lauren [name] , Thank you for taking time to come for your Medicare Wellness Visit. I appreciate your ongoing  commitment to your health goals. Please review the following plan we discussed and let me know if I can assist you in the future.    This is a list of the screening recommended for you and due dates:  Health Maintenance  Topic Date Due  . COVID-19 Vaccine (1) Never done  . Tetanus Vaccine  10/06/2018  . Flu Shot  09/28/2019  . Mammogram  09/12/2019  . Colon Cancer Screening  07/29/2023  . DEXA scan (bone density measurement)  Completed  .  Hepatitis C: One time screening is recommended by Center for Disease Control  (CDC) for  adults born from [birth year] through 1966.   Completed  . Pneumonia vaccines  Completed   Continue yearly high dose flu shots in the Fall. COVID vaccines are recommended (some say to wait 3 months after infection, but it is fine to get sooner, if desired).  Tetanus booster is past due.  You need to get this from the pharmacy (TdaP).    Please schedule a routine eye exam.  Please schedule routine follow-up with your orthopedist. It is recommended due to you having had a hip replacement.  They can also give you a cortisone shot for your bursitis (or you can come back here for that).  Weight loss is recommended.   MyPlate from [place] is an outline of a general healthy diet based on the 2010 Dietary Guidelines for Americans, from the U.S.  Department of Agriculture Scientist, research (physical sciences)). It sets guidelines for how To follow MyPlate recommendations:  Eat a wide variety of fruits and vegetables, grains, and protein foods.  Serve smaller portions and eat less food throughout the day.  Limit portion sizes to avoid overeating.  Enjoy your food.  Get at least 150 minutes of exercise every week. This is about 30 minutes each day, 5 or more days per week. It can be difficult to have every meal look like MyPlate. Think about MyPlate as eating guidelines for an entire day, rather than each individual meal. Fruits and vegetables  Make half of your plate fruits and  vegetables.  Eat many different colors of fruits and vegetables each day.  For a 2,000 calorie daily food plan, eat: ? 2 cups of vegetables every day. ? 2 cups of fruit every day.  1 cup is equal to: ? 1 cup raw or cooked vegetables. ? 1 cup raw fruit. ? 1 medium-sized orange, apple, or banana. ? 1 cup 100% fruit or vegetable juice. ? 2 cups raw leafy greens, such as lettuce, spinach, or kale. ?  cup dried fruit. Grains  One fourth of your plate should be grains.  Make at least half of the grains you eat each day whole grains.  For a 2,000 calorie daily food plan, eat 6 oz of grains every day.  1 oz is equal to: ? 1 slice bread. ? 1 cup cereal. ?  cup cooked rice, cereal, or pasta. Protein  One fourth of your plate should be protein.  Eat a wide variety of protein foods, including meat, poultry, fish, eggs, beans, nuts, and tofu.  For a 2,000 calorie daily food plan, eat 5 oz of protein every day.  1 oz is equal to: ? 1 oz meat, poultry, or fish. ?  cup cooked beans. ? 1 egg. ?  oz nuts or seeds. ? 1 Tbsp peanut butter. Dairy  Drink fat-free or low-fat (1%) milk.  Eat or drink dairy as a side to meals.  For a 2,000 calorie daily food plan, eat or drink 3 cups of dairy every day.  1 cup is equal to: ? 1 cup milk, yogurt, cottage cheese, or soy milk (soy beverage). ? 2 oz processed cheese. ? 1 oz natural cheese. Fats, oils, salt, and sugars  Only small amounts of oils are recommended.  Avoid foods that are high in calories and low in nutritional value (empty calories), like foods high in fat or added sugars.  Choose foods that are low in salt (sodium). Choose foods that have less than 140 milligrams (mg) of sodium per serving.  Drink water instead of sugary drinks. Drink enough water each day to keep your urine pale yellow. Where to find support  Work with your health care provider or a nutrition specialist (dietitian) to develop a customized  eating plan that is right for you.  Download an app (mobile application) to help you track your daily food intake. Where to find more information  Go to [email or web address] for more information. Summary  MyPlate is a general guideline for healthy eating from the USDA. It is based on the 2010 Dietary Guidelines for Americans.  In general, fruits and vegetables should take up  of your plate, grains should take up  of your plate, and protein should take up  of your plate. This information is not intended to replace advice given to you by your health care provider. Make sure you discuss any questions you have with  your health care provider. Document Revised: 07/18/2018 Document Reviewed: 05/15/2016 Elsevier Patient Education  [place].

## 2019-08-11 ENCOUNTER — Encounter: Payer: Self-pay | Admitting: Family Medicine

## 2019-08-11 ENCOUNTER — Other Ambulatory Visit: Payer: Self-pay

## 2019-08-11 ENCOUNTER — Ambulatory Visit (INDEPENDENT_AMBULATORY_CARE_PROVIDER_SITE_OTHER): Payer: Medicare Other | Admitting: Family Medicine

## 2019-08-11 VITALS — BP 130/80 | HR 76 | Temp 97.1°F | Ht 59.0 in | Wt 194.4 lb

## 2019-08-11 DIAGNOSIS — E7800 Pure hypercholesterolemia, unspecified: Secondary | ICD-10-CM | POA: Diagnosis not present

## 2019-08-11 DIAGNOSIS — Z8249 Family history of ischemic heart disease and other diseases of the circulatory system: Secondary | ICD-10-CM

## 2019-08-11 DIAGNOSIS — D692 Other nonthrombocytopenic purpura: Secondary | ICD-10-CM

## 2019-08-11 DIAGNOSIS — Z6839 Body mass index (BMI) 39.0-39.9, adult: Secondary | ICD-10-CM

## 2019-08-11 DIAGNOSIS — B001 Herpesviral vesicular dermatitis: Secondary | ICD-10-CM

## 2019-08-11 DIAGNOSIS — Z0000 Encounter for general adult medical examination without abnormal findings: Secondary | ICD-10-CM | POA: Diagnosis not present

## 2019-08-11 DIAGNOSIS — Z7189 Other specified counseling: Secondary | ICD-10-CM

## 2019-08-11 DIAGNOSIS — Z7185 Encounter for immunization safety counseling: Secondary | ICD-10-CM

## 2019-08-11 DIAGNOSIS — M8580 Other specified disorders of bone density and structure, unspecified site: Secondary | ICD-10-CM

## 2019-08-11 DIAGNOSIS — R7989 Other specified abnormal findings of blood chemistry: Secondary | ICD-10-CM

## 2019-08-11 DIAGNOSIS — M7061 Trochanteric bursitis, right hip: Secondary | ICD-10-CM

## 2019-08-11 LAB — CBC WITH DIFFERENTIAL/PLATELET
Basophils Absolute: 0.1 10*3/uL (ref 0.0–0.2)
Basos: 1 %
EOS (ABSOLUTE): 0.2 10*3/uL (ref 0.0–0.4)
Eos: 3 %
Hematocrit: 43.8 % (ref 34.0–46.6)
Hemoglobin: 14.3 g/dL (ref 11.1–15.9)
Immature Grans (Abs): 0 10*3/uL (ref 0.0–0.1)
Immature Granulocytes: 0 %
Lymphocytes Absolute: 1.7 10*3/uL (ref 0.7–3.1)
Lymphs: 32 %
MCH: 29.2 pg (ref 26.6–33.0)
MCHC: 32.6 g/dL (ref 31.5–35.7)
MCV: 89 fL (ref 79–97)
Monocytes Absolute: 0.5 10*3/uL (ref 0.1–0.9)
Monocytes: 9 %
Neutrophils Absolute: 2.8 10*3/uL (ref 1.4–7.0)
Neutrophils: 55 %
Platelets: 314 10*3/uL (ref 150–450)
RBC: 4.9 x10E6/uL (ref 3.77–5.28)
RDW: 14.2 % (ref 11.7–15.4)
WBC: 5.2 10*3/uL (ref 3.4–10.8)

## 2019-10-08 ENCOUNTER — Other Ambulatory Visit: Payer: Self-pay | Admitting: Family Medicine

## 2019-10-08 DIAGNOSIS — Z8249 Family history of ischemic heart disease and other diseases of the circulatory system: Secondary | ICD-10-CM

## 2019-10-08 DIAGNOSIS — E7800 Pure hypercholesterolemia, unspecified: Secondary | ICD-10-CM

## 2019-10-09 DIAGNOSIS — Z1231 Encounter for screening mammogram for malignant neoplasm of breast: Secondary | ICD-10-CM | POA: Diagnosis not present

## 2019-10-09 LAB — HM MAMMOGRAPHY

## 2019-10-13 ENCOUNTER — Encounter: Payer: Self-pay | Admitting: *Deleted

## 2019-10-14 ENCOUNTER — Encounter: Payer: Self-pay | Admitting: Family Medicine

## 2019-12-03 ENCOUNTER — Telehealth: Payer: Self-pay

## 2019-12-03 NOTE — Telephone Encounter (Signed)
Pt. Called wanting to talk with you, I told her I could send you a message. She said she has the flu has cough, congestion, congestion in chest and throat. I asked her if she was seen somewhere else and tested for the flu, she said she is 69 years old and knows she has the flu. I told her I could schedule her for a virtual apt with you and she asked if I could send you a message back first she wanted something called in for her cough.

## 2019-12-03 NOTE — Telephone Encounter (Signed)
Your response was explained and I tried to get her a virtual and offered testing from her car. She said she was not interested-nevermind. She said she will juts keep taking mucinex DM.

## 2019-12-03 NOTE — Telephone Encounter (Signed)
We cannot call in medications without evaluation. You can advise her to take Mucinex DM for her cough. Please offer virtual visit, and we can do COVID and flu test from car to confirm.  Depending on the date of onset of her symptoms, there are medications to shorten the duration of the flu (need to confirm dx--symptoms overlap with COVID)  Ensure that she stays well hydrated.

## 2020-02-02 ENCOUNTER — Encounter: Payer: Self-pay | Admitting: Family Medicine

## 2020-02-02 ENCOUNTER — Telehealth (INDEPENDENT_AMBULATORY_CARE_PROVIDER_SITE_OTHER): Payer: Medicare Other | Admitting: Family Medicine

## 2020-02-02 ENCOUNTER — Other Ambulatory Visit: Payer: Self-pay

## 2020-02-02 VITALS — Ht 59.0 in | Wt 195.0 lb

## 2020-02-02 DIAGNOSIS — R093 Abnormal sputum: Secondary | ICD-10-CM

## 2020-02-02 DIAGNOSIS — R058 Other specified cough: Secondary | ICD-10-CM

## 2020-02-02 MED ORDER — AZITHROMYCIN 250 MG PO TABS: ORAL_TABLET | ORAL | 0 refills | Status: DC

## 2020-02-02 MED ORDER — BENZONATATE 200 MG PO CAPS: 200.0000 mg | ORAL_CAPSULE | Freq: Three times a day (TID) | ORAL | 0 refills | Status: DC | PRN

## 2020-02-02 NOTE — Patient Instructions (Signed)
Stay well hydrated. Take the zpak as directed. Continue mucinex-DM twice daily (12 hour form). If cough isn't improving, then pick up the tessalon prescription (another cough medication), to use as needed for cough (up to three times daily).  Return for evaluation if persistent or worsening cough, pain with breathing, shortness of breath, fever, or if other new symptoms develop.  I hope you feel better soon!

## 2020-02-02 NOTE — Progress Notes (Signed)
Start time: 4:14 End time: 4:28  Virtual Visit via Telephone Note  I connected with Lauren [name] on 02/02/20 by telephone and verified that I am speaking with the correct person using two identifiers. She denied having access for video visit.  Location: Patient: at work, in her office Provider: office   I discussed the limitations of evaluation and management by telemedicine and the availability of in person appointments. The patient expressed understanding and agreed to proceed.  History of Present Illness:  Chief Complaint  Patient presents with  . Cough    PHONE CALL cough, runny nose. Feels like she has congestion in her lungs. Has been taking mucincex x 3 weeks and it's not helping. No fevers. Took 2 at covid tests (last Tues and Fri) both negative.    She had persistent "congestion in her lungs" for a good month after having COVID back in April. She is worried this is going to happen again, as she is developing congestion.  Her grandson had a cold and was with her 3 weeks ago.  A few days after he left she started sneezing, thought it was allergies.  She used OTC allergy meds x 3-4 days, didn't help.    She never ran a fever, mainly just nasal congestion, runny nose. She since developed a cough.  She has been taking mucinex liquid, took the whole bottle, didn't help.  Then got the mucinex DM 12 hour, and took the full box.  It helped while she was taking it, had some cough at night.  Now that she hasn't taken it x 4 days, she is coughing more during the day. Coughs more when talking a lot. Cough is productive of a yellowish phlegm, mainly in the morning and late at night. Nasal drainage is mostly clear, occasionally yellowish. Denies sinus pain currently (did at first) Denies any chest pain or shortness of breath.  Had 2 negative COVID tests    PMH, PSH, SH reviewed  Outpatient Encounter Medications as of 02/02/2020  Medication Sig  . aspirin EC 81 MG tablet Take 1  tablet (81 mg total) by mouth daily.  . cetirizine (ZYRTEC) 10 MG tablet Take 10 mg by mouth daily as needed for allergies.   . cholecalciferol (VITAMIN D) 1000 units tablet Take 1,000 Units by mouth daily.  . cyanocobalamin 1000 MCG tablet Take 1,000 mcg by mouth daily.  . Garlic 782 MG CAPS Take 1 capsule by mouth daily.  . Omega-3 Fatty Acids (FISH OIL) 1000 MG CAPS Take 1 capsule by mouth daily.   . rosuvastatin (CRESTOR) 10 MG tablet TAKE 1 TABLET BY MOUTH EVERY DAY  . acetaminophen (TYLENOL) 500 MG tablet Take 500 mg by mouth every 6 (six) hours as needed. (Patient not taking: Reported on 08/11/2019)  . azithromycin (ZITHROMAX) 250 MG tablet Take 2 tablets by mouth on first day, then 1 tablet by mouth on days 2 through 5  . benzonatate (TESSALON) 200 MG capsule Take 1 capsule (200 mg total) by mouth 3 (three) times daily as needed for cough.  . valACYclovir (VALTREX) 1000 MG tablet TAKE 2 TABLETS AT ONSET OF COLD SORE FOLLOWED BY SECOND DOSE 12 HOURS LATER. 4 TABS PER BREAKOUT (Patient not taking: Reported on 08/11/2019)   No facility-administered encounter medications on file as of 02/02/2020.   NOT taking zpak or tessalon prior to today's visit  Allergies  Allergen Reactions  . Morphine And Related     Patient prefers not to received"became addictive to this  many years ago"- Demerol is okay   ROS: no fever, chills, dizziness, chest pain, shortness of breath, GI complaints.  +URi symptoms per HPI.   Observations/Objective:  Ht 4\' 11"  (1.499 m)   Wt 195 lb (88.5 kg)   BMI 39.39 kg/m   Patient is alert and oriented.  Only rare, somewhat dry-sounding cough noted during visit. She is speaking easily and in no distress. Exam is limited due to virtual (telephone) nature of the visit. She is in good spirits.   Assessment and Plan:  Cough productive of yellow sputum - illness x 3 weeks, some changes/worsening.  Continue Mucinex DM. Add z-pak to treat bacterial component (bronchitis  vs sinusitis), and tessalon prn  - Plan: azithromycin (ZITHROMAX) 250 MG tablet, benzonatate (TESSALON) 200 MG capsule   Stay well hydrated. Take the zpak as directed. Continue mucinex-DM twice daily (12 hour form). If cough isn't improving, then pick up the tessalon prescription (another cough medication), to use as needed for cough (up to three times daily).  Return for evaluation if persistent or worsening cough, pain with breathing, shortness of breath, fever, or if other new symptoms develop.  I hope you feel better soon!     Follow Up Instructions:    I discussed the assessment and treatment plan with the patient. The patient was provided an opportunity to ask questions and all were answered. The patient agreed with the plan and demonstrated an understanding of the instructions.   The patient was advised to call back or seek an in-person evaluation if the symptoms worsen or if the condition fails to improve as anticipated.  I provided 19 minutes of video face-to-face time during this encounter.  Additional time was spent in chart review and documentation.   Vikki Ports, MD

## 2020-02-03 NOTE — Addendum Note (Signed)
Addended by: Rita Ohara on: 02/03/2020 03:41 PM   Modules accepted: Level of Service

## 2020-02-03 NOTE — Progress Notes (Signed)
Yes, use telephone codes if no video

## 2020-02-17 ENCOUNTER — Other Ambulatory Visit: Payer: Self-pay | Admitting: Family Medicine

## 2020-02-17 DIAGNOSIS — E7800 Pure hypercholesterolemia, unspecified: Secondary | ICD-10-CM

## 2020-02-17 DIAGNOSIS — Z8249 Family history of ischemic heart disease and other diseases of the circulatory system: Secondary | ICD-10-CM

## 2020-03-28 ENCOUNTER — Encounter (HOSPITAL_COMMUNITY): Payer: Self-pay

## 2020-03-28 ENCOUNTER — Ambulatory Visit (HOSPITAL_COMMUNITY)
Admission: EM | Admit: 2020-03-28 | Discharge: 2020-03-28 | Disposition: A | Discharge status: Home / Self Care | Payer: Medicare Other | Attending: Student | Admitting: Student

## 2020-03-28 ENCOUNTER — Other Ambulatory Visit: Payer: Self-pay

## 2020-03-28 DIAGNOSIS — I83891 Varicose veins of right lower extremities with other complications: Secondary | ICD-10-CM

## 2020-03-28 DIAGNOSIS — I83899 Varicose veins of unspecified lower extremities with other complications: Secondary | ICD-10-CM

## 2020-03-28 DIAGNOSIS — S81811A Laceration without foreign body, right lower leg, initial encounter: Secondary | ICD-10-CM

## 2020-03-28 MED ORDER — TRIPLE ANTIBIOTIC 5-400-5000 EX OINT: TOPICAL_OINTMENT | Freq: Two times a day (BID) | CUTANEOUS | 0 refills | Status: DC

## 2020-03-28 MED ORDER — BACITRACIN ZINC 500 UNIT/GM EX OINT
TOPICAL_OINTMENT | CUTANEOUS | Status: AC
  Filled 2020-03-28: qty 0.9

## 2020-03-28 NOTE — Discharge Instructions (Signed)
  Keep the wound clean and dry for the next 24 hours. Leave your bandage on for this time.  After 24 hours, clean the wound once a day. Wash the wound with soap and water. Rinse the wound with water to remove all soap. Pat the wound dry with a clean towel. Do not rub the wound. After you clean the wound, you can put a thin layer of antibiotic ointment on it. This ointment: Helps to prevent infection. Keeps the bandage from sticking to the wound.

## 2020-03-28 NOTE — ED Provider Notes (Signed)
[facility]    CSN: [id number] Arrival date & time: 03/28/20  1600      History   Chief Complaint Chief Complaint  Patient presents with  . Laceration    leg    HPI NHYIRA LEANO is a 70 y.o. female presenting with "laceration" to right posterior calf. States 8 hours ago she was shaving and nicked the area over a varicose vein. Patient states she thinks she nicked an artery, as the area bled substantially until she put continued pressure. She does not take a blood thinner but does take daily aspirin. Denies dizziness. Denies active bleeding. Feeling well otherwise. Denies calf pain.   HPI  Past Medical History:  Diagnosis Date  . Arthritis    hips-Right worse and right knee.  Marland Kitchen COVID-19 05/2019  . Facial basal cell cancer    nose (Dr. Ubaldo Glassing), leg  . Herpes simplex labialis   . Hyperlipidemia   . Melanoma ([place])    right shoulder- 10 yrs ago- no further issues  . SCC (squamous cell carcinoma) 11/16/2016   left leg, 12/2018 RLE  . Squamous cell skin cancer    many sites (hands/legs)    Patient Active Problem List   Diagnosis Date Noted  . Senile purpura ([place]) 08/11/2019  . Impaired fasting glucose 06/03/2018  . Osteopenia of spine 01/06/2016  . 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 Pre-op evaluation 01/06/2015  . HTN (hypertension) 01/06/2015  . Family history of early CAD 10/29/2014  . Pure hypercholesterolemia 11/24/2010  . Herpes labialis 11/24/2010  . Bunion of right foot 03/30/2006    Past Surgical History:  Procedure Laterality Date  . APPENDECTOMY    . CARDIOVASCULAR STRESS TEST  06-2005   Dr. Rex Kras  . CESAREAN SECTION  x2  . MOHS SURGERY Left 07/10/2017   LLE for SCC, Dr. Sarajane Jews  . REFRACTIVE SURGERY  age 62  . REFRACTIVE SURGERY    . TOTAL HIP ARTHROPLASTY Right 01/19/2015   Procedure: TOTAL RIGHT HIP ARTHROPLASTY ANTERIOR APPROACH;  Surgeon: Paralee Cancel, MD;  Location: [facility];  Service: Orthopedics;  Laterality: Right;    OB History     Gravida  4   Para      Term      Preterm      AB  2   Living  2     SAB  2   IAB      Ectopic      Multiple      Live Births               Home Medications    Prior to Admission medications   Medication Sig Start Date End Date Taking? Authorizing Provider  aspirin EC 81 MG tablet Take 1 tablet (81 mg total) by mouth daily. 07/02/19  Yes Dorothy Spark, MD  neomycin-bacitracin-polymyxin (NEOSPORIN) [phone] ointment Apply topically in the morning and at bedtime. 03/28/20  Yes Hazel Sams, PA-C  rosuvastatin (CRESTOR) 10 MG tablet TAKE 1 TABLET BY MOUTH EVERY DAY 02/18/20  Yes Rita Ohara, MD  acetaminophen (TYLENOL) 500 MG tablet Take 500 mg by mouth every 6 (six) hours as needed. Patient not taking: Reported on 08/11/2019    [provider]  azithromycin (ZITHROMAX) 250 MG tablet Take 2 tablets by mouth on first day, then 1 tablet by mouth on days 2 through 5 02/02/20   Rita Ohara, MD  benzonatate (TESSALON) 200 MG capsule Take 1 capsule (200 mg total) by mouth 3 (  three) times daily as needed for cough. 02/02/20   Rita Ohara, MD  cetirizine (ZYRTEC) 10 MG tablet Take 10 mg by mouth daily as needed for allergies.     [provider]  cholecalciferol (VITAMIN D) 1000 units tablet Take 1,000 Units by mouth daily.    [provider]  cyanocobalamin 1000 MCG tablet Take 1,000 mcg by mouth daily.    [provider]  Garlic 161 MG CAPS Take 1 capsule by mouth daily.    [provider]  Omega-3 Fatty Acids (FISH OIL) 1000 MG CAPS Take 1 capsule by mouth daily.     [provider]  valACYclovir (VALTREX) 1000 MG tablet TAKE 2 TABLETS AT ONSET OF COLD SORE FOLLOWED BY SECOND DOSE 12 HOURS LATER. 4 TABS PER BREAKOUT Patient not taking: Reported on 08/11/2019 07/07/19   Rita Ohara, MD    Family History Family History  Problem Relation Age of Onset  . Heart disease Mother 72       MI  . COPD Mother   . Hyperlipidemia  Mother   . Hypertension Mother   . Heart disease Sister   . Breast cancer Sister 38  . Heart disease Sister   . Hyperlipidemia Sister   . Hypertension Sister   . Diabetes Sister   . Graves' disease Sister   . COPD Sister   . Heart disease Brother        32's  . Heart disease Brother   . COPD Brother   . Alcohol abuse Brother   . Diabetes Brother        borderline  . COPD Brother   . Alcohol abuse Father   . Stroke Father 48  . Diabetes Sister   . Heart disease Sister   . Heart disease Sister        CAD, s/p angioplasties  . Diabetes Sister   . Hyperlipidemia Sister   . Hypertension Sister   . Gout Sister   . Arthritis Sister   . Lupus Sister   . Heart disease Son 50       mild heart attack    Social History Social History   Tobacco Use  . Smoking status: Never Smoker  . Smokeless tobacco: Never Used  Vaping Use  . Vaping Use: Never used  Substance Use Topics  . Alcohol use: Yes    Comment: beer or margarita twice a month (2/month)  . Drug use: No     Allergies   Morphine and related   Review of Systems Review of Systems  Skin: Positive for wound.  All other systems reviewed and are negative.    Physical Exam Triage Vital Signs ED Triage Vitals  Enc Vitals Group     BP 03/28/20 1708 (!) 147/75     Pulse Rate 03/28/20 1708 91     Resp 03/28/20 1708 15     Temp 03/28/20 1708 98.7 F (37.1 C)     Temp Source 03/28/20 1708 Oral     SpO2 03/28/20 1708 97 %     Weight --      Height --      Head Circumference --      Peak Flow --      Pain Score 03/28/20 1706 3     Pain Loc --      Pain Edu? --      Excl. in [place]? --    No data found.  Updated Vital Signs BP (!) 147/75 (BP Location: Right  Arm)   Pulse 91   Temp 98.7 F (37.1 C) (Oral)   Resp 15   SpO2 97%   Visual Acuity Right Eye Distance:   Left Eye Distance:   Bilateral Distance:    Right Eye Near:   Left Eye Near:    Bilateral Near:     Physical Exam Vitals reviewed.   Constitutional:      Appearance: Normal appearance.  Skin:    Comments: Tiny 53mm x69mm scab overlying varicose vein. No active bleeding. No surrounding erythema or tenderness.  Negative homan sign  Neurological:     General: No focal deficit present.     Mental Status: She is alert and oriented to person, place, and time.  Psychiatric:        Mood and Affect: Mood normal.        Behavior: Behavior normal.        Thought Content: Thought content normal.        Judgment: Judgment normal.      UC Treatments / Results  Labs (all labs ordered are listed, but only abnormal results are displayed) Labs Reviewed - No data to display  EKG   Radiology No results found.  Procedures Procedures (including critical care time)  Medications Ordered in UC Medications - No data to display  Initial Impression / Assessment and Plan / UC Course  I have reviewed the triage vital signs and the nursing notes.  Pertinent labs & imaging results that were available during my care of the patient were reviewed by me and considered in my medical decision making (see chart for details).     Pressure dressing and antibacterial ointment applied. Wound care instructions as below.  Return precautions- bleeding restarts despite treatment.   Return precautions- chest pain, shortness of breath, new/worsening fevers/chills, confusion, worsening of symptoms despite the above treatment plan, etc.    Final Clinical Impressions(s) / UC Diagnoses   Final diagnoses:  Laceration of right lower extremity, initial encounter     Discharge Instructions      1. Keep the wound clean and dry for the next 24 hours. Leave your bandage on for this time.  2. After 24 hours, clean the wound once a day. ? Wash the wound with soap and water. ? Rinse the wound with water to remove all soap. ? Pat the wound dry with a clean towel. Do not rub the wound. 3. After you clean the wound, you can put a thin layer of  antibiotic ointment on it. This ointment: ? Helps to prevent infection. ? Keeps the bandage from sticking to the wound.     ED Prescriptions    Medication Sig Dispense Auth. Provider   neomycin-bacitracin-polymyxin (NEOSPORIN) [phone] ointment Apply topically in the morning and at bedtime. 50 g Hazel Sams, PA-C     PDMP not reviewed this encounter.   Hazel Sams, PA-C 03/28/20 Doran Heater

## 2020-03-28 NOTE — ED Triage Notes (Signed)
Pt presents with a cut to the right lower extremity leg. Pt states she was shaving her legs this morning and cut herself. She states she has a blood clot where she cut herself. She states blood clot has been there for 4 years. Pt states she takes baby aspirin. She states she has bled non stop. Pt states she has wrapped it with an ace bandage 3 times today. She states every time she unwraps it, it starts bleeding.

## 2020-04-04 ENCOUNTER — Other Ambulatory Visit: Payer: Self-pay | Admitting: Family Medicine

## 2020-04-05 NOTE — Telephone Encounter (Signed)
Is this okay to refill? 

## 2020-07-05 ENCOUNTER — Other Ambulatory Visit: Payer: Self-pay

## 2020-07-05 ENCOUNTER — Encounter: Payer: Self-pay | Admitting: Family Medicine

## 2020-07-05 ENCOUNTER — Telehealth: Payer: Self-pay | Admitting: Family Medicine

## 2020-07-05 ENCOUNTER — Telehealth (INDEPENDENT_AMBULATORY_CARE_PROVIDER_SITE_OTHER): Payer: Medicare Other | Admitting: Family Medicine

## 2020-07-05 VITALS — Temp 99.5°F | Ht 59.0 in | Wt 190.0 lb

## 2020-07-05 DIAGNOSIS — R059 Cough, unspecified: Secondary | ICD-10-CM | POA: Diagnosis not present

## 2020-07-05 DIAGNOSIS — U071 COVID-19: Secondary | ICD-10-CM

## 2020-07-05 MED ORDER — BENZONATATE 200 MG PO CAPS: 200.0000 mg | ORAL_CAPSULE | Freq: Three times a day (TID) | ORAL | 0 refills | Status: DC | PRN

## 2020-07-05 MED ORDER — NIRMATRELVIR/RITONAVIR (PAXLOVID)TABLET: 3.0000 | ORAL_TABLET | Freq: Two times a day (BID) | ORAL | 0 refills | Status: AC

## 2020-07-05 NOTE — Patient Instructions (Signed)
Take the Paxlovid twice daily for 5 days. You may continue the mucinex for cough, and also use tessalon (benzonatate) as needed.  If you develop chest pain, shortness of breath, significant trouble with breathing, persistent fever, or any other new/concerning symptoms, please seek immediate evaluation.  10 Things You Can Do to Manage Your COVID-19 Symptoms at Home If you have possible or confirmed COVID-19: 1. Stay home except to get medical care. 2. Monitor your symptoms carefully. If your symptoms get worse, call your healthcare provider immediately. 3. Get rest and stay hydrated. 4. If you have a medical appointment, call the healthcare provider ahead of time and tell them that you have or may have COVID-19. 5. For medical emergencies, call 911 and notify the dispatch personnel that you have or may have COVID-19. 6. Cover your cough and sneezes with a tissue or use the inside of your elbow. 7. Wash your hands often with soap and water for at least 20 seconds or clean your hands with an alcohol-based hand sanitizer that contains at least 60% alcohol. 8. As much as possible, stay in a specific room and away from other people in your home. Also, you should use a separate bathroom, if available. If you need to be around other people in or outside of the home, wear a mask. 9. Avoid sharing personal items with other people in your household, like dishes, towels, and bedding. 10. Clean all surfaces that are touched often, like counters, tabletops, and doorknobs. Use household cleaning sprays or wipes according to the label instructions. [email or web address] 09/12/2019 This information is not intended to replace advice given to you by your health care provider. Make sure you discuss any questions you have with your health care provider. Document Revised: 12/29/2019 Document Reviewed: 12/29/2019 Elsevier Patient Education  2021 [employer].

## 2020-07-05 NOTE — Telephone Encounter (Signed)
Pt called and said she tested positive for covid and feels really bad. She said last time she was sent to the infusion clinic and she would like to do the same thing this time because it helped a lot . She can be reached at [phone]

## 2020-07-05 NOTE — Telephone Encounter (Signed)
Done

## 2020-07-05 NOTE — Telephone Encounter (Signed)
Schedule a virtual visit 

## 2020-07-05 NOTE — Progress Notes (Signed)
Start time: 12:40 End time: 12:58   Virtual Visit via Telephone Note  I connected with Lauren LECKER on 07/05/20 by telephone and verified that I am speaking with the correct person using two identifiers.  Location: Patient: home Provider: office   I discussed the limitations of evaluation and management by telemedicine and the availability of in person appointments. The patient expressed understanding and agreed to proceed.  History of Present Illness:  Chief Complaint  Patient presents with  . Covid Positive    PHONE CALL positive covid test on Saturday. Symptoms started Friday am when she woke up with a HA. Saturday afternoon around 3pm she developed a fever. Is wanting a referral to the infusion clinic (did this when she had covid last year).    5/5 she woke up with a headache.  The next day headache progressively got worse, then developed fever, chills, congestion.  Saturday night she had + COVID test. Cough is nonproductive.  Nasal drainage discolored.  No known COVID exposures, suspects she was exposed through her work (many people coming through the office, not wearing masks).  Using Mucinex and a nose spray (couldn't find while on phone, doesn't know the name).  Hasn't taken any of her medications in the last day or two.  She is having some shortness of breath only if moving around a lot, not with normal activities. Denies chest pain. No nausea, vomiting or diarrhea.  Some nausea 2 nights ago, none since. No bleeding, bruising, rashes. +body aches No loss of smell or taste.  PMH ,PSH, SH reviewed  Outpatient Encounter Medications as of 07/05/2020  Medication Sig Note  . benzonatate (TESSALON) 200 MG capsule Take 1 capsule (200 mg total) by mouth 3 (three) times daily as needed for cough.   . dextromethorphan-guaiFENesin (MUCINEX DM) 30-600 MG 12hr tablet Take 1 tablet by mouth 2 (two) times daily. 07/05/2020: Last dose 9am  . ibuprofen (ADVIL) 200 MG tablet Take 400  mg by mouth every 6 (six) hours as needed. 07/05/2020: Last dose 9am  . nirmatrelvir/ritonavir EUA (PAXLOVID) TABS Take 3 tablets by mouth 2 (two) times daily for 5 days. Patient GFR is >60 (84 in 06/2019) Take nirmatrelvir (150 mg) 2 tablet(s) twice daily for 5 days and ritonavir (100 mg) one tablet twice daily for 5 days.   Marland Kitchen acetaminophen (TYLENOL) 500 MG tablet Take 500 mg by mouth every 6 (six) hours as needed. (Patient not taking: No sig reported)   . aspirin EC 81 MG tablet Take 1 tablet (81 mg total) by mouth daily. (Patient not taking: Reported on 07/05/2020)   . cetirizine (ZYRTEC) 10 MG tablet Take 10 mg by mouth daily as needed for allergies.  (Patient not taking: Reported on 07/05/2020)   . cholecalciferol (VITAMIN D) 1000 units tablet Take 1,000 Units by mouth daily. (Patient not taking: Reported on 07/05/2020)   . cyanocobalamin 1000 MCG tablet Take 1,000 mcg by mouth daily. (Patient not taking: Reported on 07/05/2020)   . Garlic 193 MG CAPS Take 1 capsule by mouth daily. (Patient not taking: Reported on 07/05/2020)   . neomycin-bacitracin-polymyxin (NEOSPORIN) [phone] ointment Apply topically in the morning and at bedtime. (Patient not taking: Reported on 07/05/2020)   . Omega-3 Fatty Acids (FISH OIL) 1000 MG CAPS Take 1 capsule by mouth daily.  (Patient not taking: Reported on 07/05/2020)   . rosuvastatin (CRESTOR) 10 MG tablet TAKE 1 TABLET BY MOUTH EVERY DAY (Patient not taking: Reported on 07/05/2020)   . valACYclovir (VALTREX)  1000 MG tablet TAKE 2 TABLETS AT ONSET OF COLD SORE FOLLOWED BY SECOND DOSE 12 HOURS LATER. 4 TABS PER BREAKOUT (Patient not taking: Reported on 07/05/2020)   . [DISCONTINUED] azithromycin (ZITHROMAX) 250 MG tablet Take 2 tablets by mouth on first day, then 1 tablet by mouth on days 2 through 5   . [DISCONTINUED] benzonatate (TESSALON) 200 MG capsule Take 1 capsule (200 mg total) by mouth 3 (three) times daily as needed for cough.    No facility-administered encounter  medications on file as of 07/05/2020.   NOT taking tessalon or Paxlovid prior to today's visit  Allergies  Allergen Reactions  . Morphine And Related     Patient prefers not to received"became addictive to this many years ago"- Demerol is okay    ROS:  See HPI.    Observations/Objective:  Temp 99.5 F (37.5 C) (Temporal)   Ht 4\' 11"  (1.499 m)   Wt 190 lb (86.2 kg)   BMI 38.38 kg/m   Pleasant female, who sounds congested, with frequent dry/hacky coughing during visit. She is speaking fairly easily, in full sentences, doesn't sound short of breath or in any distress She is alert, oriented. Exam is limited due to virtual nature of the visit.   Assessment and Plan:  COVID-19 - Plan: nirmatrelvir/ritonavir EUA (PAXLOVID) TABS  Cough - Plan: benzonatate (TESSALON) 200 MG capsule  Advised of s/sx for which she should seek immediate care in ER   Follow Up Instructions:    I discussed the assessment and treatment plan with the patient. The patient was provided an opportunity to ask questions and all were answered. The patient agreed with the plan and demonstrated an understanding of the instructions.   The patient was advised to call back or seek an in-person evaluation if the symptoms worsen or if the condition fails to improve as anticipated.  I spent 20 minutes dedicated to the care of this patient, including pre-visit review of records, face to face time, post-visit ordering of testing and documentation.   Vikki Ports, MD

## 2020-08-10 ENCOUNTER — Other Ambulatory Visit: Payer: Self-pay | Admitting: Family Medicine

## 2020-08-10 DIAGNOSIS — E7800 Pure hypercholesterolemia, unspecified: Secondary | ICD-10-CM

## 2020-08-10 DIAGNOSIS — Z8249 Family history of ischemic heart disease and other diseases of the circulatory system: Secondary | ICD-10-CM

## 2020-08-11 NOTE — Progress Notes (Deleted)
Chief Complaint  Patient presents with   Annual Exam    MWV no other issues    Lauren [name] is a 70 y.o. female who presents for annual physical exam, Medicare wellness visit and follow-up on chronic medical conditions.    She reports some depression Sister was in ICU for 8 days, will need heart surgery (now is home). Big stress is her granddaughter--drugs. She doesn't want to talk to anybody or any medications. "I can deal with it"  She had COVID-19 a month ago (second bout--first was in 05/2019).  She was treated with Paxlovid. Cough lingered a while, but now has resolved.  R hip pain.  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 She reports her hip pain is getting worse, has been complaining about worsening pain for the last couple of years.  She has NOT seen orthopedist in at least 2-3 years (despite worsening pain).  Pain is worse with walking.  No pain at rest.  Hyperlipidemia/family history of CAD: She saw Dr. Nelson/cardiology--originally sent for cardiac clearance prior to hip replacement.  She had a normal stress test, and was started on Crestor by Dr. Meda Coffee. She tolerates statin without side effects (as long as she takes it at night, had some dizziness when she wasn't).  She eats very few eggs (2/month), infrequent ice cream, doesn't eat cheese. She no longer eats red meat as often, maybe just once or twice a month (mostly in spaghetti sauce or lasagna). She is due for recheck. Lab Results  Component Value Date   CHOL 184 07/02/2019   HDL 54 07/02/2019   LDLCALC 107 (H) 07/02/2019   TRIG 133 07/02/2019   CHOLHDL 3.4 07/02/2019   Herpes Labialis--Uses Valtrex prn.  They tend to be triggered by the sun (and stress).  Denies any recent outbreak, despite stress and a recent trip to the beach.  Stopped vitamin D supplement about a year ago, recalls being told she didn't need it (then corrected herself that was likely iron).  Last level was  normal, but no checked since 2012.  Immunization History  Administered Date(s) Administered   Influenza Split 11/24/2010   Influenza, High Dose Seasonal PF 10/29/2014, 12/23/2015, 01/11/2017, 12/10/2017   Influenza, Quadrivalent, Recombinant, Inj, Pf 01/09/2019   Influenza,inj,Quad PF,6+ Mos 10/27/2013   Pneumococcal Conjugate-13 10/29/2014   Pneumococcal Polysaccharide-23 10/05/2008, 01/06/2016   Td 02/28/2000   Tdap 10/05/2008   Zoster Recombinat (Shingrix) 10/04/2017, 12/17/2017   Zoster, Live 11/24/2010  She reports having 2 Moderna vaccines, no booster. Had COVID vaccines, no booster Didn't get Tdap from pharmacy yet (advised to last year) Last Pap smear: 12/2016, no high risk HPV detected Last mammogram: 09/2019 Last colonoscopy: 07/2016 (scattered diverticula, Dr. Collene Mares) Last DEXA:08/2018 T-0.9 Teola Bradley) Dentist: twice yearly. Ophtho: 4 years, plans to go Exercise:  Not walking outside much recently (too many family members sick, plus heat). 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  No regular weight-bearing exercise.    Other doctors caring for patient include: Dentist: [facility] in [place] Dermatologist: Dr. Rolm Bookbinder (hasn't gone in the last year) Cardiologist: Dr. Meda Coffee (moved away) GI: Dr. Collene Mares Ortho: Dr. Alvan Dame Ophtho: Dr. Gershon Crane   Depression screen: PHQ-2 score of 2, PHQ=9 score of 5 Fall screen: negative Functional Status Survey:  Notable for slight trouble hearing (certain tones, or certain people who mumble (when they aren't wearing their dentures), worse since people wear masks), leakage of urine (if picks up something heavy),  can't hold her urine quite as long, or has urge incontinence; sometimes has trouble with stairs, related to her hip. Mini-Cog screen: normal    End of Life Discussion:  Patient has a living will and medical power of attorney   PMH, PSH, SH and FH were reviewed and updated.  Outpatient Encounter Medications as of 08/12/2020   Medication Sig Note   aspirin EC 81 MG tablet Take 1 tablet (81 mg total) by mouth daily.    cyanocobalamin 1000 MCG tablet Take 1,000 mcg by mouth daily.    Omega-3 Fatty Acids (FISH OIL) 1000 MG CAPS Take 1 capsule by mouth daily.    rosuvastatin (CRESTOR) 10 MG tablet TAKE 1 TABLET BY MOUTH EVERY DAY    acetaminophen (TYLENOL) 500 MG tablet Take 500 mg by mouth every 6 (six) hours as needed. (Patient not taking: No sig reported)    benzonatate (TESSALON) 200 MG capsule Take 1 capsule (200 mg total) by mouth 3 (three) times daily as needed for cough. (Patient not taking: Reported on 08/12/2020)    cetirizine (ZYRTEC) 10 MG tablet Take 10 mg by mouth daily as needed for allergies.  (Patient not taking: No sig reported)    cholecalciferol (VITAMIN D) 1000 units tablet Take 1,000 Units by mouth daily. (Patient not taking: No sig reported) 08/12/2020: Stopped taking about a year ago   dextromethorphan-guaiFENesin (MUCINEX DM) 30-600 MG 12hr tablet Take 1 tablet by mouth 2 (two) times daily. (Patient not taking: Reported on 08/12/2020) 07/05/2020: Last dose 9am   Garlic 883 MG CAPS Take 1 capsule by mouth daily. (Patient not taking: No sig reported) 08/12/2020: Ran out recently   ibuprofen (ADVIL) 200 MG tablet Take 400 mg by mouth every 6 (six) hours as needed. (Patient not taking: Reported on 08/12/2020)    neomycin-bacitracin-polymyxin (NEOSPORIN) [phone] ointment Apply topically in the morning and at bedtime. (Patient not taking: No sig reported)    valACYclovir (VALTREX) 1000 MG tablet TAKE 2 TABLETS AT ONSET OF COLD SORE FOLLOWED BY SECOND DOSE 12 HOURS LATER. 4 TABS PER BREAKOUT (Patient not taking: No sig reported)    No facility-administered encounter medications on file as of 08/12/2020.   Allergies  Allergen Reactions   Morphine And Related     Patient prefers not to received"became addictive to this many years ago"- Demerol is okay    ROS: The patient denies anorexia, fever, headaches,  vision changes, decreased hearing, ear pain, sore throat, breast concerns, chest pain, palpitations, dizziness, syncope, dyspnea on exertion, cough, swelling, nausea, vomiting, diarrhea, constipation, abdominal pain, melena, hematochezia, indigestion/heartburn, hematuria, dysuria, vaginal bleeding, discharge, odor or itch, genital lesions, numbness, tingling, weakness, tremor, suspicious skin lesions (sees derm yearly), depression, anxiety, abnormal bleeding/bruising, or enlarged lymph nodes.   Some leakage of urine with sneeze/cough, lifting heavy object, only with full bladder. Right hip pain. Urinary urgency and incontinence, no dysuria. +stress and mild depression. +fatigue, not sleeping well--wakes up early, mind starts running, can't get back to sleep.    PHYSICAL EXAM:  BP 134/80   Pulse 74   Temp 98.1 F (36.7 C)   Ht 4' 11.5" (1.511 m)   Wt 201 lb 6.4 oz (91.4 kg)   BMI 40.00 kg/m   Wt Readings from Last 3 Encounters:  08/12/20 201 lb 6.4 oz (91.4 kg)  07/05/20 190 lb (86.2 kg)  02/02/20 195 lb (88.5 kg)  07/2019 194# 6.4 oz (Other listed visits were virtual, home weights).   General Appearance:   Alert, cooperative,  no distress, appears stated age    Head:   Normocephalic, without obvious abnormality, atraumatic    Eyes:   PERRL, EOM's intact, fundi benign.    Ears:   Normal TM's and external ear canals    Nose:   Not examined due to coronavirus (wearing mask)    Throat:   Not examined due to coronavirus (wearing mask)      Neck:   Supple, no lymphadenopathy; thyroid: no enlargement/ tenderness/nodules; no carotid bruit or JVD    Back:   Spine nontender, no curvature, ROM normal, no CVA tenderness    Lungs:   Clear to auscultation bilaterally without wheezes, rales or ronchi; respirations unlabored    Chest Wall:   No tenderness or deformity    Heart:   Regular rate and rhythm, S1 and S2 normal, no murmur, rub or gallop    Breast Exam:   No tenderness, masses, or nipple  discharge or inversion. No axillary lymphadenopathy.   Abdomen:   Soft, non-tender, nondistended, normoactive bowel sounds, no masses, no hepatosplenomegaly. WHSS   Genitalia:   Normal external genitalia without lesions. BUS and vagina normal; No abnormal vaginal discharge. Uterus and adnexa not enlarged, nontender, no masses. Pap not performed.  Rectal:   Normal tone, no masses or tenderness; guaiac negative stool    Extremities:   No clubbing, cyanosis or edema. WHSS right lateral hip. Tender at right trochanteric bursa, nontender on the left.  Pulses:   2+ and symmetric all extremities    Skin:   Skin color, texture, turgor normal, no lesions. Some bruising and purpura on forearms and lower legs, as well as many scars from biopsies.   Lymph nodes:   Cervical, supraclavicular, and axillary nodes normal    Neurologic:   Normal strength, sensation and gait; reflexes 2+ and symmetric throughout                          Psych:  Normal mood, affect, hygiene and grooming   Update skin--bruising/purpura on forearms and LE's? Arms  Tender at R troch bursa? Yes    ASSESSMENT/PLAN:  Counseling encouraged No flu shot, covid booster-rec 1-2 mos Past due for tdap Schedule derm, ophtho, ortho   Crestor refill after labs--[facility] C-met, lipids, cbc, TSH (wt gain, fatigue, depression)  Discussed monthly self breast exams and yearly mammograms; at least 30 minutes of aerobic activity at least 5 days/week, weight-bearing exercise 2x/week; proper sunscreen use reviewed; healthy diet, including goals of calcium and vitamin D intake and alcohol recommendations (less than or equal to 1 drink/day) reviewed; regular seatbelt use; changing batteries in smoke detectors. Immunization recommendations discussed--continue yearly high dose flu shots. Tetanus booster (Td or TdaP) is past due, to get at pharmacy. COVID vaccines recommended. Colonoscopy recommendations reviewed--UTD.  Encouraged  routine eye exam and f/u with ortho.    Medicare Attestation I have personally reviewed: The patient's medical and social history Their use of alcohol, tobacco or illicit drugs Their current medications and supplements The patient's functional ability including ADLs,fall risks, home safety risks, cognitive, and hearing and visual impairment Diet and physical activities Evidence for depression or mood disorders  The patient's weight, height, BMI have been recorded in the chart.  I have made referrals, counseling, and provided education to the patient based on review of the above and I have provided the patient with a written personalized care plan for preventive services.

## 2020-08-11 NOTE — Patient Instructions (Addendum)
HEALTH MAINTENANCE RECOMMENDATIONS:  It is recommended that you get at least 30 minutes of aerobic exercise at least 5 days/week (for weight loss, you may need as much as 60-90 minutes). This can be any activity that gets your heart rate up. This can be divided in 10-15 minute intervals if needed, but try and build up your endurance at least once a week.  Weight bearing exercise is also recommended twice weekly.  Eat a healthy diet with lots of vegetables, fruits and fiber.  "Colorful" foods have a lot of vitamins (ie green vegetables, tomatoes, red peppers, etc).  Limit sweet tea, regular sodas and alcoholic beverages, all of which has a lot of calories and sugar.  Up to 1 alcoholic drink daily may be beneficial for women (unless trying to lose weight, watch sugars).  Drink a lot of water.  Calcium recommendations are 1200-1500 mg daily (1500 mg for postmenopausal women or women without ovaries), and vitamin D 1000 IU daily.  This should be obtained from diet and/or supplements (vitamins), and calcium should not be taken all at once, but in divided doses.  Monthly self breast exams and yearly mammograms for women over the age of 56 is recommended.  Sunscreen of at least SPF 30 should be used on all sun-exposed parts of the skin when outside between the hours of 10 am and 4 pm (not just when at beach or pool, but even with exercise, golf, tennis, and yard work!)  Use a sunscreen that says "broad spectrum" so it covers both UVA and UVB rays, and make sure to reapply every 1-2 hours.  Remember to change the batteries in your smoke detectors when changing your clock times in the spring and fall. Carbon monoxide detectors are recommended for your home.  Use your seat belt every time you are in a car, and please drive safely and not be distracted with cell phones and texting while driving.   Lauren [name] , Thank you for taking time to come for your Medicare Wellness Visit. I appreciate your ongoing  commitment to your health goals. Please review the following plan we discussed and let me know if I can assist you in the future.    This is a list of the screening recommended for you and due dates:  Health Maintenance  Topic Date Due   COVID-19 Vaccine (1) Never done   Tetanus Vaccine  10/06/2018   Flu Shot  09/27/2020   Mammogram  10/08/2020   Colon Cancer Screening  07/29/2023   DEXA scan (bone density measurement)  Completed   Hepatitis C Screening: USPSTF Recommendation to screen - Ages 71-79 yo.  Completed   Pneumonia vaccines  Completed   Zoster (Shingles) Vaccine  Completed   HPV Vaccine  Aged Out   Tetanus booster (tdap) is past due, you need to get from pharmacy. Be sure to get your flu shots yearly. Schedule a routine eye exam. Consider getting a COVID booster in the next 1-2 months (either at our office, or from pharmacy).  Please schedule follow-up with the orthopedist, given that you have been complaining of worsening pain in the right hip for at least 2 years now. Please schedule your dermatology visit--they book up far in advance and you are past due.  Resume taking 1000 IU of Vitamin D3.  I highly encourage you to seek counseling to help you deal with your significant stressors. We discussed this today--let me know if you need any asisistance with finding somebody. Please work on  stress reduction and taking care of yourself.  Please try and work on losing weight.  Mindfulness-Based Stress Reduction Mindfulness-based stress reduction (MBSR) is a program that helps people learn to practice mindfulness. Mindfulness is the practice of intentionally paying attention to the present moment. MBSR focuses on developing self-awareness, which allows you to respond to life stress without judgment or negative emotions. It can be learned and practiced through techniques such as education, breathing exercises, meditation, and yoga. MBSR includes several mindfulnesstechniques in  one program. MBSR works best when you understand the treatment, are willing to try new things, and can commit to spending time practicing what you learn. MBSR training may include learning about: How your emotions, thoughts, and reactions affect your body. New ways to respond to things that cause negative thoughts to start (triggers). How to notice your thoughts and let go of them. Practicing awareness of everyday things that you normally do without thinking. The techniques and goals of different types of meditation. What are the benefits of MBSR? MBSR can have many benefits, which include helping you to: Develop self-awareness. This refers to knowing and understanding yourself. Learn skills and attitudes that help you to participate in your own health care. Learn new ways to care for yourself. Be more accepting about how things are, and let things go. Be less judgmental and approach things with an open mind. Be patient with yourself and trust yourself more. MBSR has also been shown to: Reduce negative emotions, such as depression and anxiety. Improve memory and focus. Change how you sense and approach pain. Boost your body's ability to fight infections. Help you connect better with other people. Improve your sense of well-being. Follow these instructions at home:  Find a local in-person or online MBSR program. Set aside some time regularly for mindfulness practice. Find a mindfulness practice that works best for you. This may include one or more of the following: Meditation. Meditation involves focusing your mind on a certain thought or activity. Breathing awareness exercises. These help you to stay present by focusing on your breath. Body scan. For this practice, you lie down and pay attention to each part of your body from head to toe. You can identify tension and soreness and intentionally relax parts of your body. Yoga. Yoga involves stretching and breathing, and it can improve  your ability to move and be flexible. It can also provide an experience of testing your body's limits, which can help you release stress. Mindful eating. This way of eating involves focusing on the taste, texture, color, and smell of each bite of food. Because this slows down eating and helps you feel full sooner, it can be an important part of a weight-loss plan. Find a podcast or recording that provides guidance for breathing awareness, body scan, or meditation exercises. You can listen to these any time when you have a free moment to rest without distractions. Follow your treatment plan as told by your health care provider. This may include taking regular medicines and making changes to your diet or lifestyle as recommended. How to practice mindfulness To do a basic awareness exercise: Find a comfortable place to sit. Pay attention to the present moment. Observe your thoughts, feelings, and surroundings just as they are. Avoid placing judgment on yourself, your feelings, or your surroundings. Make note of any judgment that comes up, and let it go. Your mind may wander, and that is okay. Make note of when your thoughts drift, and return your attention to the present  moment. To do basic mindfulness meditation: Find a comfortable place to sit. This may include a stable chair or a firm floor cushion. Sit upright with your back straight. Let your arms fall next to your side with your hands resting on your legs. If sitting in a chair, rest your feet flat on the floor. If sitting on a cushion, cross your legs in front of you. Keep your head in a neutral position with your chin dropped slightly. Relax your jaw and rest the tip of your tongue on the roof of your mouth. Drop your gaze to the floor. You can close your eyes if you like. Breathe normally and pay attention to your breath. Feel the air moving in and out of your nose. Feel your belly expanding and relaxing with each breath. Your mind may wander,  and that is okay. Make note of when your thoughts drift, and return your attention to your breath. Avoid placing judgment on yourself, your feelings, or your surroundings. Make note of any judgment or feelings that come up, let them go, and bring your attention back to your breath. When you are ready, lift your gaze or open your eyes. Pay attention to how your body feels after the meditation. Where to find more information You can find more information about MBSR from: Your health care provider. Community-based meditation centers or programs. Programs offered near you. Summary Mindfulness-based stress reduction (MBSR) is a program that teaches you how to intentionally pay attention to the present moment. It is used with other treatments to help you cope better with daily stress, emotions, and pain. MBSR focuses on developing self-awareness, which allows you to respond to life stress without judgment or negative emotions. MBSR programs may involve learning different mindfulness practices, such as breathing exercises, meditation, yoga, body scan, or mindful eating. Find a mindfulness practice that works best for you, and set aside time for it on a regular basis. This information is not intended to replace advice given to you by your health care provider. Make sure you discuss any questions you have with your healthcare provider. Document Revised: 10/31/2019 Document Reviewed: 10/31/2019 Elsevier Patient Education  2022 [place] from [employer] is an outline of a general healthy diet based on the Dietary Guidelines for Americans, 2020-2025, from the [place]. Department of Agriculture Scientist, research (physical sciences)). It sets guidelines for how much food you should eat from each food group based onyour age, sex, and level of physical activity. What are tips for following MyPlate? To follow MyPlate recommendations: Eat a wide variety of fruits and vegetables, grains, and protein foods. Serve smaller portions  and eat less food throughout the day. Limit portion sizes to avoid overeating. Enjoy your food. Get at least 150 minutes of exercise every week. This is about 30 minutes each day, 5 or more days per week. It can be difficult to have every meal look like MyPlate. Think about MyPlateas eating guidelines for an entire day, rather than each individual meal. Fruits and vegetables Make one half of your plate fruits and vegetables. Eat many different colors of fruits and vegetables each day. For a 2,000-calorie daily food plan, eat: 2 cups of vegetables every day. 2 cups of fruit every day. 1 cup is equal to: 1 cup raw or cooked vegetables. 1 cup raw fruit. 1 medium-sized orange, apple, or banana. 1 cup 100% fruit or vegetable juice. 2 cups raw leafy greens, such as lettuce, spinach, or kale.  cup dried fruit. Grains One  fourth of your plate should be grains. Make at least half of the grains you eat each day whole grains. For a 2,000-calorie daily food plan, eat 6 oz of grains every day. 1 oz is equal to: 1 slice bread. 1 cup cereal.  cup cooked rice, cereal, or pasta. Protein One fourth of your plate should be protein. Eat a wide variety of protein foods, including meat, poultry, fish, eggs, beans, nuts, and tofu. For a 2,000-calorie daily food plan, eat 5 oz of protein every day. 1 oz is equal to: 1 oz meat, poultry, or fish.  cup cooked beans. 1 egg.  oz nuts or seeds. 1 Tbsp peanut butter. Dairy Drink fat-free or low-fat (1%) milk. Eat or drink dairy as a side to meals. For a 2,000-calorie daily food plan, eat or drink 3 cups of dairy every day. 1 cup is equal to: 1 cup milk, yogurt, cottage cheese, or soy milk (soy beverage). 2 oz processed cheese. 1 oz natural cheese. Fats, oils, salt, and sugars Only small amounts of oils are recommended. Avoid foods that are high in calories and low in nutritional value (empty calories), like foods high in fat or added  sugars. Choose foods that are low in salt (sodium). Choose foods that have less than 140 milligrams (mg) of sodium per serving. Drink water instead of sugary drinks. Drink enough fluid to keep your urine pale yellow. Where to find support Work with your health care provider or a dietitian to develop a customized eating plan that is right for you. Download an app (mobile application) to help you track your daily food intake. Where to find more information USDA: [employer] Summary MyPlate is a general guideline for healthy eating from the USDA. It is based on the Dietary Guidelines for Americans, 2020-2025. In general, fruits and vegetables should take up one half of your plate, grains should take up one fourth of your plate, and protein should take up one fourth of your plate. This information is not intended to replace advice given to you by your health care provider. Make sure you discuss any questions you have with your healthcare provider. Document Revised: 01/05/2020 Document Reviewed: 01/05/2020 Elsevier Patient Education  2022 [employer].

## 2020-08-12 ENCOUNTER — Other Ambulatory Visit: Payer: Self-pay

## 2020-08-12 ENCOUNTER — Encounter: Payer: Self-pay | Admitting: Family Medicine

## 2020-08-12 ENCOUNTER — Ambulatory Visit (INDEPENDENT_AMBULATORY_CARE_PROVIDER_SITE_OTHER): Payer: Medicare Other | Admitting: Family Medicine

## 2020-08-12 VITALS — BP 134/80 | HR 74 | Temp 98.1°F | Ht 59.5 in | Wt 201.4 lb

## 2020-08-12 DIAGNOSIS — E66813 Obesity, class 3: Secondary | ICD-10-CM

## 2020-08-12 DIAGNOSIS — Z8249 Family history of ischemic heart disease and other diseases of the circulatory system: Secondary | ICD-10-CM | POA: Diagnosis not present

## 2020-08-12 DIAGNOSIS — Z5181 Encounter for therapeutic drug level monitoring: Secondary | ICD-10-CM | POA: Diagnosis not present

## 2020-08-12 DIAGNOSIS — F4321 Adjustment disorder with depressed mood: Secondary | ICD-10-CM

## 2020-08-12 DIAGNOSIS — Z0000 Encounter for general adult medical examination without abnormal findings: Secondary | ICD-10-CM

## 2020-08-12 DIAGNOSIS — R635 Abnormal weight gain: Secondary | ICD-10-CM

## 2020-08-12 DIAGNOSIS — D692 Other nonthrombocytopenic purpura: Secondary | ICD-10-CM | POA: Diagnosis not present

## 2020-08-12 DIAGNOSIS — Z6841 Body Mass Index (BMI) 40.0 and over, adult: Secondary | ICD-10-CM

## 2020-08-12 DIAGNOSIS — R5383 Other fatigue: Secondary | ICD-10-CM

## 2020-08-12 DIAGNOSIS — E7800 Pure hypercholesterolemia, unspecified: Secondary | ICD-10-CM

## 2020-08-12 DIAGNOSIS — M25551 Pain in right hip: Secondary | ICD-10-CM | POA: Diagnosis not present

## 2020-08-12 LAB — POCT URINALYSIS DIP (CLINITEK)
Bilirubin, UA: NEGATIVE
Blood, UA: NEGATIVE
Glucose, UA: NEGATIVE mg/dL
Ketones, POC UA: NEGATIVE mg/dL
Leukocytes, UA: NEGATIVE
Nitrite, UA: NEGATIVE
POC PROTEIN,UA: NEGATIVE
Spec Grav, UA: 1.015 (ref 1.010–1.025)
Urobilinogen, UA: 0.2 E.U./dL
pH, UA: 6.5 (ref 5.0–8.0)

## 2020-08-12 NOTE — Progress Notes (Signed)
Chief Complaint  Patient presents with   Annual Exam    MWV no other issues    Lauren [name] is a 70 y.o. female who presents for annual physical exam, Medicare wellness visit and follow-up on chronic medical conditions.    She reports some depression related to a lot of family stressors. Sister was in ICU for 8 days, will need heart surgery (now is home). Big stress is her granddaughter--drugs, health issues (lupus). She doesn't want to talk to anybody or any medications. "I can deal with it"  She had COVID-19 a month ago (second bout--first was in 05/2019).  She was treated with Paxlovid. Cough lingered a while, but now has resolved.  R hip pain.  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 She reports her hip pain is getting worse, has been complaining about worsening pain for the last couple of years.  She has NOT seen orthopedist in at least 2-3 years (despite worsening pain).  Pain is worse with walking.  No pain at rest.  Hyperlipidemia/family history of CAD: She saw Dr. Nelson/cardiology--originally sent for cardiac clearance prior to hip replacement.  She had a normal stress test, and was started on Crestor by Dr. Meda Coffee. She tolerates statin without side effects (as long as she takes it at night, had some dizziness when she wasn't).  She eats very few eggs (2/month), infrequent ice cream, doesn't eat cheese. She no longer eats red meat as often, maybe just once or twice a month (mostly in spaghetti sauce or lasagna). She is due for recheck. Lab Results  Component Value Date   CHOL 184 07/02/2019   HDL 54 07/02/2019   LDLCALC 107 (H) 07/02/2019   TRIG 133 07/02/2019   CHOLHDL 3.4 07/02/2019   Herpes Labialis--Uses Valtrex prn.  They tend to be triggered by the sun (and stress).  Denies any recent outbreak, despite stress and a recent trip to the beach.  Stopped vitamin D supplement about a year ago, recalls being told she didn't need it (then  corrected herself that was likely iron).  Last level was normal, but no checked since 2012.  Immunization History  Administered Date(s) Administered   Influenza Split 11/24/2010   Influenza, High Dose Seasonal PF 10/29/2014, 12/23/2015, 01/11/2017, 12/10/2017   Influenza, Quadrivalent, Recombinant, Inj, Pf 01/09/2019   Influenza,inj,Quad PF,6+ Mos 10/27/2013   Pneumococcal Conjugate-13 10/29/2014   Pneumococcal Polysaccharide-23 10/05/2008, 01/06/2016   Td 02/28/2000   Tdap 10/05/2008   Zoster Recombinat (Shingrix) 10/04/2017, 12/17/2017   Zoster, Live 11/24/2010  She reports having 2 Moderna vaccines, no booster. Didn't get Tdap from pharmacy yet (advised to last year) Last Pap smear: 12/2016, no high risk HPV detected Last mammogram: 09/2019 Last colonoscopy: 07/2016 (scattered diverticula, Dr. Collene Mares) Last DEXA:08/2018 T-0.9 Teola Bradley) Dentist: twice yearly. Ophtho: 4 years, plans to go Exercise: Not walking outside much recently (too many family members sick, plus heat). 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  No regular weight-bearing exercise.    Other doctors caring for patient include: Dentist: [facility] in [place] Dermatologist: Dr. Rolm Bookbinder (hasn't gone in the last year) Cardiologist: Dr. Meda Coffee (moved away) GI: Dr. Collene Mares Ortho: Dr. Alvan Dame Ophtho: Dr. Gershon Crane   Depression screen: PHQ-2 score of 2, PHQ=9 score of 5 Fall screen: negative Functional Status Survey:  Notable for slight trouble hearing (certain tones, or certain people who mumble (when they aren't wearing their dentures), worse since people wear masks), leakage of urine (if  picks up something heavy), can't hold her urine quite as long, or has urge incontinence; sometimes has trouble with stairs, related to her hip. Mini-Cog screen: normal    End of Life Discussion:  Patient has a living will and medical power of attorney   PMH, PSH, SH and FH were reviewed and updated.  Outpatient Encounter  Medications as of 08/12/2020  Medication Sig Note   aspirin EC 81 MG tablet Take 1 tablet (81 mg total) by mouth daily.    cyanocobalamin 1000 MCG tablet Take 1,000 mcg by mouth daily.    Omega-3 Fatty Acids (FISH OIL) 1000 MG CAPS Take 1 capsule by mouth daily.    rosuvastatin (CRESTOR) 10 MG tablet TAKE 1 TABLET BY MOUTH EVERY DAY    acetaminophen (TYLENOL) 500 MG tablet Take 500 mg by mouth every 6 (six) hours as needed. (Patient not taking: No sig reported)    benzonatate (TESSALON) 200 MG capsule Take 1 capsule (200 mg total) by mouth 3 (three) times daily as needed for cough. (Patient not taking: Reported on 08/12/2020)    cetirizine (ZYRTEC) 10 MG tablet Take 10 mg by mouth daily as needed for allergies.  (Patient not taking: No sig reported)    cholecalciferol (VITAMIN D) 1000 units tablet Take 1,000 Units by mouth daily. (Patient not taking: No sig reported) 08/12/2020: Stopped taking about a year ago   dextromethorphan-guaiFENesin (MUCINEX DM) 30-600 MG 12hr tablet Take 1 tablet by mouth 2 (two) times daily. (Patient not taking: Reported on 08/12/2020) 07/05/2020: Last dose 9am   Garlic 382 MG CAPS Take 1 capsule by mouth daily. (Patient not taking: No sig reported) 08/12/2020: Ran out recently   ibuprofen (ADVIL) 200 MG tablet Take 400 mg by mouth every 6 (six) hours as needed. (Patient not taking: Reported on 08/12/2020)    neomycin-bacitracin-polymyxin (NEOSPORIN) [phone] ointment Apply topically in the morning and at bedtime. (Patient not taking: No sig reported)    valACYclovir (VALTREX) 1000 MG tablet TAKE 2 TABLETS AT ONSET OF COLD SORE FOLLOWED BY SECOND DOSE 12 HOURS LATER. 4 TABS PER BREAKOUT (Patient not taking: No sig reported)    No facility-administered encounter medications on file as of 08/12/2020.   Allergies  Allergen Reactions   Morphine And Related     Patient prefers not to received"became addictive to this many years ago"- Demerol is okay    ROS: The patient denies  anorexia, fever, headaches, vision changes, decreased hearing, ear pain, sore throat, breast concerns, chest pain, palpitations, dizziness, syncope, dyspnea on exertion, cough, swelling, nausea, vomiting, diarrhea, constipation, abdominal pain, melena, hematochezia, indigestion/heartburn, hematuria, dysuria, vaginal bleeding, discharge, odor or itch, genital lesions, numbness, tingling, weakness, tremor, suspicious skin lesions (sees derm yearly), depression, anxiety, abnormal bleeding/bruising, or enlarged lymph nodes.   Some leakage of urine with sneeze/cough, urgency/full bladder. No dysuria Right hip pain. +stress and mild depression. +fatigue, not sleeping well--wakes up early, mind starts running, can't get back to sleep.    PHYSICAL EXAM:  BP 134/80   Pulse 74   Temp 98.1 F (36.7 C)   Ht 4' 11.5" (1.511 m)   Wt 201 lb 6.4 oz (91.4 kg)   BMI 40.00 kg/m   Wt Readings from Last 3 Encounters:  08/12/20 201 lb 6.4 oz (91.4 kg)  07/05/20 190 lb (86.2 kg)  02/02/20 195 lb (88.5 kg)  07/2019 194# 6.4 oz (Other listed visits were virtual, home weights).   General Appearance:   Alert, cooperative, no distress, appears stated age  Head:   Normocephalic, without obvious abnormality, atraumatic    Eyes:   PERRL, EOM's intact, fundi benign.    Ears:   Normal TM's and external ear canals    Nose:   Not examined due to coronavirus (wearing mask)    Throat:   Not examined due to coronavirus (wearing mask)      Neck:   Supple, no lymphadenopathy; thyroid: no enlargement/ tenderness/nodules; no carotid bruit or JVD    Back:   Spine nontender, no curvature, ROM normal, no CVA tenderness    Lungs:   Clear to auscultation bilaterally without wheezes, rales or ronchi; respirations unlabored    Chest Wall:   No tenderness or deformity    Heart:   Regular rate and rhythm, S1 and S2 normal, no murmur, rub or gallop    Breast Exam:   No tenderness, masses, or nipple discharge or inversion. No  axillary lymphadenopathy.   Abdomen:   Soft, non-tender, nondistended, normoactive bowel sounds, no masses, no hepatosplenomegaly. WHSS   Genitalia:   Normal external genitalia without lesions. BUS and vagina normal; No abnormal vaginal discharge. Uterus and adnexa nontender, no masses, though exam is somewhat limited due to body habitus. Pap not performed.  Rectal:   Normal tone, no masses or tenderness; guaiac negative stool    Extremities:   No clubbing, cyanosis or edema. WHSS right lateral hip. Tender at right trochanteric bursa  Pulses:   2+ and symmetric all extremities    Skin:   Skin color, texture, turgor normal, no lesions. Some purpura on forearms, as well as many scars from biopsies.   Lymph nodes:   Cervical, supraclavicular, inguinal and axillary nodes normal    Neurologic:   Normal strength, sensation and gait; reflexes 2+ and symmetric throughout                 Psych:   Mildly depressed mood, full range of affect, normal hygiene and grooming   ASSESSMENT/PLAN:  Annual physical exam - Plan: POCT URINALYSIS DIP (CLINITEK)  Medicare annual wellness visit, subsequent  Pure hypercholesterolemia - cont statin, recheck lipids - Plan: Lipid panel  Senile purpura ([place]) - noted on forearms, likely contributed by aspirin, age/thin skin  Medication monitoring encounter - Plan: Lipid panel, Comprehensive metabolic panel, CBC with Differential/Platelet  Weight gain - Plan: TSH  Fatigue, unspecified type - Plan: TSH  Adjustment disorder with depressed mood - mild, related to family illnesses and stressors.  Encouraged counseling, discussed in detail (reluctant at first)  Right hip pain - evidence of troch bursitis on exam. She is s/p THR and past due to see ortho. Encouraged her to f/u (and get relief of her pain w/injection)  Class 3 severe obesity due to excess calories with serious comorbidity and body mass index (BMI) of 40.0 to 44.9 in adult [facility]) - counseled re: diet,  exercise, risks. Wt loss encouraged. comorbities--depression, OA hip, HLD   Counseled re: stress reduction, self-care, mindfulness; encouraged counseling. Educated re: visualization/relaxation techniques she can try to get back to sleep. Reviewed healthy diet, exercise and weight loss recommendations.   Discussed monthly self breast exams and yearly mammograms; at least 30 minutes of aerobic activity at least 5 days/week, weight-bearing exercise 2x/week; proper sunscreen use reviewed; healthy diet, including goals of calcium and vitamin D intake and alcohol recommendations (less than or equal to 1 drink/day) reviewed; regular seatbelt use; changing batteries in smoke detectors. Immunization recommendations discussed--continue yearly high dose flu shots. Tetanus booster (Td or TdaP)  is past due, to get at pharmacy. COVID booster recommended in 1-2 mos (had recent illness). Colonoscopy recommendations reviewed--UTD.   Patient reminded to schedule visits with derm, ophtho, ortho Restart vitamin D   Medicare Attestation I have personally reviewed: The patient's medical and social history Their use of alcohol, tobacco or illicit drugs Their current medications and supplements The patient's functional ability including ADLs,fall risks, home safety risks, cognitive, and hearing and visual impairment Diet and physical activities Evidence for depression or mood disorders  The patient's weight, height, BMI have been recorded in the chart.  I have made referrals, counseling, and provided education to the patient based on review of the above and I have provided the patient with a written personalized care plan for preventive services.

## 2020-08-13 LAB — CBC WITH DIFFERENTIAL/PLATELET
Basophils Absolute: 0 10*3/uL (ref 0.0–0.2)
Basos: 1 %
EOS (ABSOLUTE): 0.1 10*3/uL (ref 0.0–0.4)
Eos: 2 %
Hematocrit: 43.8 % (ref 34.0–46.6)
Hemoglobin: 14 g/dL (ref 11.1–15.9)
Immature Grans (Abs): 0 10*3/uL (ref 0.0–0.1)
Immature Granulocytes: 0 %
Lymphocytes Absolute: 1.5 10*3/uL (ref 0.7–3.1)
Lymphs: 26 %
MCH: 28.7 pg (ref 26.6–33.0)
MCHC: 32 g/dL (ref 31.5–35.7)
MCV: 90 fL (ref 79–97)
Monocytes Absolute: 0.4 10*3/uL (ref 0.1–0.9)
Monocytes: 6 %
Neutrophils Absolute: 3.8 10*3/uL (ref 1.4–7.0)
Neutrophils: 65 %
Platelets: 263 10*3/uL (ref 150–450)
RBC: 4.88 x10E6/uL (ref 3.77–5.28)
RDW: 14 % (ref 11.7–15.4)
WBC: 5.9 10*3/uL (ref 3.4–10.8)

## 2020-08-13 LAB — COMPREHENSIVE METABOLIC PANEL
ALT: 16 IU/L (ref 0–32)
AST: 7 IU/L (ref 0–40)
Albumin/Globulin Ratio: 2 (ref 1.2–2.2)
Albumin: 4.7 g/dL (ref 3.8–4.8)
Alkaline Phosphatase: 69 IU/L (ref 44–121)
BUN/Creatinine Ratio: 13 (ref 12–28)
BUN: 10 mg/dL (ref 8–27)
Bilirubin Total: 0.4 mg/dL (ref 0.0–1.2)
CO2: 21 mmol/L (ref 20–29)
Calcium: 9.2 mg/dL (ref 8.7–10.3)
Chloride: 103 mmol/L (ref 96–106)
Creatinine, Ser: 0.75 mg/dL (ref 0.57–1.00)
Globulin, Total: 2.4 g/dL (ref 1.5–4.5)
Glucose: 99 mg/dL (ref 65–99)
Potassium: 4.1 mmol/L (ref 3.5–5.2)
Sodium: 140 mmol/L (ref 134–144)
Total Protein: 7.1 g/dL (ref 6.0–8.5)
eGFR: 86 mL/min/{1.73_m2} (ref >59)

## 2020-08-13 LAB — LIPID PANEL
Chol/HDL Ratio: 2.7 ratio (ref 0.0–4.4)
Cholesterol, Total: 185 mg/dL (ref 100–199)
HDL: 68 mg/dL (ref >39)
LDL Chol Calc (NIH): 87 mg/dL (ref 0–99)
Triglycerides: 180 mg/dL — ABNORMAL HIGH (ref 0–149)
VLDL Cholesterol Cal: 30 mg/dL (ref 5–40)

## 2020-08-13 LAB — TSH: TSH: 1.19 u[IU]/mL (ref 0.450–4.500)

## 2020-08-13 MED ORDER — ROSUVASTATIN CALCIUM 10 MG PO TABS: 10.0000 mg | ORAL_TABLET | Freq: Every day | ORAL | 3 refills | Status: DC

## 2020-08-23 ENCOUNTER — Telehealth: Payer: Self-pay | Admitting: Family Medicine

## 2020-08-23 NOTE — Telephone Encounter (Signed)
Pt called and said she wants to get a cortisone shot here. Pt stated that she does not want to go to ortho she wants to get it as soon as possible.

## 2020-08-23 NOTE — Telephone Encounter (Signed)
Got pt scheduled 

## 2020-08-23 NOTE — Telephone Encounter (Signed)
You can schedule a visit with me for injection for hip bursitis.

## 2020-08-24 NOTE — Patient Instructions (Signed)
Hip Bursitis  Hip bursitis is the inflammation of one or more bursae in the hip joint. Bursae are small fluid-filled sacs that absorb shock and prevent bones from rubbingagainst each other. Hip bursitis can cause mild to moderate pain, and symptoms often come and goover time. What are the causes? This condition results from increased friction between the hip bones and the tendons around the hip joint. This condition can happen if you: Overuse your hip muscles. Injure your hip. Have weak buttocks muscles. Have bone spurs. Have an infection. In some cases, the cause may not be known. What increases the risk? You are more likely to develop this condition if: You injured your hip previously or had hip surgery. You have a medical condition, such as arthritis, gout, diabetes, or thyroid disease. You have spine problems. You have one leg that is shorter than the other. You participate in athletic activities that include repetitive motion, like running. You participate in sports where there is a risk of injury or falling, such as football, martial arts, or skiing. What are the signs or symptoms? Symptoms may come and go, and they often include: Pain in the hip or groin area. Pain may get worse with movement. Tenderness and swelling of the hip. In rare cases, the bursa may become infected. If this happens, you may get afever, as well as warmth and redness in the hip area. How is this diagnosed? This condition may be diagnosed based on: Your symptoms. Your medical history. A physical exam. Imaging tests, such as: X-rays to check your bones. MRI or ultrasound to check your tendons and muscles. Bone scan. A biopsy to remove fluid from your inflamed bursa for testing. How is this treated? This condition is treated by resting, icing, applying pressure (compression), and raising (elevating) the injured area. This is called RICE treatment. In some cases, RICE treatment may not be enough to make  your symptoms go away. Treatment may also include: Taking medicine to help with swelling and pain. Using crutches, a cane, or a walker to decrease the strain on your hip. Getting a shot of cortisone medicine to help reduce swelling. Taking other medicines if the bursa is infected. Draining fluid out of the bursa to help relieve swelling. Having surgery to remove a damaged or infected bursa. This is rare. Long-term treatment may include: Physical therapy exercises for strength and flexibility. Lifestyle changes, such as weight loss, to reduce the strain on the hip. Follow these instructions at home: Managing pain, stiffness, and swelling     If directed, put ice on the painful area. Put ice in a plastic bag. Place a towel between your skin and the bag. Leave the ice on for 20 minutes, 2-3 times a day. Raise (elevate) your hip as much as you can without pain. To do this, put a pillow under your hips while you lie down. If directed, apply heat to the affected area as often as told by your health care provider. Use the heat source that your health care provider recommends, such as a moist heat pack or a heating pad. Place a towel between your skin and the heat source. Leave the heat on for 20-30 minutes. Remove the heat if your skin turns bright red. This is especially important if you are unable to feel pain, heat, or cold. You may have a greater risk of getting burned. Activity Do not use your hip to support your body weight until your health care provider says that you can. Use crutches,  a cane, or a walker as told by your health care provider. If the affected leg is one that you use to drive, ask your health care provider if it is safe to drive. Rest and protect your hip as much as possible until your pain and swelling get better. Return to your normal activities as told by your health care provider. Ask your health care provider what activities are safe for you. Do exercises as told by  your health care provider. General instructions Take over-the-counter and prescription medicines only as told by your health care provider. Gently massage and stretch your injured area as often as is comfortable. Wear compression wraps only as told by your health care provider. If one of your legs is shorter than the other, get fitted for a shoe insert or orthotic. Maintain a healthy weight. Follow instructions from your health care provider for weight control. These may include dietary restrictions. Keep all follow-up visits as told by your health care provider. This is important. How is this prevented? Exercise regularly, as told by your health care provider. Wear supportive footwear that is appropriate for your sport. Warm up and stretch before being active. Cool down and stretch after being active. Take breaks regularly from repetitive activity. If an activity irritates your hip or causes pain, avoid the activity as much as possible. Avoid sitting down for long periods at a time. Where to find more information American Academy [employer]: orthoinfo.aaos.org Contact a health care provider if: You have a fever. You develop new symptoms. You have trouble walking or doing everyday activities. You have pain that gets worse or does not get better with medicine. You develop red skin or a feeling of warmth in your hip area. Get help right away if: You cannot move your hip. You have severe pain. You cannot control the muscles in your feet. Summary Hip bursitis is the inflammation of one or more bursae in the hip joint. Bursae are small fluid-filled sacs that absorb shock and prevent bones from rubbing against each other. Hip bursitis can cause hip or groin pain, and symptoms often come and go over time. This condition is often treated by resting, icing, applying pressure (compression), and raising (elevating) the injured area. Other treatments may be needed. This information is  not intended to replace advice given to you by your health care provider. Make sure you discuss any questions you have with your healthcare provider. Document Revised: 12/16/2018 Document Reviewed: 10/22/2017 Elsevier Patient Education  2022 [employer].

## 2020-08-24 NOTE — Progress Notes (Signed)
Chief Complaint  Patient presents with   Injections    Right hip injection.     Patient presents for cortisone injection for treatment of R trochanteric bursa. She had been encouraged to schedule follow-up with her orthopedist (s/p THA in 2016, past due for f/u), but she preferred to get this injection as soon as possible, here. She called to schedule with ortho, and there was a few week wait.  PMH, PSH, SH reviewed  Outpatient Encounter Medications as of 08/26/2020  Medication Sig Note   aspirin EC 81 MG tablet Take 1 tablet (81 mg total) by mouth daily.    cyanocobalamin 1000 MCG tablet Take 1,000 mcg by mouth daily.    Garlic 976 MG CAPS Take 1 capsule by mouth daily. 08/12/2020: Ran out recently   Omega-3 Fatty Acids (FISH OIL) 1000 MG CAPS Take 1 capsule by mouth daily.    rosuvastatin (CRESTOR) 10 MG tablet Take 1 tablet (10 mg total) by mouth daily.    acetaminophen (TYLENOL) 500 MG tablet Take 500 mg by mouth every 6 (six) hours as needed. (Patient not taking: No sig reported)    cetirizine (ZYRTEC) 10 MG tablet Take 10 mg by mouth daily as needed for allergies.  (Patient not taking: No sig reported)    cholecalciferol (VITAMIN D) 1000 units tablet Take 1,000 Units by mouth daily. (Patient not taking: No sig reported) 08/12/2020: Stopped taking about a year ago   ibuprofen (ADVIL) 200 MG tablet Take 400 mg by mouth every 6 (six) hours as needed. (Patient not taking: No sig reported)    valACYclovir (VALTREX) 1000 MG tablet TAKE 2 TABLETS AT ONSET OF COLD SORE FOLLOWED BY SECOND DOSE 12 HOURS LATER. 4 TABS PER BREAKOUT (Patient not taking: No sig reported)    [DISCONTINUED] benzonatate (TESSALON) 200 MG capsule Take 1 capsule (200 mg total) by mouth 3 (three) times daily as needed for cough. (Patient not taking: Reported on 08/12/2020)    [DISCONTINUED] dextromethorphan-guaiFENesin (MUCINEX DM) 30-600 MG 12hr tablet Take 1 tablet by mouth 2 (two) times daily. (Patient not taking: Reported  on 08/12/2020) 07/05/2020: Last dose 9am   [DISCONTINUED] neomycin-bacitracin-polymyxin (NEOSPORIN) [phone] ointment Apply topically in the morning and at bedtime. (Patient not taking: No sig reported)    No facility-administered encounter medications on file as of 08/26/2020.   Allergies  Allergen Reactions   Morphine And Related     Patient prefers not to received"became addictive to this many years ago"- Demerol is okay   ROS: no fever, chills, n/v/d or rashes. Pain R hip per HPI. Verbal consent was obtained, understanding the risks and alternatives to the procedure, wishes to proceed.  PHYSICAL EXAM: BP 140/70   Pulse 84   Ht 4' 11.5" (1.511 m)   Wt 201 lb 9.6 oz (91.4 kg)   BMI 40.04 kg/m   Tender to palpation overlying right greater trochanter.  No erythema.   Procedure:  right lateral hip was cleaned with alcohol.  Ethyl chloride spray was used and 20mg  of kenalog and 1.5 cc of 2% lido without epi was injected into the trochanteric bursa. She tolerated the procedure without complication. She noted decrease in pain following the procedure   ASSESSMENT/PLAN:  Trochanteric bursitis, right hip - Plan: PR DRAIN/INJECT LARGE JOINT/BURSA   Procedure only

## 2020-08-26 ENCOUNTER — Encounter: Payer: Self-pay | Admitting: Family Medicine

## 2020-08-26 ENCOUNTER — Other Ambulatory Visit: Payer: Self-pay

## 2020-08-26 ENCOUNTER — Ambulatory Visit (INDEPENDENT_AMBULATORY_CARE_PROVIDER_SITE_OTHER): Payer: Medicare Other | Admitting: Family Medicine

## 2020-08-26 VITALS — BP 140/70 | HR 84 | Ht 59.5 in | Wt 201.6 lb

## 2020-08-26 DIAGNOSIS — M7061 Trochanteric bursitis, right hip: Secondary | ICD-10-CM | POA: Diagnosis not present

## 2020-08-26 MED ORDER — TRIAMCINOLONE ACETONIDE 40 MG/ML IJ SUSP
20.0000 mg | Freq: Once | INTRAMUSCULAR | Status: AC
  Administered 2020-08-26: 20 mg via INTRAMUSCULAR

## 2020-08-26 MED ORDER — LIDOCAINE HCL 2 % IJ SOLN
1.5000 mL | Freq: Once | INTRAMUSCULAR | Status: AC
  Administered 2020-08-26: 30 mg via INTRADERMAL

## 2020-08-26 NOTE — Progress Notes (Signed)
Thanks, I'll watch for it

## 2020-08-26 NOTE — Addendum Note (Signed)
Addended by: Carolee Rota F on: 08/26/2020 03:33 PM   Modules accepted: Orders

## 2020-09-15 NOTE — Progress Notes (Deleted)
Cardiology Office Note:    Date:  09/15/2020   ID:  Lauren, [name] 1951/01/16, MRN [id number]  PCP:  Rita Ohara, MD   [facility] Providers Cardiologist:  None {    Referring MD: Rita Ohara, MD    History of Present Illness:    Lauren [name] is a 70 y.o. female with a family history of premature CAD, HLD and arthritis who was previously followed by Dr. Meda Coffee who now presents to clinic for follow-up.  Per review of the record, the patient was initially seen by Dr. Meda Coffee in 2016 for preoperative clearance for right hip replacement. Myoview in 2016 normal. She has a very strong family history of CAD. Her mother died of MI age age 29, father had CVA at 36, she is one of 10 siblings, no one made it beyond age 31 and several died of MI - 2 brothers had MIs in 2', received stents and died. Another sister had MI at age 30 and is alive at age 16.  Last saw Dr. Meda Coffee in 2021 where she was doing well from CV standpoint but continued to have orthopedic issues.   Today,.  Past Medical History:  Diagnosis Date   Arthritis    hips-Right worse and right knee.   COVID-19 05/2019   Facial basal cell cancer    nose (Dr. Ubaldo Glassing), leg   Herpes simplex labialis    Hyperlipidemia    Melanoma ([place])    right shoulder- 10 yrs ago- no further issues   SCC (squamous cell carcinoma) 11/16/2016   left leg, 12/2018 RLE   Squamous cell skin cancer    many sites (hands/legs)    Past Surgical History:  Procedure Laterality Date   APPENDECTOMY     CARDIOVASCULAR STRESS TEST  06-2005   Dr. Rex Kras   CESAREAN SECTION  x2   MOHS SURGERY Left 07/10/2017   LLE for SCC, Dr. Sarajane Jews   REFRACTIVE SURGERY  age 59   [place] Right 01/19/2015   Procedure: TOTAL RIGHT HIP ARTHROPLASTY ANTERIOR APPROACH;  Surgeon: Paralee Cancel, MD;  Location: [facility];  Service: Orthopedics;  Laterality: Right;    Current Medications: No outpatient medications have been marked as  taking for the 09/17/20 encounter (Appointment) with Freada Bergeron, MD.     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 Both sons live in [place]. 2 grandchildren, and 3 great-grandchildren who "practically live with her" (live down the street).  She is very active with them (taking them places).   She is "semi-retired".   Social Determinants of Health   Financial Resource Strain: Not on file  Food Insecurity: Not on file  Transportation Needs: Not on  file  Physical Activity: Not on file  Stress: Not on file  Social Connections: Not on file     Family History: The patient's ***family history includes Alcohol abuse in her brother and father; Arthritis in her sister; Breast cancer (age of onset: 33) in her sister; COPD in her brother, brother, mother, and sister; Diabetes in her brother, sister, sister, and sister; Gout in her sister; Berenice Primas' disease in her sister; Heart disease in her brother, brother, sister, sister, sister, and sister; Heart disease (age of onset: 63) in her son; Heart disease (age of onset: 67) in her mother; Hyperlipidemia in her mother, sister, and sister; Hypertension in her mother, sister, and sister; Lupus in her sister; Stroke in her son; Stroke (age of onset: 33) in her father.  ROS:   Please see the history of present illness.    *** All other  systems reviewed and are negative.  EKGs/Labs/Other Studies Reviewed:    The following studies were reviewed today: Myoview 2016: The left ventricular ejection fraction is hyperdynamic (>65%). Nuclear stress EF: 70%. There was no ST segment deviation noted during stress. Defect 1: There is a small defect of moderate severity present in the apex location. Likely due to apical thinning, as there is normal wall motion. Cannot exclude prior infarct. The study is normal. This is a low risk study.   EKG:  EKG is *** ordered today.  The ekg ordered today demonstrates ***  Recent Labs: 08/12/2020: ALT 16; BUN 10; Creatinine, Ser 0.75; Hemoglobin 14.0; Platelets 263; Potassium 4.1; Sodium 140; TSH 1.190  Recent Lipid Panel    Component Value Date/Time   CHOL 185 08/12/2020 1046   TRIG 180 (H) 08/12/2020 1046   HDL 68 08/12/2020 1046   CHOLHDL 2.7 08/12/2020 1046   CHOLHDL 2.1 01/11/2017 1003   VLDL 31 (H) 01/06/2016 0855   LDLCALC 87 08/12/2020 1046   LDLCALC 100 (H) 01/11/2017 1003     Risk Assessment/Calculations:   {Does this patient have ATRIAL FIBRILLATION?[phone]}       Physical Exam:    VS:  There were no vitals taken for this visit.    Wt Readings from Last 3 Encounters:  08/26/20 201 lb 9.6 oz (91.4 kg)  08/12/20 201 lb 6.4 oz (91.4 kg)  07/05/20 190 lb (86.2 kg)     GEN: *** Well nourished, well developed in no acute distress HEENT: Normal NECK: No JVD; No carotid bruits LYMPHATICS: No lymphadenopathy CARDIAC: ***RRR, no murmurs, rubs, gallops RESPIRATORY:  Clear to auscultation without rales, wheezing or rhonchi  ABDOMEN: Soft, non-tender, non-distended MUSCULOSKELETAL:  No edema; No deformity  SKIN: Warm and dry NEUROLOGIC:  Alert and oriented x 3 PSYCHIATRIC:  Normal affect   ASSESSMENT:    No diagnosis found. PLAN:    In order of problems listed above:  #Family History of Premature CAD: Has very strong history of premature CAD in 6 siblings  including sudden cardiac death. Lexiscan nuclear stress test  in 2016 showed no prior infarct and no ischemia. On aggressive medical management. -? Ca score -Continue crestor 10mg  daily -Continue ASA 81mg  daily  #HLD: -Continue crestor 10mg  daily  {Are you ordering a CV Procedure (e.g. stress test, cath, DCCV, TEE, etc)?   Press F2        :[id number]}    Medication Adjustments/Labs and Tests Ordered: Current medicines are reviewed at length with the patient today.  Concerns regarding medicines are outlined above.  No orders of the defined types were placed in  this encounter.  No orders of the defined types were placed in this encounter.   There are no Patient Instructions on file for this visit.   Signed, Freada Bergeron, MD  09/15/2020 1:40 PM    [place]

## 2020-09-17 ENCOUNTER — Ambulatory Visit (HOSPITAL_BASED_OUTPATIENT_CLINIC_OR_DEPARTMENT_OTHER): Payer: Medicare Other | Admitting: Cardiology

## 2020-11-05 DIAGNOSIS — Z1231 Encounter for screening mammogram for malignant neoplasm of breast: Secondary | ICD-10-CM | POA: Diagnosis not present

## 2020-11-05 LAB — HM MAMMOGRAPHY

## 2020-11-10 ENCOUNTER — Ambulatory Visit (INDEPENDENT_AMBULATORY_CARE_PROVIDER_SITE_OTHER): Payer: Medicare Other | Admitting: Family Medicine

## 2020-11-10 ENCOUNTER — Encounter: Payer: Self-pay | Admitting: Family Medicine

## 2020-11-10 ENCOUNTER — Other Ambulatory Visit: Payer: Self-pay

## 2020-11-10 ENCOUNTER — Encounter: Payer: Self-pay | Admitting: *Deleted

## 2020-11-10 VITALS — BP 140/84 | HR 68 | Ht 59.5 in | Wt 201.8 lb

## 2020-11-10 DIAGNOSIS — M79672 Pain in left foot: Secondary | ICD-10-CM | POA: Diagnosis not present

## 2020-11-10 DIAGNOSIS — Z23 Encounter for immunization: Secondary | ICD-10-CM | POA: Diagnosis not present

## 2020-11-10 NOTE — Progress Notes (Signed)
Chief Complaint  Patient presents with   Foot Pain    Left foot pain off and on for months, has worsened. She feels like all the recent rain lately has made it worse as well. Last Saturday she went grocery shopping and she felt like at the end of the shopping trip the pain was so bad that she almost wanted to ask one of the employees to bring her a wheelchair.     She is having pain across the top of the left foot.  Off/on for months.  She thought it could be related to her shoes, or too much walking, but seems to be getting worse.  One day she could barely walk due to pain (wearing sandals/flip flops).  This was over Labor Day weekend when rainy/cold. She tries to wear more supportive sandals not flat ones without any cushion/support. She only occasionally has pain on the right, mainly is on the left foot. She notes her toes on left foot are shifting (pointing to the outside). She had a raised blue area on the top of the left foot when it was painful.  Denies injury, and has since resolved. Has bunion on R that is getting worse.  Cut vein on R posterior calf--bled so much she had to go to ER.  This occurred 1/30 per chart (pt stated a few months ago), and hasn't had any further bleeding.  BP elevated today--she has no h/o HTN. She has had some borderline values.  Reports some discomfort currently. BP Readings from Last 3 Encounters:  11/10/20 140/84  08/26/20 140/70  08/12/20 134/80    PMH, PSH, SH reviewed  Outpatient Encounter Medications as of 11/10/2020  Medication Sig Note   aspirin EC 81 MG tablet Take 1 tablet (81 mg total) by mouth daily.    cyanocobalamin 1000 MCG tablet Take 1,000 mcg by mouth daily.    ibuprofen (ADVIL) 200 MG tablet Take 400 mg by mouth every 6 (six) hours as needed. 11/10/2020: Yesterday afternoon   Omega-3 Fatty Acids (FISH OIL) 1000 MG CAPS Take 1 capsule by mouth daily.    rosuvastatin (CRESTOR) 10 MG tablet Take 1 tablet (10 mg total) by mouth daily.     acetaminophen (TYLENOL) 500 MG tablet Take 500 mg by mouth every 6 (six) hours as needed. (Patient not taking: No sig reported)    cetirizine (ZYRTEC) 10 MG tablet Take 10 mg by mouth daily as needed for allergies.  (Patient not taking: No sig reported)    Garlic [id number] MG CAPS Take 1 capsule by mouth daily. (Patient not taking: Reported on 11/10/2020)    valACYclovir (VALTREX) 1000 MG tablet TAKE 2 TABLETS AT ONSET OF COLD SORE FOLLOWED BY SECOND DOSE 12 HOURS LATER. 4 TABS PER BREAKOUT (Patient not taking: No sig reported)    [DISCONTINUED] cholecalciferol (VITAMIN D) 1000 units tablet Take 1,000 Units by mouth daily. (Patient not taking: No sig reported) 08/12/2020: Stopped taking about a year ago   No facility-administered encounter medications on file as of 11/10/2020.   Allergies  Allergen Reactions   Morphine And Related     Patient prefers not to received"became addictive to this many years ago"- Demerol is okay   ROS:  no fever, chills, URI symptoms, headaches, dizziness, chest pain. No edema. +pain in feet per HPI.  No GI complaints. No numbness, tingling or weakness. Moods are good.   PHYSICAL EXAM:  BP 140/84   Pulse 68   Ht 4' 11.5" (1.511 m)  Wt 201 lb 12.8 oz (91.5 kg)   BMI 40.08 kg/m   Well-appearing, pleasant female in no distress Extremities: 2+ pulses, no edema Tender at 2nd MTP on top of L foot (nontender along the dorsum of the joint).  No swelling or erythema or warmth noted. No pain with movement of any of the joints.  Nontender at arch and calcaneus. Deviation of the 2-4th toes on the left foot noted No hammertoe or other abnl. Mild bunion deformity, mainly on right foot. Varicose vein R posterior calf.    ASSESSMENT/PLAN:  Left foot pain - Plan: Ambulatory referral to Podiatry  Need for influenza vaccination - Plan: Flu Vaccine QUAD High Dose(Fluad)  Refer to podiatrist. Suspect arthritis. Discussed proper shoewear. Recommended x-ray (ddx reviewed),  but can let podiatrist do it at their office at visit.

## 2020-11-10 NOTE — Patient Instructions (Signed)
The guidelines changed since you last saw Dr. Meda Coffee regarding the use of aspirin for primary prevention.  It is only recommended for people with known heart disease or history of stroke.  The potential risks of daily aspirin in your age group may outweigh the potential benefit.  You may want to discuss stopping the aspirin with your cardiologist.  I don't see a scheduled appointment (just the cancelled one in 08/2020 with Dr. Johney Frame), so you may want to double-check on that.  I suspect a component of arthritis in your foot.  Can't rule out the possibility of a compression fracture, or some other underlying issue (usually there is more pain and swelling with a fracture).  Please try and wear supportive shoes (arch support, and wide enough shoes). We are referring you to podiatry, and they can do x-rays at your visit.

## 2020-11-18 ENCOUNTER — Other Ambulatory Visit: Payer: Self-pay

## 2020-11-18 ENCOUNTER — Ambulatory Visit: Payer: Medicare Other | Admitting: Podiatry

## 2020-11-18 ENCOUNTER — Ambulatory Visit (INDEPENDENT_AMBULATORY_CARE_PROVIDER_SITE_OTHER): Payer: Medicare Other

## 2020-11-18 ENCOUNTER — Encounter: Payer: Self-pay | Admitting: Podiatry

## 2020-11-18 DIAGNOSIS — M21619 Bunion of unspecified foot: Secondary | ICD-10-CM

## 2020-11-18 DIAGNOSIS — M79672 Pain in left foot: Secondary | ICD-10-CM

## 2020-11-18 DIAGNOSIS — M778 Other enthesopathies, not elsewhere classified: Secondary | ICD-10-CM | POA: Diagnosis not present

## 2020-11-18 DIAGNOSIS — M21611 Bunion of right foot: Secondary | ICD-10-CM

## 2020-11-18 DIAGNOSIS — M79671 Pain in right foot: Secondary | ICD-10-CM

## 2020-11-18 DIAGNOSIS — M21612 Bunion of left foot: Secondary | ICD-10-CM | POA: Diagnosis not present

## 2020-11-18 MED ORDER — TRIAMCINOLONE ACETONIDE 10 MG/ML IJ SUSP
10.0000 mg | Freq: Once | INTRAMUSCULAR | Status: AC
  Administered 2020-11-18: 10 mg

## 2020-11-18 NOTE — Patient Instructions (Signed)

## 2020-11-18 NOTE — Progress Notes (Signed)
Subjective:   Patient ID: Lauren [name], female   DOB: 70 y.o.   MRN: [id number]   HPI Patient presents with pain on top of the left foot that is been bothersome and also structural bunion deformity right over left that she knows she needs to get corrected with family history.  Patient does not smoke likes to be active and the pain on top of the left foot has been more recent and its presentation   Review of Systems  All other systems reviewed and are negative.      Objective:  Physical Exam Vitals and nursing note reviewed.  Constitutional:      Appearance: She is well-developed.  Pulmonary:     Effort: Pulmonary effort is normal.  Musculoskeletal:        General: Normal range of motion.  Skin:    General: Skin is warm.  Neurological:     Mental Status: She is alert.    Neurovascular status intact muscle strength found to be adequate range of motion adequate.  Patient is noted to have discomfort on the dorsum of the left foot and the extensor tendon Croop complex across the midtarsal joint and is also noted to have on the right foot a structural bunion deformity that is painful and red and has been going on for a while and has tried wider shoes and other modalities without relief of symptoms     Assessment:  Center tendinitis with inflammation dorsal left along with structural bunion deformity right with pain     Plan:  H&P reviewed both conditions.  She like to get the bunion corrected but we will get a wait until approximate December January and she will schedule and then be seen back by me to discuss distal osteotomy.  For the left I discussed extensor tendinitis I did sterile prep and injected the extensor complex 3 mg dexamethasone Kenalog 5 mg Xylocaine and advised on wider shoe gear.  Patient to be seen back for consult  X-rays indicate there is some midfoot roughness of the bone structure left and there is structural bunion deformity right with deviation of the hallux  against the second toe

## 2020-11-22 ENCOUNTER — Telehealth: Payer: Self-pay | Admitting: Urology

## 2020-11-22 NOTE — Telephone Encounter (Signed)
DOS - 12/14/20  [place] BUNIONECTOMY RIGHT --- [phone]   [facility] EFFECTIVE DATE - 02/28/20   PLAN DEDUCTIBLE - $0.00 OUT OF POCKET - $3,600.00 W/ $3,559.56 REMAINING COINSURANCE - 0% COPAY - $295.00   SPOKE WITH RIZA F. WITH [employer] AND SHE STATED THAT FOR CPT CODE 85927 NO PRIOR AUTH IS REQUIRED.  REF # [id number]

## 2020-11-23 ENCOUNTER — Other Ambulatory Visit: Payer: Self-pay | Admitting: Podiatry

## 2020-11-23 DIAGNOSIS — M21619 Bunion of unspecified foot: Secondary | ICD-10-CM

## 2020-12-03 ENCOUNTER — Telehealth: Payer: Self-pay

## 2020-12-03 ENCOUNTER — Ambulatory Visit: Payer: Medicare Other | Admitting: Podiatry

## 2020-12-03 NOTE — Telephone Encounter (Signed)
Lauren [name] called to cancel her surgery with Dr. Paulla Dolly on 12/14/2020. She stated its just not a good time right now. She has a lot going on with her family and needs to hold off. I told her to call me as soon as she is ready to reschedule. Notified Dr. Paulla Dolly and Caren Griffins with [facility]

## 2020-12-13 ENCOUNTER — Encounter: Payer: Medicare Other | Admitting: Podiatry

## 2020-12-20 ENCOUNTER — Encounter: Payer: Medicare Other | Admitting: Podiatry

## 2021-01-14 DIAGNOSIS — L570 Actinic keratosis: Secondary | ICD-10-CM | POA: Diagnosis not present

## 2021-01-14 DIAGNOSIS — Z85828 Personal history of other malignant neoplasm of skin: Secondary | ICD-10-CM | POA: Diagnosis not present

## 2021-01-14 DIAGNOSIS — L821 Other seborrheic keratosis: Secondary | ICD-10-CM | POA: Diagnosis not present

## 2021-01-14 DIAGNOSIS — D485 Neoplasm of uncertain behavior of skin: Secondary | ICD-10-CM | POA: Diagnosis not present

## 2021-01-14 DIAGNOSIS — L918 Other hypertrophic disorders of the skin: Secondary | ICD-10-CM | POA: Diagnosis not present

## 2021-01-14 DIAGNOSIS — L578 Other skin changes due to chronic exposure to nonionizing radiation: Secondary | ICD-10-CM | POA: Diagnosis not present

## 2021-02-15 ENCOUNTER — Encounter: Payer: Self-pay | Admitting: Family Medicine

## 2021-03-21 NOTE — Progress Notes (Deleted)
Cardiology Office Note:    Date:  03/21/2021   ID:  Dustine, Bertini 10-27-1950, MRN [id number]  PCP:  Rita Ohara, MD   [facility] Providers Cardiologist:  None {   Referring MD: Rita Ohara, MD    History of Present Illness:    Lauren [name] is a 71 y.o. female with a hx of HLD, arthritis and family history of CAD who previously followed by Dr. Meda Coffee who now returns to clinic for follow-up.  Per review of the record, the patient has very strong history of CAD where her mother died of MI age age 16, father had CVA at 79, she is one of 27 siblings, no one made it beyond age 80 and several died of MI - 2 brothers had MIs in 85', received stents and died. Another sister had MI at age 34 and is alive at age 30.  Last saw Dr. Meda Coffee on 06/2019 she was struggling with orthopedic issues. No significant CV symptoms.   Past Medical History:  Diagnosis Date   Arthritis    hips-Right worse and right knee.   COVID-19 05/2019   Facial basal cell cancer    nose (Dr. Ubaldo Glassing), leg   Herpes simplex labialis    Hyperlipidemia    Melanoma ([place])    right shoulder- 10 yrs ago- no further issues   SCC (squamous cell carcinoma) 11/16/2016   left leg, 12/2018 RLE   Squamous cell skin cancer    many sites (hands/legs)    Past Surgical History:  Procedure Laterality Date   APPENDECTOMY     CARDIOVASCULAR STRESS TEST  06-2005   Dr. Rex Kras   CESAREAN SECTION  x2   MOHS SURGERY Left 07/10/2017   LLE for SCC, Dr. Sarajane Jews   REFRACTIVE SURGERY  age 15   [place] Right 01/19/2015   Procedure: TOTAL RIGHT HIP ARTHROPLASTY ANTERIOR APPROACH;  Surgeon: Paralee Cancel, MD;  Location: [facility];  Service: Orthopedics;  Laterality: Right;    Current Medications: No outpatient medications have been marked as taking for the 03/25/21 encounter (Appointment) with Freada Bergeron, MD.     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 Both sons live in [place]. 2 grandchildren, and 3 great-grandchildren who "practically live with her" (live down the street).  She is very active with them (taking them places).   She is "semi-retired".   Social Determinants of Health   Financial Resource Strain: Not on file  Food Insecurity: Not on file  Transportation Needs: Not on file  Physical Activity: Not on file  Stress: Not on file  Social Connections: Not on file     Family History: The patient's ***family history  includes Alcohol abuse in her brother and father; Arthritis in her sister; Breast cancer (age of onset: 16) in her sister; COPD in her brother, brother, mother, and sister; Diabetes in her brother, sister, sister, and sister; Gout in her sister; Berenice Primas' disease in her sister; Heart disease in her brother, brother, sister, sister, sister, and sister; Heart disease (age of onset: 27) in her son; Heart disease (age of onset: 53) in her mother; Hyperlipidemia in her mother, sister, and sister; Hypertension in her mother, sister, and sister; Lupus in her sister; Stroke in her son; Stroke (age of onset: 51) in her father.  ROS:   Please see the history of present illness.    *** All other systems reviewed and are negative.  EKGs/Labs/Other Studies Reviewed:    The following studies were reviewed today: Myoview 2016: The left ventricular  ejection fraction is hyperdynamic (>65%). Nuclear stress EF: 70%. There was no ST segment deviation noted during stress. Defect 1: There is a small defect of moderate severity present in the apex location. Likely due to apical thinning, as there is normal wall motion. Cannot exclude prior infarct. The study is normal. This is a low risk study.  EKG:  EKG is *** ordered today.  The ekg ordered today demonstrates ***  Recent Labs: 08/12/2020: ALT 16; BUN 10; Creatinine, Ser 0.75; Hemoglobin 14.0; Platelets 263; Potassium 4.1; Sodium 140; TSH 1.190  Recent Lipid Panel    Component Value Date/Time   CHOL 185 08/12/2020 1046   TRIG 180 (H) 08/12/2020 1046   HDL 68 08/12/2020 1046   CHOLHDL 2.7 08/12/2020 1046   CHOLHDL 2.1 01/11/2017 1003   VLDL 31 (H) 01/06/2016 0855   LDLCALC 87 08/12/2020 1046   LDLCALC 100 (H) 01/11/2017 1003     Risk Assessment/Calculations:   {Does this patient have ATRIAL FIBRILLATION?[phone]}       Physical Exam:    VS:  There were no vitals taken for this visit.    Wt Readings from Last 3 Encounters:  11/10/20 201 lb 12.8 oz (91.5 kg)  08/26/20 201 lb 9.6 oz (91.4 kg)  08/12/20 201 lb 6.4 oz (91.4 kg)     GEN: *** Well nourished, well developed in no acute distress HEENT: Normal NECK: No JVD; No carotid bruits LYMPHATICS: No lymphadenopathy CARDIAC: ***RRR, no murmurs, rubs, gallops RESPIRATORY:  Clear to auscultation without rales, wheezing or rhonchi  ABDOMEN: Soft, non-tender, non-distended MUSCULOSKELETAL:  No edema; No deformity  SKIN: Warm and dry NEUROLOGIC:  Alert and oriented x 3 PSYCHIATRIC:  Normal affect   ASSESSMENT:    No diagnosis found. PLAN:    In order of problems listed above:  #Family History of CAD: Myoview 2016 without evidence of ischemia or infaction.  -Check Ca score  #HLD: -Continue crestor 10mg  daily     {Are you ordering a CV Procedure (e.g. stress test, cath, DCCV, TEE, etc)?   Press F2         :[id number]}    Medication Adjustments/Labs and Tests Ordered: Current medicines are reviewed at length with the patient today.  Concerns regarding medicines are outlined above.  No orders of the defined types were placed in this encounter.  No orders of the defined types were placed in this encounter.   There are no Patient Instructions on file for this visit.   Signed, Freada Bergeron, MD  03/21/2021 3:16 PM    [place]

## 2021-03-24 ENCOUNTER — Encounter: Payer: Self-pay | Admitting: Family Medicine

## 2021-03-24 ENCOUNTER — Other Ambulatory Visit: Payer: Self-pay

## 2021-03-24 ENCOUNTER — Telehealth (INDEPENDENT_AMBULATORY_CARE_PROVIDER_SITE_OTHER): Payer: Medicare Other | Admitting: Family Medicine

## 2021-03-24 VITALS — Ht 59.5 in | Wt 200.0 lb

## 2021-03-24 DIAGNOSIS — U071 COVID-19: Secondary | ICD-10-CM | POA: Diagnosis not present

## 2021-03-24 MED ORDER — MOLNUPIRAVIR EUA 200MG CAPSULE: 4.0000 | ORAL_CAPSULE | Freq: Two times a day (BID) | ORAL | 0 refills | Status: AC

## 2021-03-24 NOTE — Patient Instructions (Signed)
Drink plenty of water. Don't use Afrin often, and definitely no more than 2-3 days. I'd prefer that you use sudafed (decongestant) if/when needed for sinus pain. Continue Mucinex DM 12 hour twice daily.  Take the antiviral medication twice daly for 5 days.  You need to isolate Tuesday through Saturday (days 1-5, with day 0 being your first day of symptoms, Monday 1/23).   Days 6-10--if you no longer have fever and your respiratory symptoms are improving, you may leave your house, but must wear mask 100% of the time. Nancy Fetter through [employer] next week) If not improving, isolate for the full 10 days.  Be sure to seek care immediately if you develop shortness of breath, pain with breathing, persistent high fevers, confusion or other neurologic symptoms (weakness, numbness), swollen painful leg, chest pain, or any other concerns.  I hope you feel better soon!

## 2021-03-24 NOTE — Progress Notes (Signed)
Start time: 11:45 End time: 12:01  Virtual Visit via Video Note  I connected with Lauren [name] on 03/24/21 by a video enabled telemedicine application and verified that I am speaking with the correct person using two identifiers.  Location: Patient: home Provider: office   I discussed the limitations of evaluation and management by telemedicine and the availability of in person appointments. The patient expressed understanding and agreed to proceed.  History of Present Illness:  Chief Complaint  Patient presents with   Covid Positive    VIRTUAL covid positive yesterday. Headache began Sunday and Monday and now she has cough that started Tuesday and Chest congestion that started yesterday evening. Had fever Sunday and Monday that has now gone. (Has not taken her regular meds since Sunday, only taking Mucinex DM)    This is her 3rd time getting COVID. She was treated with Paxlovid in 06/2020  Her great granddaughter stayed with her over the weekend, and she had laid around with a headache, got a fever on Sunday, and subsequently tested + for COVID.  Patient felt well until late Sunday night/early Monday morning (1/23) when she started with headache. Tues--HA, and that night she started with congestion and coughing. Nasal drainage is clear. +facial pain, below and above her eyes, bilaterally. Fever of 100-102, started Tuesday. No tylenol since yesterday morning. +cough throughout the day, meds help.  Cough wakes her up at night. She has been taking Mucinex DM.  Just picked up Afrin today, used it once this morning.  She had initial COVID vaccines, no boosters, doesn't want any.   PMH, PSH, SH reviewed  Outpatient Encounter Medications as of 03/24/2021  Medication Sig Note   dextromethorphan-guaiFENesin (MUCINEX DM) 30-600 MG 12hr tablet Take 1 tablet by mouth 2 (two) times daily. 03/24/2021: Last dose 10:00am   molnupiravir EUA (LAGEVRIO) 200 mg CAPS capsule Take 4 capsules (800  mg total) by mouth 2 (two) times daily for 5 days.    acetaminophen (TYLENOL) 500 MG tablet Take 500 mg by mouth every 6 (six) hours as needed. (Patient not taking: Reported on 08/11/2019) 03/24/2021: As needed   aspirin EC 81 MG tablet Take 1 tablet (81 mg total) by mouth daily. (Patient not taking: Reported on 03/24/2021)    cetirizine (ZYRTEC) 10 MG tablet Take 10 mg by mouth daily as needed for allergies.  (Patient not taking: Reported on 07/05/2020)    cyanocobalamin 1000 MCG tablet Take 1,000 mcg by mouth daily. (Patient not taking: Reported on 0/98/1191)    Garlic 478 MG CAPS Take 1 capsule by mouth daily. (Patient not taking: Reported on 11/10/2020)    ibuprofen (ADVIL) 200 MG tablet Take 400 mg by mouth every 6 (six) hours as needed. (Patient not taking: Reported on 03/24/2021)    Omega-3 Fatty Acids (FISH OIL) 1000 MG CAPS Take 1 capsule by mouth daily. (Patient not taking: Reported on 03/24/2021)    rosuvastatin (CRESTOR) 10 MG tablet Take 1 tablet (10 mg total) by mouth daily. (Patient not taking: Reported on 03/24/2021)    valACYclovir (VALTREX) 1000 MG tablet TAKE 2 TABLETS AT ONSET OF COLD SORE FOLLOWED BY SECOND DOSE 12 HOURS LATER. 4 TABS PER BREAKOUT (Patient not taking: Reported on 07/05/2020) 03/24/2021: As needed   No facility-administered encounter medications on file as of 03/24/2021.   NOT taking molnupiravir prior to today's visit  Allergies  Allergen Reactions   Morphine And Related     Patient prefers not to received"became addictive to this many years  ago"- Demerol is okay   ROS:  URI symptoms per HPI. Some diarrhea, not today. No nausea.  +fever/chills, per HPI. Hasn't checked temp today, feels okay. No chest pain, shortness of breath, rash.     Observations/Objective:  Ht 4' 11.5" (1.511 m)    Wt 200 lb (90.7 kg)    BMI 39.72 kg/m   Well-appearing female, in no distress. Speaking comfortably.  No coughing during visit.   Assessment and Plan:  COVID-19 virus  infection - Plan: molnupiravir EUA (LAGEVRIO) 200 mg CAPS capsule  Discussed supportive measures, antivirals, sx of complications of COVID. All questions answered.   Drink plenty of water. Don't use Afrin often, and definitely no more than 2-3 days. I'd prefer that you use sudafed (decongestant) if/when needed for sinus pain. Continue Mucinex DM 12 hour twice daily.  Take the antiviral medication twice daly for 5 days.  You need to isolate Tuesday through Saturday (days 1-5, with day 0 being your first day of symptoms, Monday 1/23).   Days 6-10--if you no longer have fever and your respiratory symptoms are improving, you may leave your house, but must wear mask 100% of the time. Nancy Fetter through [employer] next week) If not improving, isolate for the full 10 days.  Be sure to seek care immediately if you develop shortness of breath, pain with breathing, persistent high fevers, confusion or other neurologic symptoms (weakness, numbness), swollen painful leg, chest pain, or any other concerns.  I hope you feel better soon!    Follow Up Instructions:    I discussed the assessment and treatment plan with the patient. The patient was provided an opportunity to ask questions and all were answered. The patient agreed with the plan and demonstrated an understanding of the instructions.   The patient was advised to call back or seek an in-person evaluation if the symptoms worsen or if the condition fails to improve as anticipated.  I spent 20 minutes dedicated to the care of this patient, including pre-visit review of records, face to face time, post-visit ordering of testing and documentation.    Vikki Ports, MD

## 2021-03-25 ENCOUNTER — Ambulatory Visit: Payer: Medicare Other | Admitting: Cardiology

## 2021-04-21 ENCOUNTER — Other Ambulatory Visit: Payer: Self-pay | Admitting: Family Medicine

## 2021-06-03 ENCOUNTER — Ambulatory Visit: Payer: Medicare Other | Admitting: Cardiology

## 2021-08-01 ENCOUNTER — Ambulatory Visit (HOSPITAL_COMMUNITY)
Admission: EM | Admit: 2021-08-01 | Discharge: 2021-08-01 | Disposition: A | Discharge status: Home / Self Care | Payer: Medicare Other | Attending: Sports Medicine | Admitting: Sports Medicine

## 2021-08-01 ENCOUNTER — Ambulatory Visit (INDEPENDENT_AMBULATORY_CARE_PROVIDER_SITE_OTHER): Payer: Medicare Other

## 2021-08-01 ENCOUNTER — Encounter (HOSPITAL_COMMUNITY): Payer: Self-pay

## 2021-08-01 DIAGNOSIS — S93402A Sprain of unspecified ligament of left ankle, initial encounter: Secondary | ICD-10-CM

## 2021-08-01 DIAGNOSIS — W19XXXA Unspecified fall, initial encounter: Secondary | ICD-10-CM

## 2021-08-01 DIAGNOSIS — M25562 Pain in left knee: Secondary | ICD-10-CM | POA: Diagnosis not present

## 2021-08-01 DIAGNOSIS — M545 Low back pain, unspecified: Secondary | ICD-10-CM | POA: Diagnosis not present

## 2021-08-01 DIAGNOSIS — M1712 Unilateral primary osteoarthritis, left knee: Secondary | ICD-10-CM | POA: Diagnosis not present

## 2021-08-01 DIAGNOSIS — M25572 Pain in left ankle and joints of left foot: Secondary | ICD-10-CM

## 2021-08-01 DIAGNOSIS — M25462 Effusion, left knee: Secondary | ICD-10-CM | POA: Diagnosis not present

## 2021-08-01 MED ORDER — MELOXICAM 15 MG PO TABS: 15.0000 mg | ORAL_TABLET | Freq: Every day | ORAL | 0 refills | Status: AC

## 2021-08-01 MED ORDER — METHOCARBAMOL 500 MG PO TABS: 500.0000 mg | ORAL_TABLET | Freq: Two times a day (BID) | ORAL | 0 refills | Status: DC

## 2021-08-01 NOTE — ED Provider Notes (Signed)
[facility]    CSN: [id number] Arrival date & time: 08/01/21  9470      History   Chief Complaint Chief Complaint  Patient presents with   Fall   Knee Pain    HPI Lauren [name] is a 71 y.o. female here for left knee and ankle pain, left after fall x 2 weeks   Fall Pertinent negatives include no headaches.  Knee Pain Associated symptoms: back pain   Associated symptoms: no fever and no neck pain    Had fall about 2-2.5 weeks ago.  She states she slipped on the floor and fell onto the anterior aspect of the left knee.  She was able to walk on her own power following this but with soreness and bruising of the leg.  She states then 3 days ago she was coming through a sliding door when she turned from the door and felt the left knee gave out and caused her to fall onto the left shoulder.  She did report hitting her head but denied any loss of consciousness or continued headache or dizziness.  She continues with some left knee pain and soreness.  She also has some bruising down the left calf.  Has some tenderness over the medial aspect of the ankle and the ankle feels somewhat weak.  She also states that her low back feels like it has tightened up.  She denies any numbness or tingling down the legs.  Denies any giving way of the legs.  She is not having any headache or dizziness currently.  Ambulating with a pronged cane.  Tx: Tylenol as needed - helps some  Past Medical History:  Diagnosis Date   Arthritis    hips-Right worse and right knee.   COVID-19 05/2019   Facial basal cell cancer    nose (Dr. 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 Rex Kras   CESAREAN SECTION  x2   MOHS SURGERY Left 07/10/2017   LLE for SCC, Dr. Sarajane Jews   REFRACTIVE SURGERY  age 45   [place] Right 01/19/2015   Procedure: TOTAL RIGHT HIP ARTHROPLASTY ANTERIOR APPROACH;  Surgeon: Paralee Cancel, MD;  Location: [facility];  Service: Orthopedics;  Laterality: Right;    OB History     Gravida  4   Para      Term      Preterm      AB  2   Living  2  SAB  2   IAB      Ectopic      Multiple      Live Births               Home Medications    Prior to Admission medications   Medication Sig Start Date End Date Taking? Authorizing Provider  meloxicam (MOBIC) 15 MG tablet Take 1 tablet (15 mg total) by mouth daily for 21 days. 08/01/21 08/22/21 Yes Elba Barman, DO  methocarbamol (ROBAXIN) 500 MG tablet Take 1 tablet (500 mg total) by mouth 2 (two) times daily. 08/01/21  Yes Elba Barman, DO  acetaminophen (TYLENOL) 500 MG tablet Take 500 mg by mouth every 6 (six) hours as needed. Patient not taking: Reported on 08/11/2019    [provider]  aspirin EC 81 MG tablet Take 1 tablet (81 mg total) by mouth daily. Patient not taking: Reported on 03/24/2021 07/02/19   Dorothy Spark, MD  cetirizine (ZYRTEC) 10 MG tablet Take 10 mg by mouth daily as needed for allergies.  Patient not taking: Reported on 07/05/2020    [provider]  cyanocobalamin 1000 MCG  tablet Take 1,000 mcg by mouth daily. Patient not taking: Reported on 03/24/2021    [provider]  dextromethorphan-guaiFENesin (MUCINEX DM) 30-600 MG 12hr tablet Take 1 tablet by mouth 2 (two) times daily.    [provider]  Garlic 250 MG CAPS Take 1 capsule by mouth daily. Patient not taking: Reported on 11/10/2020    [provider]  ibuprofen (ADVIL) 200 MG tablet Take 400 mg by mouth every 6 (six) hours as needed. Patient not taking: Reported on 03/24/2021    [provider]  Omega-3 Fatty Acids (FISH OIL) 1000 MG CAPS Take 1 capsule by mouth daily. Patient not taking: Reported on 03/24/2021    [provider]  rosuvastatin (CRESTOR) 10 MG tablet Take 1 tablet (10 mg total) by mouth daily. Patient not taking: Reported on 03/24/2021 08/13/20   Rita Ohara, MD  valACYclovir (VALTREX) 1000 MG tablet TAKE 2 TABLETS AT ONSET OF COLD SORE FOLLOWED BY SECOND DOSE 12 HOURS LATER. 4 TABS PER BREAKOUT 04/22/21   Rita Ohara, MD    Family History Family History  Problem Relation Age of Onset   Heart disease Mother 41       MI   COPD Mother    Hyperlipidemia Mother    Hypertension Mother    Alcohol abuse Father    Stroke Father 56   Heart disease Sister    Breast cancer Sister 38   Heart disease Sister    Hyperlipidemia Sister    Hypertension Sister    Diabetes Sister    Berenice Primas' disease Sister    COPD Sister    Diabetes Sister    Heart disease Sister    Heart disease Sister        CAD, s/p angioplasties   Diabetes Sister    Hyperlipidemia Sister    Hypertension Sister    Gout Sister    Arthritis Sister    Lupus Sister    Heart disease Brother        30's   Heart disease Brother    COPD Brother    Alcohol abuse Brother    Diabetes Brother        borderline   COPD Brother    Heart disease Son 73       mild heart attack   Stroke Son  pt states stroke, she thinks it was heart attack    Social History Social History   Tobacco  Use   Smoking status: Never   Smokeless tobacco: Never  Vaping Use   Vaping Use: Never used  Substance Use Topics   Alcohol use: Yes    Comment: beer or margarita 3-4/month   Drug use: No     Allergies   Morphine and related   Review of Systems Review of Systems  Constitutional:  Negative for chills and fever.  Musculoskeletal:  Positive for arthralgias (left knee, left ankle) and back pain. Negative for neck pain.  Skin:  Negative for wound.       + bruising of left leg  Neurological:  Negative for dizziness, weakness and headaches.  Hematological:  Does not bruise/bleed easily.    Physical Exam Triage Vital Signs ED Triage Vitals [08/01/21 0914]  Enc Vitals Group     BP (!) 158/84     Pulse Rate 75     Resp 18     Temp (!) 97.5 F (36.4 C)     Temp Source Oral     SpO2 99 %     Weight      Height      Head Circumference      Peak Flow      Pain Score 5     Pain Loc      Pain Edu?      Excl. in [place]?    No data found.  Updated Vital Signs BP (!) 158/84 (BP Location: Left Arm)   Pulse 75   Temp (!) 97.5 F (36.4 C) (Oral)   Resp 18   SpO2 99%    Physical Exam Gen: Well-appearing, in no acute distress; non-toxic CV: Regular Rate. Well-perfused. Warm.  Resp: Breathing unlabored on room air; no wheezing. Psych: Fluid speech in conversation; appropriate affect; normal thought process Neuro: Sensation intact throughout. No gross coordination deficits.  MSK:   - Left knee/leg: Inspection demonstrates some soft tissue swelling and ecchymosis of the anterior aspect of the knee.  I do not feel a gross effusion.  There is TTP over the medial > lateral joint line.  No varus or valgus instability.  Negative Lachman's test.  Range of motion from 0-120 degrees.  There is pain with resisted knee extension, otherwise strength 5/5.  Neurovascular intact distally.  - Left ankle: + TTP over the medial malleolus.  There is pain with resisted inversion/eversion testing.   Negative anterior drawer testing.  Range of motion full in all directions although some pain with plantarflexion.  Walks with slightly antalgic gait.  Neurovascular intact distally.  - Low back: No midline spinous process TTP.  No significant scoliosis or overlying skin changes.  There is some hypertonicity of the bilateral paraspinal muscles, right greater than left.  Negative modified straight leg raise.   UC Treatments / Results  Labs (all labs ordered are listed, but only abnormal results are displayed) Labs Reviewed - No data to display  EKG   Radiology DG Ankle Complete Left  Result Date: 08/01/2021 CLINICAL DATA:  Fall with ankle pain. EXAM: LEFT ANKLE COMPLETE - 3+ VIEW COMPARISON:  None Available. FINDINGS: Three view study. There is no evidence of fracture, dislocation, or joint effusion. There is no evidence of arthropathy or other focal bone abnormality. Soft tissues are unremarkable. IMPRESSION: Negative. Electronically Signed   By: Misty Stanley M.D.   On: 08/01/2021 10:39   DG Knee Complete 4  Views Left  Result Date: 08/01/2021 CLINICAL DATA:  fall, ankle pain medial EXAM: LEFT KNEE - COMPLETE 4+ VIEW COMPARISON:  None Available. FINDINGS: There is articular surface irregularity along the superior patella. No significant joint effusion. There is medial and patellofemoral compartment degenerative change. IMPRESSION: Articular surface irregularity along the superior patella, could be due to degenerative arthritis or sequela of recent trauma. Electronically Signed   By: Maurine Simmering M.D.   On: 08/01/2021 10:41    Procedures Procedures (including critical care time)  Medications Ordered in UC Medications - No data to display  Initial Impression / Assessment and Plan / UC Course  I have reviewed the triage vital signs and the nursing notes.  Pertinent labs & imaging results that were available during my care of the patient were reviewed by me and considered in my medical  decision making (see chart for details).     Patient presents with sequelae of 2 falls over the last 2 weeks.  She has some swelling and bruising over the left knee and left lower extremity.  X-rays demonstrate no acute fracture.  There is some cortical irregularity on the articular side of the superior patella which does seem more degenerative in nature, however cannot rule out acute pathology on x-ray alone.  There is no significant effusion within the knee however.  She also has pain over the medial joint line after twisting injury, likely has medial meniscal involvement.  We will send patient to orthopedics for follow-up, likely MRI of the knee without contrast would be necessary to evaluate for both of these pathologies.  I did provide rehab exercises for the ankle given her sprain.  For her low back pain, she has no red flag symptoms.  We will provide meloxicam 15 mg once daily as well as Robaxin once/twice daily as needed for tightness and spasms.  Did caution on side effects and taking this first to make sure this does not make her drowsy or tired.  Offered knee brace, although she would like to hold off and see orthopedics first.  She is safe for discharge home.  Return precautions provided.  Final Clinical Impressions(s) / UC Diagnoses   Final diagnoses:  Fall, initial encounter  Sprain of left ankle, unspecified ligament, initial encounter  Pain and swelling of left knee  Bilateral low back pain without sciatica, unspecified chronicity     Discharge Instructions      - Would recommend icing the knee for 20 minutes, 2-3 times a day -Begin range of motion and rehab exercises for the ankle -Begin meloxicam 15 mg, take once a day for food.  This is an anti-inflammatory, so avoid taking ibuprofen, Motrin or Aleve with this -You may begin Robaxin 1 tablet at nighttime, if you tolerate this well without feeling dizzy or groggy you may continue it twice a day for back tightness  -Recommend  following up with orthopedic office, call them to set an appointment as it works for you.  They may recommend an MRI of the knee to better evaluate for any evidence of meniscal tear or other pathology     ED Prescriptions     Medication Sig Dispense Auth. Provider   meloxicam (MOBIC) 15 MG tablet Take 1 tablet (15 mg total) by mouth daily for 21 days. 20 tablet Elba Barman, DO   methocarbamol (ROBAXIN) 500 MG tablet Take 1 tablet (500 mg total) by mouth 2 (two) times daily. 28 tablet Elba Barman, DO      PDMP  not reviewed this encounter.   Elba Barman, DO 08/01/21 1116

## 2021-08-01 NOTE — ED Triage Notes (Addendum)
Onset 2 weeks ago pt slip and fell on the floor. C/o left knee pain. She does report hitting the side of her head. No LOC.

## 2021-08-01 NOTE — Discharge Instructions (Addendum)
-   Would recommend icing the knee for 20 minutes, 2-3 times a day -Begin range of motion and rehab exercises for the ankle -Begin meloxicam 15 mg, take once a day for food.  This is an anti-inflammatory, so avoid taking ibuprofen, Motrin or Aleve with this -You may begin Robaxin 1 tablet at nighttime, if you tolerate this well without feeling dizzy or groggy you may continue it twice a day for back tightness  -Recommend following up with orthopedic office, call them to set an appointment as it works for you.  They may recommend an MRI of the knee to better evaluate for any evidence of meniscal tear or other pathology

## 2021-08-03 DIAGNOSIS — S83242A Other tear of medial meniscus, current injury, left knee, initial encounter: Secondary | ICD-10-CM | POA: Diagnosis not present

## 2021-08-14 NOTE — Progress Notes (Unsigned)
No chief complaint on file.   Lauren [name] is a 71 y.o. female who presents for annual physical exam, Medicare wellness visit and follow-up on chronic medical conditions.    She has fallen twice in the last month.  Initially she fell in May (slipped), then earlier this month her L knee gave out, causing her to fall onto her L shoulder.  She was seen in [facility], Xrays done (no acute fractures). Per notes, she was referred to ortho (but no referral is in system), and prescribed meloxicam and robaxin. L knee:  IMPRESSION: Articular surface irregularity along the superior patella, could be due to degenerative arthritis or sequela of recent trauma. L ankle x-ray was negative.  L foot pain--she was referred to Dr. Paulla Dolly 10/2020. She got injection for tendonitis, and plan is for bunion surgery in future.   Last year she reported some depression related to a lot of family stressors (granddaughter's health--lupus, drug use, sister's health, ?heart surgery). Last year stated--She doesn't want to talk to anybody or any medications. "I can deal with it"  H/o R trochanteric bursitis.  Last injected by me 07/2020.  She is s/p THR in 12/2014.  She hasn't seen ortho in many years, despite her complaints of worsening hip pain. Did shot help???  Hyperlipidemia/family history of CAD: She saw Dr. Nelson/cardiology--originally sent for cardiac clearance prior to hip replacement.  She had a normal stress test, and was started on Crestor by Dr. Meda Coffee. She tolerates statin without side effects (as long as she takes it at night, had some dizziness when she wasn't).  She eats very few eggs (2/month), infrequent ice cream, doesn't eat cheese. She has red meat once or twice a month (mostly in spaghetti sauce or lasagna). She is due for recheck. Lab Results  Component Value Date   CHOL 185 08/12/2020   HDL 68 08/12/2020   LDLCALC 87 08/12/2020   TRIG 180 (H) 08/12/2020   CHOLHDL 2.7 08/12/2020   Herpes  Labialis--Uses Valtrex prn.  They tend to be triggered by the sun (and stress). Last refilled in 03/2021.   Immunization History  Administered Date(s) Administered   Fluad Quad(high Dose 65+) 11/10/2020   Influenza Split 11/24/2010   Influenza, High Dose Seasonal PF 10/29/2014, 12/23/2015, 01/11/2017, 12/10/2017   Influenza, Quadrivalent, Recombinant, Inj, Pf 01/09/2019   Influenza,inj,Quad PF,6+ Mos 10/27/2013   Moderna Sars-Covid-2 Vaccination 09/17/2019, 10/15/2019   Pneumococcal Conjugate-13 10/29/2014   Pneumococcal Polysaccharide-23 10/05/2008, 01/06/2016   Td 02/28/2000   Tdap 10/05/2008   Zoster Recombinat (Shingrix) 10/04/2017, 12/17/2017   Zoster, Live 11/24/2010   Last Pap smear: 12/2016, no high risk HPV detected Last mammogram: 10/2020 Last colonoscopy: 07/2016 (scattered diverticula, Dr. Collene Mares) Last DEXA:08/2018 T-0.9 Teola Bradley) Dentist: twice yearly. Ophtho: 5 years Exercise: Not walking outside much recently (too many family members sick, plus heat). 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  No regular weight-bearing exercise.   Patient Care Team: Rita Ohara, MD as PCP - General (Family Medicine) Dentist: [facility] in [place] Dermatologist: Dr. Martin Majestic Podiatrist: Dr. Paulla Dolly Cardiologist: None currently, no longer sees (prev Dr. Meda Coffee, moved away) GI: Dr. Collene Mares Ortho: Dr. Alvan Dame Ophtho: Dr. Gershon Crane  Depression Screening: Flowsheet Row Office Visit from 08/12/2020 in [place]  PHQ-2 Total Score 2       [place] Visit from 08/12/2020 in [place]  PHQ-9 Total Score 5       Falls screen:     11/10/2020    9:56 AM  08/12/2020    9:51 AM 08/11/2019    8:48 AM 06/03/2018    8:40 AM 01/11/2017    8:49 AM  Fall Risk   Falls in the past year? 0 0 0 0 No  Number falls in past yr: 0 0     Injury with Fall? 0 0     Risk for fall due to : No Fall Risks No Fall Risks     Follow up Falls evaluation completed Falls  evaluation completed        Functional Status Survey:        Functional Status Survey:  Notable for slight trouble hearing (certain tones, or certain people who mumble (when they aren't wearing their dentures), worse since people wear masks), leakage of urine (if picks up something heavy), can't hold her urine quite as long, or has urge incontinence; sometimes has trouble with stairs, related to her hip.    End of Life Discussion:  Patient has a living will and medical power of attorney, scanned in chart.   PMH, PSH, SH and FH were reviewed and updated.     ROS: The patient denies anorexia, fever, headaches, vision changes, decreased hearing, ear pain, sore throat, breast concerns, chest pain, palpitations, dizziness, syncope, dyspnea on exertion, cough, swelling, nausea, vomiting, diarrhea, constipation, abdominal pain, melena, hematochezia, indigestion/heartburn, hematuria, dysuria, vaginal bleeding, discharge, odor or itch, genital lesions, numbness, tingling, weakness, tremor, suspicious skin lesions (sees derm yearly), depression, anxiety, abnormal bleeding/bruising, or enlarged lymph nodes.    Some leakage of urine with sneeze/cough, urgency/full bladder. No dysuria Right hip pain. L knee pain, per HPI L ankle pain? Shoulder pain? L foot pain, bunion pain?   +stress and mild depression. +fatigue, not sleeping well--wakes up early, mind starts running, can't get back to sleep.    PHYSICAL EXAM:  There were no vitals taken for this visit.  Wt Readings from Last 3 Encounters:  03/24/21 200 lb (90.7 kg)  11/10/20 201 lb 12.8 oz (91.5 kg)  08/26/20 201 lb 9.6 oz (91.4 kg)    General Appearance:   Alert, cooperative, no distress, appears stated age    Head:   Normocephalic, without obvious abnormality, atraumatic    Eyes:   PERRL, EOM's intact, fundi benign.    Ears:   Normal TM's and external ear canals    Nose:   Normal, no drainage, no sinus tenderness   Throat:    Normal mucosa, no lesions    Neck:   Supple, no lymphadenopathy; thyroid: no enlargement/ tenderness/nodules; no carotid bruit or JVD    Back:   Spine nontender, no curvature, ROM normal, no CVA tenderness    Lungs:   Clear to auscultation bilaterally without wheezes, rales or ronchi; respirations unlabored    Chest Wall:   No tenderness or deformity    Heart:   Regular rate and rhythm, S1 and S2 normal, no murmur, rub or gallop    Breast Exam:   No tenderness, masses, or nipple discharge or inversion. No axillary lymphadenopathy.   Abdomen:   Soft, non-tender, nondistended, normoactive bowel sounds, no masses, no hepatosplenomegaly. WHSS   Genitalia:   Normal external genitalia without lesions. BUS and vagina normal; No abnormal vaginal discharge. Uterus and adnexa nontender, no masses, though exam is somewhat limited due to body habitus. Pap not performed.  Rectal:   Normal tone, no masses or tenderness; guaiac negative stool    Extremities:   No clubbing, cyanosis or edema. WHSS right lateral hip. Tender  at right trochanteric bursa  Pulses:   2+ and symmetric all extremities    Skin:   Skin color, texture, turgor normal, no lesions. Some purpura on forearms, as well as many scars from biopsies.   Lymph nodes:   Cervical, supraclavicular, inguinal and axillary nodes normal    Neurologic:   Normal strength, sensation and gait; reflexes 2+ and symmetric throughout                 Psych:   Mildly depressed mood, full range of affect, normal hygiene and grooming  *** Update if tender at R troch bursa Update if any foot findings, bunions Update mood Update any knee/hip/ankle/shoulder findings SENILE PURPURA??   ASSESSMENT/PLAN:   Recent UC visit, 2 falls in the last 2 months.  Looks like UC said referral to ortho, no referral in system (but pt has ortho)--has she scheduled appt?  Did she ever get TdaP from pharmacy (reminded the last 2 years), or any COVID boosters? Has she seen ophtho  (if not recent, has been 5 years or so) If not taking aspirin, remove from med list  Cbc, c-met, lipids RF crestor after labs back  Discussed monthly self breast exams and yearly mammograms; at least 30 minutes of aerobic activity at least 5 days/week, weight-bearing exercise 2x/week; proper sunscreen use reviewed; healthy diet, including goals of calcium and vitamin D intake and alcohol recommendations (less than or equal to 1 drink/day) reviewed; regular seatbelt use; changing batteries in smoke detectors. Immunization recommendations discussed--continue yearly high dose flu shots. Tetanus booster (Td or TdaP) is past due, to get at pharmacy. Bivalent COVID booster recommended, discussed now and/or waiting until new ones comes out in the Fall (not available in clinic). Colonoscopy recommendations reviewed--UTD.  Pap smears are no longer indicated based on age.    Medicare Attestation I have personally reviewed: The patient's medical and social history Their use of alcohol, tobacco or illicit drugs Their current medications and supplements The patient's functional ability including ADLs,fall risks, home safety risks, cognitive, and hearing and visual impairment Diet and physical activities Evidence for depression or mood disorders  The patient's weight, height, BMI have been recorded in the chart.  I have made referrals, counseling, and provided education to the patient based on review of the above and I have provided the patient with a written personalized care plan for preventive services.

## 2021-08-14 NOTE — Patient Instructions (Incomplete)
  HEALTH MAINTENANCE RECOMMENDATIONS:  It is recommended that you get at least 30 minutes of aerobic exercise at least 5 days/week (for weight loss, you may need as much as 60-90 minutes). This can be any activity that gets your heart rate up. This can be divided in 10-15 minute intervals if needed, but try and build up your endurance at least once a week.  Weight bearing exercise is also recommended twice weekly.  Eat a healthy diet with lots of vegetables, fruits and fiber.  "Colorful" foods have a lot of vitamins (ie green vegetables, tomatoes, red peppers, etc).  Limit sweet tea, regular sodas and alcoholic beverages, all of which has a lot of calories and sugar.  Up to 1 alcoholic drink daily may be beneficial for women (unless trying to lose weight, watch sugars).  Drink a lot of water.  Calcium recommendations are 1200-1500 mg daily (1500 mg for postmenopausal women or women without ovaries), and vitamin D 1000 IU daily.  This should be obtained from diet and/or supplements (vitamins), and calcium should not be taken all at once, but in divided doses.  Monthly self breast exams and yearly mammograms for women over the age of 79 is recommended.  Sunscreen of at least SPF 30 should be used on all sun-exposed parts of the skin when outside between the hours of 10 am and 4 pm (not just when at beach or pool, but even with exercise, golf, tennis, and yard work!)  Use a sunscreen that says "broad spectrum" so it covers both UVA and UVB rays, and make sure to reapply every 1-2 hours.  Remember to change the batteries in your smoke detectors when changing your clock times in the spring and fall. Carbon monoxide detectors are recommended for your home.  Use your seat belt every time you are in a car, and please drive safely and not be distracted with cell phones and texting while driving.   Ms. Harcum , Thank you for taking time to come for your Medicare Wellness Visit. I appreciate your ongoing  commitment to your health goals. Please review the following plan we discussed and let me know if I can assist you in the future.   This is a list of the screening recommended for you and due dates:  Health Maintenance  Topic Date Due   Tetanus Vaccine  10/06/2018   COVID-19 Vaccine (3 - Moderna series) 12/10/2019   Flu Shot  09/27/2021   Mammogram  11/06/2022   Colon Cancer Screening  07/29/2023   Pneumonia Vaccine  Completed   DEXA scan (bone density measurement)  Completed   Hepatitis C Screening: USPSTF Recommendation to screen - Ages 37-79 yo.  Completed   Zoster (Shingles) Vaccine  Completed   HPV Vaccine  Aged Out   You are past due for a tetanus booster.  You need to get this from the pharmacy (TdaP). Bivalent COVID booster is recommended.  We discussed this today.  A newly formulated one should be coming out in the Fall.    Please schedule routine eye exam.

## 2021-08-15 ENCOUNTER — Encounter: Payer: Self-pay | Admitting: Family Medicine

## 2021-08-15 ENCOUNTER — Ambulatory Visit (INDEPENDENT_AMBULATORY_CARE_PROVIDER_SITE_OTHER): Payer: Medicare Other | Admitting: Family Medicine

## 2021-08-15 VITALS — BP 146/70 | HR 64 | Ht 59.0 in | Wt 195.6 lb

## 2021-08-15 DIAGNOSIS — Z23 Encounter for immunization: Secondary | ICD-10-CM | POA: Diagnosis not present

## 2021-08-15 DIAGNOSIS — E7800 Pure hypercholesterolemia, unspecified: Secondary | ICD-10-CM | POA: Diagnosis not present

## 2021-08-15 DIAGNOSIS — R030 Elevated blood-pressure reading, without diagnosis of hypertension: Secondary | ICD-10-CM | POA: Diagnosis not present

## 2021-08-15 DIAGNOSIS — Z5181 Encounter for therapeutic drug level monitoring: Secondary | ICD-10-CM | POA: Diagnosis not present

## 2021-08-15 DIAGNOSIS — Z8249 Family history of ischemic heart disease and other diseases of the circulatory system: Secondary | ICD-10-CM

## 2021-08-15 DIAGNOSIS — Z0000 Encounter for general adult medical examination without abnormal findings: Secondary | ICD-10-CM

## 2021-08-15 DIAGNOSIS — Z6839 Body mass index (BMI) 39.0-39.9, adult: Secondary | ICD-10-CM

## 2021-08-15 DIAGNOSIS — L570 Actinic keratosis: Secondary | ICD-10-CM | POA: Diagnosis not present

## 2021-08-16 LAB — CBC WITH DIFFERENTIAL/PLATELET
Basophils Absolute: 0 10*3/uL (ref 0.0–0.2)
Basos: 1 %
EOS (ABSOLUTE): 0.2 10*3/uL (ref 0.0–0.4)
Eos: 3 %
Hematocrit: 46.5 % (ref 34.0–46.6)
Hemoglobin: 15 g/dL (ref 11.1–15.9)
Immature Grans (Abs): 0 10*3/uL (ref 0.0–0.1)
Immature Granulocytes: 0 %
Lymphocytes Absolute: 1.4 10*3/uL (ref 0.7–3.1)
Lymphs: 27 %
MCH: 29.5 pg (ref 26.6–33.0)
MCHC: 32.3 g/dL (ref 31.5–35.7)
MCV: 92 fL (ref 79–97)
Monocytes Absolute: 0.5 10*3/uL (ref 0.1–0.9)
Monocytes: 9 %
Neutrophils Absolute: 3.1 10*3/uL (ref 1.4–7.0)
Neutrophils: 60 %
Platelets: 273 10*3/uL (ref 150–450)
RBC: 5.08 x10E6/uL (ref 3.77–5.28)
RDW: 12.9 % (ref 11.7–15.4)
WBC: 5.2 10*3/uL (ref 3.4–10.8)

## 2021-08-16 LAB — COMPREHENSIVE METABOLIC PANEL WITH GFR
ALT: 20 IU/L (ref 0–32)
AST: 11 IU/L (ref 0–40)
Albumin/Globulin Ratio: 1.8 (ref 1.2–2.2)
Albumin: 4.6 g/dL (ref 3.7–4.7)
Alkaline Phosphatase: 69 IU/L (ref 44–121)
BUN/Creatinine Ratio: 14 (ref 12–28)
BUN: 11 mg/dL (ref 8–27)
Bilirubin Total: 0.4 mg/dL (ref 0.0–1.2)
CO2: 24 mmol/L (ref 20–29)
Calcium: 9.7 mg/dL (ref 8.7–10.3)
Chloride: 103 mmol/L (ref 96–106)
Creatinine, Ser: 0.77 mg/dL (ref 0.57–1.00)
Globulin, Total: 2.5 g/dL (ref 1.5–4.5)
Glucose: 103 mg/dL — ABNORMAL HIGH (ref 70–99)
Potassium: 5 mmol/L (ref 3.5–5.2)
Sodium: 140 mmol/L (ref 134–144)
Total Protein: 7.1 g/dL (ref 6.0–8.5)
eGFR: 82 mL/min/1.73 (ref >59)

## 2021-08-16 LAB — LIPID PANEL
Chol/HDL Ratio: 2.6 ratio (ref 0.0–4.4)
Cholesterol, Total: 194 mg/dL (ref 100–199)
HDL: 76 mg/dL
LDL Chol Calc (NIH): 95 mg/dL (ref 0–99)
Triglycerides: 135 mg/dL (ref 0–149)
VLDL Cholesterol Cal: 23 mg/dL (ref 5–40)

## 2021-08-16 MED ORDER — ROSUVASTATIN CALCIUM 10 MG PO TABS: 10.0000 mg | ORAL_TABLET | Freq: Every day | ORAL | 3 refills | Status: DC

## 2021-08-22 ENCOUNTER — Other Ambulatory Visit: Payer: Self-pay | Admitting: *Deleted

## 2021-08-23 MED ORDER — MELOXICAM 15 MG PO TABS: 15.0000 mg | ORAL_TABLET | Freq: Every day | ORAL | 0 refills | Status: DC

## 2021-09-19 ENCOUNTER — Encounter: Payer: Medicare Other | Admitting: Family Medicine

## 2021-10-03 ENCOUNTER — Telehealth: Payer: Self-pay

## 2021-10-03 NOTE — Telephone Encounter (Signed)
She was supposed to f/u last month on her blood pressure, and for cortisone shot to her hip (if the meloxicam didn't help the pain).  Looks like she cancelled and didn't r/s. Please r/s and we can do letter for her at visit.

## 2021-10-03 NOTE — Telephone Encounter (Signed)
Pt called and wanted to see if she could get  a hardship letter from her PCP for her mailbox. [place] the other side [place] road [place] and she is unable to move fast do to her hip surgery. The post office said if she could get the letter that will put the mailbox on her side of the street.

## 2021-10-04 NOTE — Telephone Encounter (Signed)
Left pt a Vm to call and schedule an appt

## 2021-10-04 NOTE — Progress Notes (Unsigned)
  No chief complaint on file.   Patient requests a hardship letter to get her mailbox moved to her side of the street. She states that currently her mailbox is on the other side of the road, in a curve, and she is unable to move fast due to her hip surgery. She reports the post office said that a letter from her PCP could get the mailbox moved.  Patient also presents for cortisone injection for trochanteric bursitis. Last evaluated at her CPE in June, with tenderness at R trochanteric bursa.  She has responded well to injections in the past (last was 07/2020). She was advised to schedule visit for injection if the pain persisted despite taking the prescribed course of meloxicam (from the ER). She did get one RF from our office the end of June.  Persistent pain after meloxicam??  She is s/p THR in 12/2014, hasn't seen ortho in many years. She denies any groin pain.    She was noted to have elevated blood pressure at her physical. She was advised to cut back on the sodium in her diet, to try and get daily exercise and work on weight loss.  She was asked to monitor her blood pressures, record the readings, and bring to a 4 week f/u visit.  She cancelled that visit. She was reminded to r/s this when she called asking for a letter for a closer mailbox.     PMH, PSH, SH reviewed   ROS: no fever, chills, URI symptoms, headaches, dizziness, chest pain, shortness of breath. +R hip pain   PHYSICAL EXAM:  There were no vitals taken for this visit.  Wt Readings from Last 3 Encounters:  08/15/21 195 lb 9.6 oz (88.7 kg)  03/24/21 200 lb (90.7 kg)  11/10/20 201 lb 12.8 oz (91.5 kg)   Well-appearing, pleasant female in no distress HEENT: conjunctiva and sclera are clear, EOMI Neck: no lymphadenopathy or mass Heart: regular rate and rhythm Lungs: clear bilaterally Extremities: no edema Tender to palpation overlying right greater trochanter.  No erythema.     Verbal consent was obtained,  understanding the risks and alternatives to the procedure, wishes to proceed. Procedure:  right lateral hip was cleaned with alcohol.  Ethyl chloride spray was used and '20mg'$  of kenalog and 1.5 cc of 2% lido without epi was injected into the trochanteric bursa. She tolerated the procedure without complication. She noted decrease in pain following the procedure   ASSESSMENT/PLAN:   Cortisone shot to R trochanteric IF she has persistent tenderness on exam (She is s/p about 3 weeks of meloxicam in June) If no response, needs to f/u with ortho

## 2021-10-05 ENCOUNTER — Encounter: Payer: Self-pay | Admitting: Family Medicine

## 2021-10-05 ENCOUNTER — Ambulatory Visit (INDEPENDENT_AMBULATORY_CARE_PROVIDER_SITE_OTHER): Payer: Medicare Other | Admitting: Family Medicine

## 2021-10-05 VITALS — BP 140/80 | HR 71 | Wt 192.4 lb

## 2021-10-05 DIAGNOSIS — I10 Essential (primary) hypertension: Secondary | ICD-10-CM | POA: Diagnosis not present

## 2021-10-05 DIAGNOSIS — M7061 Trochanteric bursitis, right hip: Secondary | ICD-10-CM | POA: Diagnosis not present

## 2021-10-05 MED ORDER — HYDROCHLOROTHIAZIDE 12.5 MG PO CAPS: 12.5000 mg | ORAL_CAPSULE | Freq: Every day | ORAL | 0 refills | Status: DC

## 2021-10-05 NOTE — Patient Instructions (Addendum)
Please cut back on the chinese food (soy sauce and egg rolls have a lot of sodium). Fried foods tend to have a lot of sodium Read the labels on any canned foods, or store-bought broths. Limit processed meats (bacon/ham/sausage, hot dogs, etc). Read the labels and try and get unsalted crackers.  Record your blood pressure on the sheet provided. Bring this sheet and your home blood pressure monitor to a visit in 4-6 weeks. Be sure to stay well hydrated. Try and get some potassium-rich foods in your diet (especially if you develop any muscle cramps or spasms)--1/2 banana.

## 2021-10-06 ENCOUNTER — Encounter: Payer: Self-pay | Admitting: Family Medicine

## 2021-11-02 ENCOUNTER — Encounter: Payer: Self-pay | Admitting: Internal Medicine

## 2021-11-08 NOTE — Progress Notes (Unsigned)
No chief complaint on file.  Patient presents for follow-up on her blood pressure and for injection for trochanteric bursitis.  Hypertension follow-up: She was started on HCTZ 12.5 mg at her visit last month. She was counseled on low sodium diet (to cut back on [place] food, soy sauce, salted crackers, egg rolls, read labels, especially with broths, canned foods). She was encouraged to exercise daily and to continue weight loss efforts. She was asked to monitor her BP regularly, record on the provided log, and to bring her new monitor and list of BP's to visit today.  BP's are running  She denies headaches, dizziness, chest pain, muscle cramps, edema.   Patient also presents for cortisone injection for right trochanteric bursitis. She has responded well to injections in the past (last was 07/2020).     PMH, PSH, SH reviewed    ROS: no fever, chills, URI symptoms, headaches, dizziness, chest pain, shortness of breath, edema, muscle cramps. +R hip pain    PHYSICAL EXAM:  There were no vitals taken for this visit.  Wt Readings from Last 3 Encounters:  10/05/21 192 lb 6.4 oz (87.3 kg)  08/15/21 195 lb 9.6 oz (88.7 kg)  03/24/21 200 lb (90.7 kg)   Well-appearing, pleasant female in no distress HEENT: conjunctiva and sclera are clear, EOMI Heart: regular rate and rhythm Lungs: clear bilaterally Extremities: no edema Tender to palpation overlying right greater trochanter.    Verbal consent was obtained, understanding the risks and alternatives to the procedure, wishes to proceed.  Procedure:  right lateral hip was cleaned with alcohol.  Ethyl chloride spray was used and 84m of kenalog and 1.5 cc of 2% lido without epi was injected into the trochanteric bursa. She tolerated the procedure without complication. She noted decrease in pain following the procedure   ASSESSMENT/PLAN:   Pr drain/inject large joint/bursa  B-met  RF HCTZ not needed until November, unless dose  changed

## 2021-11-09 ENCOUNTER — Ambulatory Visit (INDEPENDENT_AMBULATORY_CARE_PROVIDER_SITE_OTHER): Payer: Medicare Other | Admitting: Family Medicine

## 2021-11-09 ENCOUNTER — Encounter: Payer: Self-pay | Admitting: Family Medicine

## 2021-11-09 VITALS — BP 132/74 | HR 80 | Ht 59.0 in | Wt 192.4 lb

## 2021-11-09 DIAGNOSIS — R32 Unspecified urinary incontinence: Secondary | ICD-10-CM

## 2021-11-09 DIAGNOSIS — I10 Essential (primary) hypertension: Secondary | ICD-10-CM | POA: Diagnosis not present

## 2021-11-09 DIAGNOSIS — M7061 Trochanteric bursitis, right hip: Secondary | ICD-10-CM

## 2021-11-09 DIAGNOSIS — Z23 Encounter for immunization: Secondary | ICD-10-CM

## 2021-11-09 MED ORDER — TRIAMCINOLONE ACETONIDE 40 MG/ML IJ SUSP
20.0000 mg | Freq: Once | INTRAMUSCULAR | Status: AC
  Administered 2021-11-09: 20 mg via INTRAMUSCULAR

## 2021-11-09 MED ORDER — LIDOCAINE HCL 2 % IJ SOLN
1.5000 mL | Freq: Once | INTRAMUSCULAR | Status: AC
  Administered 2021-11-09: 30 mg via INTRADERMAL

## 2021-11-09 NOTE — Patient Instructions (Signed)
  Continue low sodium diet. Try and get at least 30 minutes of aerobic exercise daily (where you keep the heart rate elevated for at least 10-15 minutes at a time). Keep working on losing weight.  We elected to stop the HCTZ since you weren't using it regularly and your blood pressures seem to have improved. Please continue to monitor regularly, keeping the "comments" filled with reasons why BP might be higher or lower than usual (stress, exercise, pain, etc). Mail or drop off or send a photo of your list via MyChart in 1-2 months.

## 2021-11-28 ENCOUNTER — Encounter: Payer: Self-pay | Admitting: Family Medicine

## 2021-11-28 ENCOUNTER — Telehealth (INDEPENDENT_AMBULATORY_CARE_PROVIDER_SITE_OTHER): Payer: Medicare Other | Admitting: Family Medicine

## 2021-11-28 VITALS — Temp 98.7°F | Ht 59.0 in | Wt 187.0 lb

## 2021-11-28 DIAGNOSIS — I10 Essential (primary) hypertension: Secondary | ICD-10-CM

## 2021-11-28 DIAGNOSIS — J3489 Other specified disorders of nose and nasal sinuses: Secondary | ICD-10-CM | POA: Diagnosis not present

## 2021-11-28 DIAGNOSIS — R052 Subacute cough: Secondary | ICD-10-CM | POA: Diagnosis not present

## 2021-11-28 MED ORDER — AZITHROMYCIN 250 MG PO TABS: ORAL_TABLET | ORAL | 0 refills | Status: DC

## 2021-11-28 MED ORDER — BENZONATATE 200 MG PO CAPS: 200.0000 mg | ORAL_CAPSULE | Freq: Three times a day (TID) | ORAL | 0 refills | Status: DC | PRN

## 2021-11-28 NOTE — Patient Instructions (Signed)
Please resume your HCTZ for your high blood pressure, especially since you can't monitor it to tell if it is high or normal.  Restart your zyrtec daily until your congestion and cough is better (sometimes allergies can be the cause of the cough). Drink plenty of water (goal is to have very pale/clear urine). I recommend taking Mucinex DM 12 hour twice daily. Consider trying sinus rinses once or twice daily. (Neti-pot, or sinus rinse kit--use saline, made with distilled or boiled water, not tap water). If in 24 hours your symptoms aren't any better, or if seeing discolored mucus or phlegm, then start the antibiotics. You can use the benzonatate if needed for cough, if the Mucinex DM isn't taking care of it.  Remember that you take the antibiotic for only 5 days, but it continues to work, for a total of 10 days. Contact us on day 10 or 11 if your cough hasn't improved (sooner if your symptoms are worse--fever, persistently discolored mucus or phlegm, shortness of breath, pain with breathing).  I hope you feel better soon!

## 2021-11-28 NOTE — Progress Notes (Signed)
Start time: 4:05 End time: 4:23  Virtual Visit via Video Note  I connected with Lauren [name] on 11/28/21 by a video enabled telemedicine application and verified that I am speaking with the correct person using two identifiers.  Location: Patient: home Provider: office   I discussed the limitations of evaluation and management by telemedicine and the availability of in person appointments. The patient expressed understanding and agreed to proceed.  History of Present Illness:  Chief Complaint  Patient presents with   Cough    Cough x 2 weeks, negative covid tests x 2. No fever body aches or chills. Cough in worse at night and having hard time sleeping.     Cough x 2-3 weeks, getting progressively worse.  It is mostly nonproductive.  Sometimes she is able to get up phlegm (milky-white, not yellow-green).  She is coughing more at night. She has a slight sore throat, thinks from coughing, and from sleeping with her mouth open--feels better after a cup of coffee). She has been waking up with frontal headaches, also thinks this may be from coughing.  Denies nasal drainage. Sometimes she feels congested/stuffy, but nothing comes out when she blows.  Denies sneezing, itchy/watery eyes.  PMH, PSH, SH reviewed Isn't taking any meds except her statin--she already feels bad. Not taking HCTZ Son checked his BP with her monitor, told her he thinks it isn't working right.  Outpatient Encounter Medications as of 11/28/2021  Medication Sig Note   rosuvastatin (CRESTOR) 10 MG tablet Take 1 tablet (10 mg total) by mouth daily.    acetaminophen (TYLENOL) 500 MG tablet Take 500 mg by mouth every 6 (six) hours as needed. (Patient not taking: Reported on 10/05/2021) 03/24/2021: As needed   cetirizine (ZYRTEC) 10 MG tablet Take 10 mg by mouth daily as needed for allergies. (Patient not taking: Reported on 10/05/2021) 08/15/2021: Very rarely   cyanocobalamin 1000 MCG tablet Take 1,000 mcg by mouth daily.  (Patient not taking: Reported on 11/09/2021) 11/09/2021: Has not taken in the last few days   Garlic 446 MG CAPS Take 1 capsule by mouth daily. (Patient not taking: Reported on 11/09/2021) 11/09/2021: Has not taken in the last few days   hydrochlorothiazide (MICROZIDE) 12.5 MG capsule Take 1 capsule (12.5 mg total) by mouth daily. (Patient not taking: Reported on 11/09/2021) 11/09/2021: Taking when she isn't busy or driving due to having to use restroom   ibuprofen (ADVIL) 200 MG tablet Take 400 mg by mouth every 6 (six) hours as needed. (Patient not taking: Reported on 10/05/2021) 08/15/2021: prn   meloxicam (MOBIC) 15 MG tablet Take 1 tablet (15 mg total) by mouth daily. (Patient not taking: Reported on 10/05/2021)    methocarbamol (ROBAXIN) 500 MG tablet Take 1 tablet (500 mg total) by mouth 2 (two) times daily. (Patient not taking: Reported on 08/15/2021)    Omega-3 Fatty Acids (FISH OIL) 1000 MG CAPS Take 1 capsule by mouth daily. (Patient not taking: Reported on 11/28/2021)    valACYclovir (VALTREX) 1000 MG tablet TAKE 2 TABLETS AT ONSET OF COLD SORE FOLLOWED BY SECOND DOSE 12 HOURS LATER. 4 TABS PER BREAKOUT (Patient not taking: Reported on 10/05/2021) 08/15/2021: As needed   No facility-administered encounter medications on file as of 11/28/2021.   Allergies  Allergen Reactions   Morphine And Related     Patient prefers not to received"became addictive to this many years ago"- Demerol is okay    ROS: no fever, chills.  +headaches and cough per HPI. No  n/v/d. Hip pain is much better since injection for bursitis.   Observations/Objective:  Temp 98.7 F (37.1 C) (Temporal)   Ht '4\' 11"'  (1.499 m)   Wt 187 lb (84.8 kg)   BMI 37.77 kg/m   Pleasant female, with frequent periods of dry cough during visit.  She is speaking comfortably and in no distress. She is alert and oriented.  Cranial nerves are grossly intact, conjunctiva is clear. Exam is limited due to virtual nature of the visit. Normal mood,  grooming.  Assessment and Plan:  Subacute cough - Plan: azithromycin (ZITHROMAX) 250 MG tablet, benzonatate (TESSALON) 200 MG capsule  Frontal sinus pain - Plan: azithromycin (ZITHROMAX) 250 MG tablet  Essential hypertension - encouraged her to resume her HCTZ (esp since she currently can't monitor her BP at home)  Please resume your HCTZ for your high blood pressure, especially since you can't monitor it to tell if it is high or normal.  Restart your zyrtec daily until your congestion and cough is better (sometimes allergies can be the cause of the cough). Drink plenty of water (goal is to have very pale/clear urine). I recommend taking Mucinex DM 12 hour twice daily. Consider trying sinus rinses once or twice daily. (Neti-pot, or sinus rinse kit--use saline, made with distilled or boiled water, not tap water). If in 24 hours your symptoms aren't any better, or if seeing discolored mucus or phlegm, then start the antibiotics. You can use the benzonatate if needed for cough, if the Mucinex DM isn't taking care of it.  Remember that you take the antibiotic for only 5 days, but it continues to work, for a total of 10 days. Contact us on day 10 or 11 if your cough hasn't improved (sooner if your symptoms are worse--fever, persistently discolored mucus or phlegm, shortness of breath, pain with breathing)   Follow Up Instructions:    I discussed the assessment and treatment plan with the patient. The patient was provided an opportunity to ask questions and all were answered. The patient agreed with the plan and demonstrated an understanding of the instructions.   The patient was advised to call back or seek an in-person evaluation if the symptoms worsen or if the condition fails to improve as anticipated.  I spent 20 minutes dedicated to the care of this patient, including pre-visit review of records, face to face time, post-visit ordering of testing and documentation.    Vikki Ports,  MD

## 2021-12-02 DIAGNOSIS — Z1231 Encounter for screening mammogram for malignant neoplasm of breast: Secondary | ICD-10-CM | POA: Diagnosis not present

## 2021-12-02 LAB — HM MAMMOGRAPHY

## 2021-12-05 ENCOUNTER — Encounter: Payer: Self-pay | Admitting: *Deleted

## 2021-12-06 ENCOUNTER — Encounter: Payer: Self-pay | Admitting: Family Medicine

## 2021-12-06 ENCOUNTER — Encounter: Payer: Self-pay | Admitting: Internal Medicine

## 2021-12-27 DIAGNOSIS — L570 Actinic keratosis: Secondary | ICD-10-CM | POA: Diagnosis not present

## 2021-12-27 DIAGNOSIS — D0462 Carcinoma in situ of skin of left upper limb, including shoulder: Secondary | ICD-10-CM | POA: Diagnosis not present

## 2022-01-02 ENCOUNTER — Other Ambulatory Visit: Payer: Self-pay | Admitting: Family Medicine

## 2022-01-02 DIAGNOSIS — I10 Essential (primary) hypertension: Secondary | ICD-10-CM

## 2022-01-06 ENCOUNTER — Encounter: Payer: Self-pay | Admitting: Family Medicine

## 2022-02-27 HISTORY — PX: OTHER SURGICAL HISTORY: SHX169

## 2022-03-08 DIAGNOSIS — H2513 Age-related nuclear cataract, bilateral: Secondary | ICD-10-CM | POA: Diagnosis not present

## 2022-03-08 DIAGNOSIS — H00022 Hordeolum internum right lower eyelid: Secondary | ICD-10-CM | POA: Diagnosis not present

## 2022-03-20 DIAGNOSIS — H0012 Chalazion right lower eyelid: Secondary | ICD-10-CM | POA: Diagnosis not present

## 2022-04-10 DIAGNOSIS — Z96641 Presence of right artificial hip joint: Secondary | ICD-10-CM | POA: Diagnosis not present

## 2022-04-10 DIAGNOSIS — M25562 Pain in left knee: Secondary | ICD-10-CM | POA: Diagnosis not present

## 2022-04-10 DIAGNOSIS — M7061 Trochanteric bursitis, right hip: Secondary | ICD-10-CM | POA: Diagnosis not present

## 2022-04-11 NOTE — Progress Notes (Deleted)
Cardiology Office Note:    Date:  04/11/2022   ID:  Lauren [name], Lauren [name] 07-08-1950, MRN [id number]  PCP:  Rita Ohara, [place] Providers Cardiologist:  None { Click to update primary MD,subspecialty MD or APP then REFRESH:1}    Referring MD: Rita Ohara, MD   No chief complaint on file. ***  History of Present Illness:    Lauren [name] is a 72 y.o. female with a hx of ***  Past Medical History:  Diagnosis Date   Arthritis    hips-Right worse and right knee.   COVID-19 05/2019   Facial basal cell cancer    nose (Dr. Ubaldo Glassing), leg   Herpes simplex labialis    Hyperlipidemia    Melanoma ([place])    right shoulder- 10 yrs ago- no further issues   SCC (squamous cell carcinoma) 11/16/2016   left leg, 12/2018 RLE   Squamous cell skin cancer    many sites (hands/legs)    Past Surgical History:  Procedure Laterality Date   APPENDECTOMY     CARDIOVASCULAR STRESS TEST  06-2005   Dr. Rex Kras   CESAREAN SECTION  x2   MOHS SURGERY Left 07/10/2017   LLE for SCC, Dr. Sarajane Jews   REFRACTIVE SURGERY  age 47   [facility] Right 01/19/2015   Procedure: TOTAL RIGHT HIP ARTHROPLASTY ANTERIOR APPROACH;  Surgeon: Paralee Cancel, MD;  Location: [facility];  Service: Orthopedics;  Laterality: Right;    Current Medications: No outpatient medications have been marked as taking for the 04/14/22 encounter (Appointment) with Freada Bergeron, MD.     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 Both sons live in [place]. 2 grandchildren, and 3 great-grandchildren who "practically live with her" (live down the street).  She is very active with them (taking them places, caring for them).   She is "semi-retired". Plans to retire 08/26/21         Updated 07/2021   Social Determinants of Health   Financial Resource Strain: Not on file  Food Insecurity: Not on file  Transportation Needs: Not on file  Physical Activity: Not on file  Stress: Not on file  Social Connections: Not on file     Family History: The patient's ***family history includes Alcohol abuse in her brother and father; Arthritis in her sister; Breast cancer (age of onset: 63) in her sister; COPD in her brother, brother, mother, and sister; Diabetes in her brother, sister, sister, and sister; Gout in her sister; Berenice Primas' disease in her sister; Heart disease in her brother, brother, sister, sister, sister, and sister; Heart disease (age of onset: 79) in her son; Heart disease (age of onset: 89) in her  mother; Hyperlipidemia in her mother, sister, and sister; Hypertension in her mother, sister, and sister; Lupus in her sister; Stroke in her son; Stroke (age of onset: 59) in her father.  ROS:   Please see the history of present illness.    *** All other systems reviewed and are negative.  EKGs/Labs/Other Studies Reviewed:    The following studies were reviewed today: ***  EKG:  EKG is *** ordered today.  The ekg ordered today demonstrates ***  Recent Labs: 08/15/2021: ALT 20; BUN 11; Creatinine, Ser 0.77; Hemoglobin 15.0; Platelets 273; Potassium 5.0; Sodium 140  Recent Lipid Panel    Component Value Date/Time   CHOL 194 08/15/2021 0947   TRIG 135 08/15/2021 0947   HDL 76 08/15/2021 0947   CHOLHDL 2.6 08/15/2021 0947   CHOLHDL 2.1 01/11/2017 1003   VLDL 31 (H)  01/06/2016 0855   LDLCALC 95 08/15/2021 0947   LDLCALC 100 (H) 01/11/2017 1003     Risk Assessment/Calculations:   {Does this patient have ATRIAL FIBRILLATION?[phone]  No BP recorded.  {Refresh Note OR Click here to enter BP  :1}***         Physical Exam:    VS:  There were no vitals taken for this visit.    Wt Readings from Last 3 Encounters:  11/28/21 187 lb (84.8 kg)  11/09/21 192 lb 6.4 oz (87.3 kg)  10/05/21 192 lb 6.4 oz (87.3 kg)     GEN: *** Well nourished, well developed in no acute distress HEENT: Normal NECK: No JVD; No carotid bruits LYMPHATICS: No lymphadenopathy CARDIAC: ***RRR, no murmurs, rubs, gallops RESPIRATORY:  Clear to auscultation without rales, wheezing or rhonchi  ABDOMEN: Soft, non-tender, non-distended MUSCULOSKELETAL:  No edema; No deformity  SKIN: Warm and dry NEUROLOGIC:  Alert and oriented x 3 PSYCHIATRIC:  Normal affect   ASSESSMENT:    No diagnosis found. PLAN:    In order of problems listed above:  ***      {Are you ordering a CV Procedure (e.g. stress test, cath, DCCV, TEE, etc)?   Press F2        :[id number]    Medication Adjustments/Labs and Tests Ordered: Current medicines are reviewed at length with the patient today.  Concerns regarding medicines are outlined above.  No orders of the defined types were placed in this encounter.  No orders of the defined types were placed in this encounter.   There are no Patient Instructions on file for this visit.   Signed, Freada Bergeron, MD  04/11/2022 8:39 PM    [place]

## 2022-04-14 ENCOUNTER — Ambulatory Visit: Payer: Medicare Other | Admitting: Cardiology

## 2022-05-30 DIAGNOSIS — L570 Actinic keratosis: Secondary | ICD-10-CM | POA: Diagnosis not present

## 2022-07-05 DIAGNOSIS — M25562 Pain in left knee: Secondary | ICD-10-CM | POA: Diagnosis not present

## 2022-07-05 DIAGNOSIS — M7061 Trochanteric bursitis, right hip: Secondary | ICD-10-CM | POA: Diagnosis not present

## 2022-07-25 DIAGNOSIS — C44722 Squamous cell carcinoma of skin of right lower limb, including hip: Secondary | ICD-10-CM | POA: Diagnosis not present

## 2022-07-25 DIAGNOSIS — C44729 Squamous cell carcinoma of skin of left lower limb, including hip: Secondary | ICD-10-CM | POA: Diagnosis not present

## 2022-07-25 DIAGNOSIS — L570 Actinic keratosis: Secondary | ICD-10-CM | POA: Diagnosis not present

## 2022-07-25 DIAGNOSIS — L820 Inflamed seborrheic keratosis: Secondary | ICD-10-CM | POA: Diagnosis not present

## 2022-08-16 ENCOUNTER — Encounter: Payer: Self-pay | Admitting: Family Medicine

## 2022-08-16 NOTE — Progress Notes (Signed)
Chief Complaint  Patient presents with   Medicare Wellness    Lauren [name] is a 72 y.o. female who presents for annual physical exam, Medicare wellness visit and follow-up on chronic medical conditions.    She retired last year. 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 She is spending more time with her sisters.  She had 2 recent losses (a sister in March, and another family member), another sister was diagnosed with lung cancer. She has been under a lot of stress related to these things.  Hypertension follow-up: She was started on HCTZ 12.5 mg in 09/2021.  At her f/u visit in September she reported not taking hydrochlorothiazide every day, only when she stayed home (due to frequency and incontinence). Due to her side effects, and improvement in BP's (despite rarely taking the hydrochlorothiazide), we elected to stop the hydrochlorothiazide, and just continue to monitor her BP, continue to limit salt intake. At that time she reported she had cut back on sausage, bacon, [place] food.  She still limits these.  Hydrochlorothiazide refill was requested in 12/2021, and was filled x 6 mos.  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  She reports she took it when stressed, took it some after the deaths of family members, but not on a regular basis. She didn't ever check her BP, denied headaches, just took it when she felt stressed, thinking it must be high.  She hasn't taken any hydrochlorothiazide in 2-3 months. She denies headaches, dizziness, chest pain. She recalls having a lot of muscle cramps while taking the medication, ate more bananas, which helped. No longer taking medication, no longer having any cramps/spasms.  BP Readings from Last 3 Encounters:  08/17/22 138/70  11/09/21 132/74  10/05/21 (!) 140/80   H/o R trochanteric bursitis.  Last injected by me 10/2021. She saw Dr. Charlann Boxer in 03/2022 and 06/2022, and had R trochanteric and L knee  injections both times. These have been helpful, with no recurrent pain yet. R hip pain flares when she walks long distances. She hasn't been walking much (she used to walk a lot as part of her job).  Hyperlipidemia/family history of CAD: She saw Dr. Nelson/cardiology--originally sent for cardiac clearance prior to hip replacement.  She had a normal stress test, and was started on Crestor by Dr. Delton See. She tolerates statin without side effects (as long as she takes it at night, had some dizziness when she wasn't).    She hasn't taken fish oil in 6 months. She eats very few eggs (2/month), infrequent ice cream, doesn't eat cheese. She has red meat once or twice a month (mostly in spaghetti sauce or lasagna). She is due for recheck. Lab Results  Component Value Date   CHOL 194 08/15/2021   HDL 76 08/15/2021   LDLCALC 95 08/15/2021   TRIG 135 08/15/2021   CHOLHDL 2.6 08/15/2021   Herpes Labialis--Uses Valtrex prn.  They tend to be triggered by the sun (and stress). Last refilled in 03/2021. Hasn't needed it in a while, needs refill.  Immunization History  Administered Date(s) Administered   Fluad Quad(high Dose 65+) 11/10/2020, 11/09/2021   Influenza Split 11/24/2010   Influenza, High Dose Seasonal PF 10/29/2014, 12/23/2015, 01/11/2017, 12/10/2017   Influenza, Quadrivalent, Recombinant, Inj, Pf 01/09/2019   Influenza,inj,Quad PF,6+ Mos 10/27/2013   Moderna Sars-Covid-2 Vaccination 09/17/2019, 10/15/2019   Pneumococcal Conjugate-13 10/29/2014   Pneumococcal Polysaccharide-23 10/05/2008, 01/06/2016   Td  02/28/2000   Tdap 10/05/2008   Zoster Recombinat (Shingrix) 10/04/2017, 12/17/2017, 10/12/2021   Zoster, Live 11/24/2010   Last Pap smear: 12/2016, no high risk HPV detected Last mammogram: 11/2021 Last colonoscopy: 07/2016 (scattered diverticula, Dr. Loreta Ave).  Last DEXA:08/2018 T-0.9 [name] Dentist: twice yearly Ophtho: saw Dr. Nile Riggs in January 2024 Exercise: None. No longer working,  so not walking as much on the job. No regular weight-bearing exercise.    Patient Care Team: Joselyn Arrow, MD as PCP - General (Family Medicine) Dentist: [facility] in [place] Dermatologist: Dr. Roderic Scarce (looking for new one; last saw her 11/2021) Podiatrist: Dr. Charlsie Merles Cardiologist: None currently, no longer sees (prev Dr. Delton See, moved away); cancelled appt with Dr. Shari Prows 03/2022 GI: Dr. Vernell Barrier reports she will not go back to see her, would like to go elsewhere if GI is needed. Ortho: Dr. Charlann Boxer (hip) Ophtho: Dr. Nile Riggs  Depression Screening: Flowsheet Row Office Visit from 08/17/2022 in [facility]  PHQ-2 Total Score 1       Falls screen:     08/17/2022   11:17 AM 11/09/2021   11:13 AM 08/15/2021    8:37 AM 11/10/2020    9:56 AM 08/12/2020    9:51 AM  Fall Risk   Falls in the past year? 0 1 1 0 0  Number falls in past yr: 0 1 1 0 0  Comment  5/23-tripped at car lot and hurt L knee (torn meniscus) 6/23 -slipped at friends house and hurt L shoulder 07/14/21 and 07/17/21    Injury with Fall? 0 1 1 0 0  Comment   5/18/23fell at car lot, missed a step and fell on cement (hurt left knee)bruising 07/17/21 hurt L knee and L shoulder (torn meniscus-did not need surgery    Risk for fall due to : No Fall Risks History of fall(s) History of fall(s) No Fall Risks No Fall Risks  Follow up Falls evaluation completed Falls evaluation completed Falls evaluation completed Falls evaluation completed Falls evaluation completed     Functional Status Survey: Is the patient deaf or have difficulty hearing?: Yes Does the patient have difficulty seeing, even when wearing glasses/contacts?: No Does the patient have difficulty concentrating, remembering, or making decisions?: No Does the patient have difficulty walking or climbing stairs?: No Does the patient have difficulty dressing or bathing?: No Does the patient have difficulty doing errands alone such as visiting a doctor's  office or shopping?: No  Mini-Cog Scoring: 5    End of Life Discussion:  Patient has a living will and medical power of attorney, scanned in chart.   PMH, PSH, SH and FH were reviewed and updated.  Outpatient Encounter Medications as of 08/17/2022  Medication Sig Note   acetaminophen (TYLENOL) 500 MG tablet Take 500 mg by mouth every 6 (six) hours as needed. 08/17/2022: prn     cetirizine (ZYRTEC) 10 MG tablet Take 10 mg by mouth daily as needed for allergies. 08/17/2022: prn    cyanocobalamin 1000 MCG tablet Take 1,000 mcg by mouth daily. 08/17/2022: occasionally     ibuprofen (ADVIL) 200 MG tablet Take 400 mg by mouth every 6 (six) hours as needed. 08/17/2022: prn    Omega-3 Fatty Acids (FISH OIL) 1000 MG CAPS Take 1 capsule by mouth daily.    rosuvastatin (CRESTOR) 10 MG tablet Take 1 tablet (10 mg total) by mouth daily.    Garlic 500 MG CAPS Take 1 capsule by mouth daily. (Patient not taking: Reported on 11/09/2021) 11/09/2021: Has not taken  in the last few days   hydrochlorothiazide (MICROZIDE) 12.5 MG capsule TAKE 1 CAPSULE BY MOUTH EVERY DAY (Patient not taking: Reported on 08/17/2022) 08/17/2022: Stopped taking 2 months ago due to having leg cramps   meloxicam (MOBIC) 15 MG tablet Take 1 tablet (15 mg total) by mouth daily. (Patient not taking: Reported on 10/05/2021)    methocarbamol (ROBAXIN) 500 MG tablet Take 1 tablet (500 mg total) by mouth 2 (two) times daily. (Patient not taking: Reported on 08/15/2021)    valACYclovir (VALTREX) 1000 MG tablet TAKE 2 TABLETS AT ONSET OF COLD SORE FOLLOWED BY SECOND DOSE 12 HOURS LATER. 4 TABS PER BREAKOUT    [DISCONTINUED] azithromycin (ZITHROMAX) 250 MG tablet Take 2 tablets by mouth on first day, then 1 tablet by mouth on days 2 through 5    [DISCONTINUED] benzonatate (TESSALON) 200 MG capsule Take 1 capsule (200 mg total) by mouth 3 (three) times daily as needed for cough.    [DISCONTINUED] valACYclovir (VALTREX) 1000 MG tablet TAKE 2 TABLETS AT  ONSET OF COLD SORE FOLLOWED BY SECOND DOSE 12 HOURS LATER. 4 TABS PER BREAKOUT (Patient not taking: Reported on 10/05/2021) 08/15/2021: As needed   No facility-administered encounter medications on file as of 08/17/2022.   Allergies  Allergen Reactions   Morphine And Codeine     Patient prefers not to received"became addictive to this many years ago"- Demerol is okay   ROS: The patient denies anorexia, fever, headaches, vision changes, ear pain, sore throat, breast concerns, chest pain, palpitations, dizziness, syncope, dyspnea on exertion, cough, swelling, nausea, vomiting, diarrhea, constipation, abdominal pain, melena, hematochezia, indigestion/heartburn, hematuria, dysuria, vaginal bleeding, discharge, odor or itch, genital lesions, numbness, tingling, weakness, tremor, suspicious skin lesions, depression, anxiety, abnormal bleeding/bruising, or enlarged lymph nodes.    Some leakage of urine with sneeze/cough. No dysuria Right hip pain is better now  (last injection 06/2022) L knee pain is better now  (last injection 06/2022) No longer has pain in feet, since walking less Some trouble sleeping, stays up later, no change from last year.  Sleeps 4-5 hours, no naps. A lot of stress/worry/grief since she retired and had family losses. Some decreased hearing     PHYSICAL EXAM:  BP 138/70 (BP Location: Left Arm, Cuff Size: Large)   Pulse 68   Temp 98 F (36.7 C) (Oral)   Resp 16   Ht 4' 10.75" (1.492 m)   Wt 193 lb 12.8 oz (87.9 kg)   SpO2 95% Comment: room air  BMI 39.48 kg/m   Wt Readings from Last 3 Encounters:  08/17/22 193 lb 12.8 oz (87.9 kg)  11/28/21 187 lb (84.8 kg)  11/09/21 192 lb 6.4 oz (87.3 kg)    General Appearance:   Alert, cooperative, no distress, appears stated age. Noted to not hear as well, needed to repeat things to her    Head:   Normocephalic, without obvious abnormality, atraumatic    Eyes:   PERRL, EOM's intact, fundi benign.    Ears:   Normal TM's and  external ear canals    Nose:   Normal, no drainage, no sinus tenderness   Throat:   Normal mucosa, no lesions    Neck:   Supple, no lymphadenopathy; thyroid: no enlargement/ tenderness/nodules; no carotid bruit or JVD    Back:   Spine nontender, no curvature, ROM normal, no CVA tenderness    Lungs:   Clear to auscultation bilaterally without wheezes, rales or ronchi; respirations unlabored    Chest Wall:  No tenderness or deformity    Heart:   Regular rate and rhythm, S1 and S2 normal, no murmur, rub or gallop    Breast Exam:   No tenderness, masses, or nipple discharge or inversion. No axillary lymphadenopathy.   Abdomen:   Soft, non-tender, nondistended, normoactive bowel sounds, no masses, no hepatosplenomegaly. WHSS   Genitalia:   Not examined  Rectal:   Not examined   Extremities:   No clubbing, cyanosis or edema.  Mild bunion deformities bilaterally, nontender.  Pulses:   2+ and symmetric all extremities    Skin:   Skin color, texture, turgor normal, no lesions.  Lymph nodes:  Cervical, supraclavicular nodes normal    Neurologic:   Normal strength, sensation and gait; reflexes 2+ and symmetric throughout                 Psych:   Normal mood, though intermittently appeared sad in discussing family losses. Full range of affect, normal hygiene and grooming    ASSESSMENT/PLAN:  Annual physical exam  Medicare annual wellness visit, subsequent  Essential hypertension Assessment & Plan: Seems to be situational. Borderline now.  Encouraged her to monitor BP and f/u if consistently elevated.  Continue low Na diet, encouraged daily exercise and weight loss  Orders: -     Comprehensive metabolic panel  Impaired fasting glucose Assessment & Plan: Encouraged limiting sugar, carbs, daily exercise and weight loss  Orders: -     Hemoglobin A1c -     Comprehensive metabolic panel  Medication monitoring encounter -     CBC with Differential/Platelet -     Lipid panel -      Comprehensive metabolic panel  Pure hypercholesterolemia Assessment & Plan: Continue rosuvastatin. Due for recheck. Reviewed lowfat, low cholesterol diet  Orders: -     Lipid panel  Herpes labialis Assessment & Plan: Infrequent flares. Refilled Valtrex for prn use  Orders: -     valACYclovir HCl; TAKE 2 TABLETS AT ONSET OF COLD SORE FOLLOWED BY SECOND DOSE 12 HOURS LATER. 4 TABS PER BREAKOUT  Dispense: 28 tablet; Refill: 0  Need for pneumococcal 20-valent conjugate vaccination -     Pneumococcal conjugate vaccine 20-valent  Class 2 severe obesity due to excess calories with serious comorbidity and body mass index (BMI) of 39.0 to 39.9 in adult [facility]) Assessment & Plan: Counseled re: risks, healthy diet, portions, exercise, wt loss   Adjustment disorder, unspecified type   Strongly encouraged counseling--dealing with retiring, multiple losses, sister with cancer. Discussed needing to take care of herself so that she can help care for others.  Discussed the need for structure. Discussed relaxation techniques, and exercises that she can do (mainly seated, since no access to pool or bike) that will not exacerbate her hip or knee pain. Discussed grief counseling (as a separate option from a therapist). She will consider these things (took a lot of convincing to even consider).   Discussed monthly self breast exams and yearly mammograms; at least 30 minutes of aerobic activity at least 5 days/week, weight-bearing exercise 2x/week; proper sunscreen use reviewed; healthy diet, including goals of calcium and vitamin D intake and alcohol recommendations (less than or equal to 1 drink/day) reviewed; regular seatbelt use; changing batteries in smoke detectors. Immunization recommendations discussed--continue yearly high dose flu shots.  Asked her to verify which vaccines she got from her pharmacy--unclear why she would have gotten a 3rd shingrix, when she needed a Tdap. If this is truly what she  got, then  still needs to get Tdap from pharmacy. Bivalent COVID booster recommended, discussed now and/or waiting until new ones comes out in the Fall  RSV vaccine is recommended in the Fall (to confirm with pharmacy that she didn't get last year, and if she did, need to wait to see when recommended to get another one). Colonoscopy recommendations reviewed--UTD.  Pap smears are no longer indicated based on age. Hearing evaluation is recommended.  MOST form reviewed/signed, no changes.  Full Code, Full care.   Medicare Attestation I have personally reviewed: The patient's medical and social history Their use of alcohol, tobacco or illicit drugs Their current medications and supplements The patient's functional ability including ADLs,fall risks, home safety risks, cognitive, and hearing and visual impairment Diet and physical activities Evidence for depression or mood disorders  The patient's weight, height, BMI have been recorded in the chart.  I have made referrals, counseling, and provided education to the patient based on review of the above and I have provided the patient with a written personalized care plan for preventive services.

## 2022-08-16 NOTE — Patient Instructions (Incomplete)
  HEALTH MAINTENANCE RECOMMENDATIONS:  It is recommended that you get at least 30 minutes of aerobic exercise at least 5 days/week (for weight loss, you may need as much as 60-90 minutes). This can be any activity that gets your heart rate up. This can be divided in 10-15 minute intervals if needed, but try and build up your endurance at least once a week.  Weight bearing exercise is also recommended twice weekly.  Eat a healthy diet with lots of vegetables, fruits and fiber.  "Colorful" foods have a lot of vitamins (ie green vegetables, tomatoes, red peppers, etc).  Limit sweet tea, regular sodas and alcoholic beverages, all of which has a lot of calories and sugar.  Up to 1 alcoholic drink daily may be beneficial for women (unless trying to lose weight, watch sugars).  Drink a lot of water.  Calcium recommendations are 1200-1500 mg daily (1500 mg for postmenopausal women or women without ovaries), and vitamin D 1000 IU daily.  This should be obtained from diet and/or supplements (vitamins), and calcium should not be taken all at once, but in divided doses.  Monthly self breast exams and yearly mammograms for women over the age of 62 is recommended.  Sunscreen of at least SPF 30 should be used on all sun-exposed parts of the skin when outside between the hours of 10 am and 4 pm (not just when at beach or pool, but even with exercise, golf, tennis, and yard work!)  Use a sunscreen that says "broad spectrum" so it covers both UVA and UVB rays, and make sure to reapply every 1-2 hours.  Remember to change the batteries in your smoke detectors when changing your clock times in the spring and fall. Carbon monoxide detectors are recommended for your home.  Use your seat belt every time you are in a car, and please drive safely and not be distracted with cell phones and texting while driving.   Lauren [name] , Thank you for taking time to come for your Medicare Wellness Visit. I appreciate your ongoing  commitment to your health goals. Please review the following plan we discussed and let me know if I can assist you in the future.   This is a list of the screening recommended for you and due dates:  Health Maintenance  Topic Date Due   DTaP/Tdap/Td vaccine (3 - Td or Tdap) 10/06/2018   COVID-19 Vaccine (3 - Moderna risk series) 11/12/2019   Flu Shot  09/28/2022   Colon Cancer Screening  07/29/2023   Medicare Annual Wellness Visit  08/17/2023   Mammogram  12/03/2023   Pneumonia Vaccine  Completed   DEXA scan (bone density measurement)  Completed   Hepatitis C Screening  Completed   Zoster (Shingles) Vaccine  Completed   HPV Vaccine  Aged Out   COVID booster is recommended.  Continue yearly high dose flu shots. RSV vaccine is recommended in the Fall. You need to get this from the pharmacy. You can get it the same day as your flu shot, or separate them by 2 weeks.

## 2022-08-17 ENCOUNTER — Ambulatory Visit (INDEPENDENT_AMBULATORY_CARE_PROVIDER_SITE_OTHER): Payer: Medicare Other | Admitting: Family Medicine

## 2022-08-17 ENCOUNTER — Encounter: Payer: Self-pay | Admitting: Family Medicine

## 2022-08-17 VITALS — BP 138/70 | HR 68 | Temp 98.0°F | Resp 16 | Ht 58.75 in | Wt 193.8 lb

## 2022-08-17 DIAGNOSIS — R7301 Impaired fasting glucose: Secondary | ICD-10-CM

## 2022-08-17 DIAGNOSIS — I10 Essential (primary) hypertension: Secondary | ICD-10-CM | POA: Diagnosis not present

## 2022-08-17 DIAGNOSIS — Z23 Encounter for immunization: Secondary | ICD-10-CM

## 2022-08-17 DIAGNOSIS — E7800 Pure hypercholesterolemia, unspecified: Secondary | ICD-10-CM

## 2022-08-17 DIAGNOSIS — F4320 Adjustment disorder, unspecified: Secondary | ICD-10-CM

## 2022-08-17 DIAGNOSIS — Z0000 Encounter for general adult medical examination without abnormal findings: Secondary | ICD-10-CM

## 2022-08-17 DIAGNOSIS — Z5181 Encounter for therapeutic drug level monitoring: Secondary | ICD-10-CM

## 2022-08-17 DIAGNOSIS — Z6839 Body mass index (BMI) 39.0-39.9, adult: Secondary | ICD-10-CM | POA: Diagnosis not present

## 2022-08-17 DIAGNOSIS — B001 Herpesviral vesicular dermatitis: Secondary | ICD-10-CM

## 2022-08-17 MED ORDER — VALACYCLOVIR HCL 1 G PO TABS: ORAL_TABLET | ORAL | 0 refills | Status: DC

## 2022-08-18 ENCOUNTER — Telehealth: Payer: Self-pay | Admitting: Family Medicine

## 2022-08-18 DIAGNOSIS — I10 Essential (primary) hypertension: Secondary | ICD-10-CM | POA: Insufficient documentation

## 2022-08-18 LAB — CBC WITH DIFFERENTIAL/PLATELET
Basophils Absolute: 0.1 10*3/uL (ref 0.0–0.2)
Basos: 1 %
EOS (ABSOLUTE): 0.1 10*3/uL (ref 0.0–0.4)
Eos: 2 %
Hematocrit: 42.7 % (ref 34.0–46.6)
Hemoglobin: 14.2 g/dL (ref 11.1–15.9)
Immature Grans (Abs): 0 10*3/uL (ref 0.0–0.1)
Immature Granulocytes: 0 %
Lymphocytes Absolute: 1.6 10*3/uL (ref 0.7–3.1)
Lymphs: 28 %
MCH: 30 pg (ref 26.6–33.0)
MCHC: 33.3 g/dL (ref 31.5–35.7)
MCV: 90 fL (ref 79–97)
Monocytes Absolute: 0.4 10*3/uL (ref 0.1–0.9)
Monocytes: 7 %
Neutrophils Absolute: 3.8 10*3/uL (ref 1.4–7.0)
Neutrophils: 62 %
Platelets: 367 10*3/uL (ref 150–450)
RBC: 4.73 x10E6/uL (ref 3.77–5.28)
RDW: 12.9 % (ref 11.7–15.4)
WBC: 6 10*3/uL (ref 3.4–10.8)

## 2022-08-18 LAB — COMPREHENSIVE METABOLIC PANEL
ALT: 16 IU/L (ref 0–32)
AST: 11 IU/L (ref 0–40)
Albumin: 4.4 g/dL (ref 3.8–4.8)
Alkaline Phosphatase: 74 IU/L (ref 44–121)
BUN/Creatinine Ratio: 14 (ref 12–28)
BUN: 10 mg/dL (ref 8–27)
Bilirubin Total: 0.3 mg/dL (ref 0.0–1.2)
CO2: 25 mmol/L (ref 20–29)
Calcium: 9.5 mg/dL (ref 8.7–10.3)
Chloride: 103 mmol/L (ref 96–106)
Creatinine, Ser: 0.73 mg/dL (ref 0.57–1.00)
Globulin, Total: 2.8 g/dL (ref 1.5–4.5)
Glucose: 97 mg/dL (ref 70–99)
Potassium: 5.3 mmol/L — ABNORMAL HIGH (ref 3.5–5.2)
Sodium: 141 mmol/L (ref 134–144)
Total Protein: 7.2 g/dL (ref 6.0–8.5)
eGFR: 87 mL/min/{1.73_m2} (ref >59)

## 2022-08-18 LAB — LIPID PANEL
Chol/HDL Ratio: 2.8 ratio (ref 0.0–4.4)
Cholesterol, Total: 209 mg/dL — ABNORMAL HIGH (ref 100–199)
HDL: 74 mg/dL (ref >39)
LDL Chol Calc (NIH): 117 mg/dL — ABNORMAL HIGH (ref 0–99)
Triglycerides: 102 mg/dL (ref 0–149)
VLDL Cholesterol Cal: 18 mg/dL (ref 5–40)

## 2022-08-18 LAB — HEMOGLOBIN A1C
Est. average glucose Bld gHb Est-mCnc: 120 mg/dL
Hgb A1c MFr Bld: 5.8 % — ABNORMAL HIGH (ref 4.8–5.6)

## 2022-08-18 NOTE — Assessment & Plan Note (Signed)
Encouraged limiting sugar, carbs, daily exercise and weight loss

## 2022-08-18 NOTE — Assessment & Plan Note (Signed)
Infrequent flares. Refilled Valtrex for prn use

## 2022-08-18 NOTE — Assessment & Plan Note (Signed)
Counseled re: risks, healthy diet, portions, exercise, wt loss

## 2022-08-18 NOTE — Assessment & Plan Note (Signed)
Seems to be situational. Borderline now.  Encouraged her to monitor BP and f/u if consistently elevated.  Continue low Na diet, encouraged daily exercise and weight loss

## 2022-08-18 NOTE — Telephone Encounter (Signed)
Lauren [name] called and asks if her lab results from yesterday's visit be mailed to her to the address on file.

## 2022-08-18 NOTE — Assessment & Plan Note (Signed)
Continue rosuvastatin. Due for recheck. Reviewed lowfat, low cholesterol diet

## 2022-08-29 ENCOUNTER — Other Ambulatory Visit: Payer: Self-pay | Admitting: Family Medicine

## 2022-08-29 DIAGNOSIS — Z8249 Family history of ischemic heart disease and other diseases of the circulatory system: Secondary | ICD-10-CM

## 2022-08-29 DIAGNOSIS — E7800 Pure hypercholesterolemia, unspecified: Secondary | ICD-10-CM

## 2022-09-08 ENCOUNTER — Ambulatory Visit: Payer: Medicare Other | Admitting: Nurse Practitioner

## 2022-09-11 ENCOUNTER — Other Ambulatory Visit: Payer: Self-pay | Admitting: Family Medicine

## 2022-09-11 DIAGNOSIS — B001 Herpesviral vesicular dermatitis: Secondary | ICD-10-CM

## 2022-11-26 ENCOUNTER — Other Ambulatory Visit: Payer: Self-pay | Admitting: Family Medicine

## 2022-11-26 DIAGNOSIS — E7800 Pure hypercholesterolemia, unspecified: Secondary | ICD-10-CM

## 2022-11-26 DIAGNOSIS — Z8249 Family history of ischemic heart disease and other diseases of the circulatory system: Secondary | ICD-10-CM

## 2022-12-06 ENCOUNTER — Encounter: Payer: Self-pay | Admitting: Family Medicine

## 2022-12-06 ENCOUNTER — Ambulatory Visit (INDEPENDENT_AMBULATORY_CARE_PROVIDER_SITE_OTHER): Payer: Medicare Other | Admitting: Family Medicine

## 2022-12-06 VITALS — BP 120/70 | HR 76 | Ht 58.75 in | Wt 197.6 lb

## 2022-12-06 DIAGNOSIS — I878 Other specified disorders of veins: Secondary | ICD-10-CM | POA: Diagnosis not present

## 2022-12-06 DIAGNOSIS — Z23 Encounter for immunization: Secondary | ICD-10-CM

## 2022-12-06 NOTE — Progress Notes (Signed)
Chief Complaint  Patient presents with   Joint Swelling    B/L ankle swelling and bruising in ankles. Right worse than left. She had shots for skin cancer treatment in the right lower ankle area and this has been happening ever since. Feels like she has been kicked in the shin, there is some bruising and skin changes (ridges). The shots were given about 6 months ago by Dr.Jewell at [facility].    She has noticed the swelling and "bruising" in her ankles for the last 3-4 months . Wonders if it could be related to the injections she had in her legs for skin cancers by the dermatologist.  Has had many skin cancers treated on both LE's, but never injected.  Swelling is improved in the morning, worsens as the days go on. She is sore at the shins bilaterally, R>L. She reports some "bruising", but no injury.  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  Over the weekend she developed burning chest and cough after eating very small piece of pizza from [employer].  She took Pepto bismol and Tums, eventually vomited, and she felt better afterwards. No prior issues with any heartburn or reflux. No recurrent symptoms.   PMH, PSH, SH reviewed  Outpatient Encounter Medications as of 12/06/2022  Medication Sig Note   hydrochlorothiazide (MICROZIDE) 12.5 MG capsule TAKE 1 CAPSULE BY MOUTH EVERY DAY    Omega-3 Fatty Acids (FISH OIL) 1000 MG CAPS Take 1 capsule by mouth daily.    rosuvastatin (CRESTOR) 10 MG tablet TAKE 1 TABLET BY MOUTH EVERY DAY    acetaminophen (TYLENOL) 500 MG tablet Take 500 mg by mouth every 6 (six) hours as needed. (Patient not taking: Reported on 12/06/2022) 12/06/2022: As needed   cetirizine (ZYRTEC) 10 MG tablet Take 10 mg by mouth daily as needed for allergies. (Patient not taking: Reported on 12/06/2022) 12/06/2022: As needed   cyanocobalamin 1000 MCG tablet Take 1,000 mcg by mouth daily. (Patient not taking:  Reported on 12/06/2022)    Garlic 500 MG CAPS Take 1 capsule by mouth daily. (Patient not taking: Reported on 11/09/2021)    ibuprofen (ADVIL) 200 MG tablet Take 400 mg by mouth every 6 (six) hours as needed. (Patient not taking: Reported on 12/06/2022) 12/06/2022: As neede   valACYclovir (VALTREX) 1000 MG tablet TAKE 2 TABLETS AT ONSET OF COLD SORE FOLLOWED BY SECOND DOSE 12 HOURS LATER. 4 TABS PER BREAKOUT (Patient not taking: Reported on 12/06/2022) 12/06/2022: As needed   [DISCONTINUED] meloxicam (MOBIC) 15 MG tablet Take 1 tablet (15 mg total) by mouth daily. (Patient not taking: Reported on 10/05/2021)    [DISCONTINUED] methocarbamol (ROBAXIN) 500 MG tablet Take 1 tablet (500 mg total) by mouth 2 (two) times daily. (Patient not taking: Reported on 08/15/2021)    No facility-administered encounter medications on file as of 12/06/2022.   Allergies  Allergen Reactions   Morphine And Codeine     Patient prefers not to received"became addictive to this many years ago"- Demerol is okay     ROS: No f/c, URI symptoms. Recent vomiting per HPI. No current nausea, heartburn, abdominal pain. Swelling and discoloration of legs per per HPI. No chest pain or shortness of breath. No HA or dizziness. Moods are good.   PHYSICAL EXAM:  BP 120/70   Pulse 76   Ht 4' 10.75" (1.492 m)   Wt 197 lb 9.6 oz (89.6 kg)   BMI 40.25  kg/m   Wt Readings from Last 3 Encounters:  12/06/22 197 lb 9.6 oz (89.6 kg)  08/17/22 193 lb 12.8 oz (87.9 kg)  11/28/21 187 lb (84.8 kg)   Pleasant, well-appearing female in no distress HEENT: conjunctiva and sclera are clear, EOMI Extremities:   Some scarring noted bilaterally from prior cancer treatments. There is some thickening of the skin at the R>L anterior shin. Some discoloration (hyperpigmentation) noted.  Skin is intact, no weeping or crusting. There is bluish discoloration noted at R posterior ankle, which blanches, more c/w superficial veins, not a bruise. There are  many other superficial veins noted. Large varicosity noted at the posterior mid-calf, nontender. Negative Homan's sign.    ASSESSMENT/PLAN:  Venous stasis of both lower extremities - R>L. No significant dermatitis, but some skin changes noted.  Discussed compression stockings, leg elevation  Need for influenza vaccination - Plan: Flu Vaccine Trivalent High Dose (Fluad)  Your swelling and discoloration are likely due to  venous stasis (blood staying pooled in the veins). Try and avoid prolonged sitting and standing without wearing compression stockings. Keep legs elevated above the level of your heart when you can (ie when seated for prolonged periods like reading or watching TV). Get up periodically and walk around, do some calf raises.

## 2022-12-06 NOTE — Patient Instructions (Signed)
  Your swelling and discoloration are likely due to  venous stasis (blood staying pooled in the veins). Try and avoid prolonged sitting and standing without wearing compression stockings. Keep legs elevated above the level of your heart when you can (ie when seated for prolonged periods like reading or watching TV). Get up periodically and walk around, do some calf raises.

## 2022-12-07 ENCOUNTER — Ambulatory Visit: Payer: Medicare Other | Attending: Internal Medicine | Admitting: Internal Medicine

## 2022-12-07 ENCOUNTER — Encounter: Payer: Self-pay | Admitting: Internal Medicine

## 2022-12-07 VITALS — BP 160/80 | HR 89 | Ht <= 58 in | Wt 196.6 lb

## 2022-12-07 DIAGNOSIS — Z09 Encounter for follow-up examination after completed treatment for conditions other than malignant neoplasm: Secondary | ICD-10-CM | POA: Diagnosis not present

## 2022-12-07 DIAGNOSIS — R0602 Shortness of breath: Secondary | ICD-10-CM | POA: Diagnosis not present

## 2022-12-07 NOTE — Progress Notes (Signed)
Cardiology Office Note:    Date:  12/07/2022   ID:  Lauren, [name] August 21, 1950, MRN [id number]  PCP:  Joselyn Arrow, MD  Cardiologist:  previously seen by Eloy End Electrophysiologist:  None    History of Present Illness:    Lauren [name] is a 72 y.o. female with a FHx of premature CAD (mother MI 39; 10 sibs, none living beyond 90, several died of MIs)  Myoview in 12/17/14 was normal  Hx of HL, DJD      The pt was last seen in cardiology in December 17, 2019    Since seen she has had only one episode of chest discomfort   Ate at [facility] [employer] this weekend  After one bite of pizza developed chest pain .   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   COVID-19 05/2019   Facial basal cell cancer    nose (Dr. Nicholas Lose), leg   Herpes simplex labialis    Hyperlipidemia    Melanoma (HCC)    right shoulder- 10 yrs ago- no further issues   SCC (squamous cell carcinoma) 11/16/2016   left leg, 12/2018 RLE   Squamous cell skin cancer    many sites (hands/legs)    Past Surgical History:  Procedure Laterality Date   APPENDECTOMY     CARDIOVASCULAR STRESS TEST  06/2005   Dr. Clarene Duke   CESAREAN SECTION  x2   Chalazionectomy Right 02/2022   RLL, Dr. Nile Riggs   MOHS SURGERY Left 07/10/2017   LLE for SCC, Dr. Irene Limbo   REFRACTIVE SURGERY  age 15   REFRACTIVE SURGERY     TOTAL HIP ARTHROPLASTY Right 01/19/2015   Procedure: TOTAL RIGHT HIP ARTHROPLASTY ANTERIOR APPROACH;  Surgeon: Durene Romans, MD;  Location: [facility];  Service: Orthopedics;  Laterality: Right;    Current Medications: Current Meds  Medication Sig   rosuvastatin (CRESTOR) 10 MG tablet TAKE 1 TABLET BY MOUTH EVERY DAY      Allergies:   Morphine and codeine   Social History   Socioeconomic History   Marital status: Divorced    Spouse name: Not on file   Number of children: 2   Years of education: Not on file   Highest education level: Not on file  Occupational History   Occupation: used Quarry manager Building surveyor)    Employer: [employer]  Tobacco Use   Smoking status: Never   Smokeless tobacco: Never  Vaping Use   Vaping status: Never Used  Substance and Sexual Activity   Alcohol use: Yes    Comment: margarita 2/month or less   Drug use: No   Sexual activity: Not Currently    Partners: Male  Other Topics Concern   Not on file  Social History Narrative   Lives alone. Both sons live in [place]. 2 grandchildren, and 3 great-grandchildren who "practically live with her" (live down the street).  She is very active with them (taking them places, caring for them).   She retired 08/26/21.   Works part-time as a Engineer, structural (3 days/week)  Updated 07/2022   Social Determinants of Health   Financial Resource Strain: Not on file  Food Insecurity: Not on file  Transportation Needs: Not on file  Physical Activity: Not on file  Stress: Not on file  Social Connections: Not on file     Family History: The patient's family history includes Alcohol abuse in her brother and father; Arthritis in her sister; Breast cancer (age of onset: 72) in her sister; COPD in her brother, brother, mother, and sister; Diabetes in her brother, sister, sister, and sister; Gout in her sister; Luiz Blare' disease in her sister; Heart disease in her brother, brother, sister, sister, sister, and sister; Heart disease (age of onset: 30) in her son; Heart disease (age of onset: 85) in her mother; Hyperlipidemia in her mother, sister, and sister; Hypertension in her mother, sister, and sister; Lung cancer in her sister; Lupus in her sister; Stroke in her son; Stroke (age of onset: 57) in her father.  ROS:   Please see the history of  present illness.    All other systems reviewed and are negative.  EKGs/Labs/Other Studies Reviewed:    The following studies were reviewed today:  EKG:  EKG is ordered today.  SR 89 bpm    Recent Labs: 08/17/2022: ALT 16; BUN 10; Creatinine, Ser 0.73; Hemoglobin 14.2; Platelets 367; Potassium 5.3; Sodium 141  Recent Lipid Panel    Component Value Date/Time   CHOL 209 (H) 08/17/2022 1233   TRIG 102 08/17/2022 1233   HDL 74 08/17/2022 1233   CHOLHDL 2.8 08/17/2022 1233   CHOLHDL 2.1 01/11/2017 1003   VLDL 31 (H) 01/06/2016 0855   LDLCALC 117 (H) 08/17/2022 1233   LDLCALC 100 (H) 01/11/2017 1003    Physical Exam:    VS:  BP (!) 160/80   Pulse 89   Ht 4\' 10"  (1.473 m)   Wt 196 lb 9.6 oz (89.2 kg)   SpO2 95%   BMI 41.09 kg/m     Wt Readings from Last 3 Encounters:  12/07/22 196 lb 9.6 oz (89.2 kg)  12/06/22 197 lb 9.6 oz (89.6 kg)  08/17/22 193 lb 12.8 oz (87.9 kg)     GEN:  Well nourished, well developed in no acute distress HEENT: Normal NECK: No JVD; No carotid bruit CARDIAC: RRR, no murmurs RESPIRATORY:  Clear to auscultation ABDOMEN: Soft, non-tender, non-distended    PLAN:     1   Cardiac risk assessment   Recent episode of CP atypical for angina   She gets occasional SOB   Will get nonconstrast CT of chest to evaluate   2  HL   On Crestor   LDL 117   Will review test results to define goals     3  HTN  BP is high today   She says anxious being in office with new MD  Also has been racing around town   STressed    Follow at home    Goal 110s to 120s/   If higher should correspond on MyChart      Medication Adjustments/Labs and Tests Ordered: Current medicines are reviewed at length with the patient today.  Concerns regarding medicines are outlined above.  Orders Placed This Encounter  Procedures   EKG 12-Lead   No orders of the defined types were placed in this encounter.   Signed, Dietrich Pates, MD  12/07/2022 2:00 PM    [facility]

## 2022-12-07 NOTE — Patient Instructions (Signed)
Medication Instructions:   *If you need a refill on your cardiac medications before your next appointment, please call your pharmacy*   Lab Work:  If you have labs (blood work) drawn today and your tests are completely normal, you will receive your results only by: MyChart Message (if you have MyChart) OR A paper copy in the mail If you have any lab test that is abnormal or we need to change your treatment, we will call you to review the results.   Testing/Procedures: NON-CONTRAST CT CHEST   Follow-Up: At [facility], you and your health needs are our priority.  As part of our continuing mission to provide you with exceptional heart care, we have created designated Provider Care Teams.  These Care Teams include your primary Cardiologist (physician) and Advanced Practice Providers (APPs -  Physician Assistants and Nurse Practitioners) who all work together to provide you with the care you need, when you need it.  We recommend signing up for the patient portal called "MyChart".  Sign up information is provided on this After Visit Summary.  MyChart is used to connect with patients for Virtual Visits (Telemedicine).  Patients are able to view lab/test results, encounter notes, upcoming appointments, etc.  Non-urgent messages can be sent to your provider as well.   To learn more about what you can do with MyChart, go to [email or web address].    Your next appointment:  MAY WITH DR Tenny Craw

## 2022-12-15 DIAGNOSIS — M1712 Unilateral primary osteoarthritis, left knee: Secondary | ICD-10-CM | POA: Diagnosis not present

## 2022-12-15 DIAGNOSIS — M7061 Trochanteric bursitis, right hip: Secondary | ICD-10-CM | POA: Diagnosis not present

## 2022-12-22 ENCOUNTER — Encounter: Payer: Self-pay | Admitting: Medical

## 2022-12-22 ENCOUNTER — Ambulatory Visit
Admission: RE | Admit: 2022-12-22 | Discharge: 2022-12-22 | Disposition: A | Discharge status: Home / Self Care | Payer: Medicare Other | Source: Ambulatory Visit | Attending: Internal Medicine | Admitting: Internal Medicine

## 2022-12-22 ENCOUNTER — Ambulatory Visit (INDEPENDENT_AMBULATORY_CARE_PROVIDER_SITE_OTHER): Payer: Medicare Other | Admitting: Medical

## 2022-12-22 ENCOUNTER — Ambulatory Visit (HOSPITAL_COMMUNITY)
Admission: RE | Admit: 2022-12-22 | Discharge: 2022-12-22 | Disposition: A | Discharge status: Home / Self Care | Payer: Medicare Other | Source: Ambulatory Visit | Attending: Medical | Admitting: Medical

## 2022-12-22 VITALS — BP 154/70 | HR 82 | Temp 97.9°F | Wt 194.6 lb

## 2022-12-22 DIAGNOSIS — R0602 Shortness of breath: Secondary | ICD-10-CM | POA: Diagnosis not present

## 2022-12-22 DIAGNOSIS — G459 Transient cerebral ischemic attack, unspecified: Secondary | ICD-10-CM

## 2022-12-22 DIAGNOSIS — R7301 Impaired fasting glucose: Secondary | ICD-10-CM | POA: Diagnosis not present

## 2022-12-22 DIAGNOSIS — E7800 Pure hypercholesterolemia, unspecified: Secondary | ICD-10-CM | POA: Diagnosis not present

## 2022-12-22 DIAGNOSIS — R200 Anesthesia of skin: Secondary | ICD-10-CM

## 2022-12-22 DIAGNOSIS — I10 Essential (primary) hypertension: Secondary | ICD-10-CM

## 2022-12-22 DIAGNOSIS — R079 Chest pain, unspecified: Secondary | ICD-10-CM | POA: Diagnosis not present

## 2022-12-22 MED ORDER — ROSUVASTATIN CALCIUM 20 MG PO TABS
20.0000 mg | ORAL_TABLET | Freq: Every day | ORAL | 3 refills | Status: AC
Start: 2022-12-22 — End: ?

## 2022-12-22 MED ORDER — ASPIRIN 81 MG PO TBEC: 81.0000 mg | DELAYED_RELEASE_TABLET | Freq: Every day | ORAL | 3 refills | Status: AC

## 2022-12-22 MED ORDER — METOPROLOL SUCCINATE ER 25 MG PO TB24
25.0000 mg | ORAL_TABLET | Freq: Every day | ORAL | 2 refills | Status: DC
Start: 2022-12-22 — End: 2023-03-19

## 2022-12-22 NOTE — Progress Notes (Signed)
Subjective:  Lauren [name] is a 72 y.o. female who presents for Chief Complaint  Patient presents with   Numbness    7:30 last night for 10-15 min. Started with right side of face and right tongue. Lightheaded. BP taken after relaxing 30-40 min and 103 heart rate.    Here for concerns.  Last night about 7:30 PM at home she started getting numbness of her right lower face and tongue.  This lasted 10 to 15 minutes.  She also felt lightheaded at that time.  She checked her blood pressure and pulse and her blood pressure was elevated and her pulse was around 100.  The pulse stayed elevated for about 45 minutes.  Her pulse normally is in the mid 70s  The symptoms did resolve  She has had no additional symptoms since then  She wanted to come in today to get checked out.  Interestingly she had a CT coronary heart screening today.  She has not gotten results back on this yet  She saw cardiology recently due to strong family history of heart disease, history of hypertension and hyperlipidemia.  Her blood pressures have been elevated in the last month.  She has hydrochlorothiazide 12.5 mg daily but does not tolerate it that well.  She is really strong cramps in her legs when she takes this medication  She lives alone  She denies any problems with her memory.  Otherwise has been in normal state of health  No other aggravating or relieving factors.    No other c/o.  Past Medical History:  Diagnosis Date   Arthritis    hips-Right worse and right knee.   COVID-19 05/2019   Facial basal cell cancer    nose (Dr. Nicholas Lose), leg   Herpes simplex labialis    Hyperlipidemia    Melanoma (HCC)    right shoulder- 10 yrs ago- no further issues   SCC (squamous cell carcinoma) 11/16/2016   left leg, 12/2018 RLE   Squamous cell skin cancer    many sites (hands/legs)   Past Surgical History:  Procedure Laterality Date   APPENDECTOMY     CARDIOVASCULAR STRESS TEST  06/2005   Dr. Clarene Duke   CESAREAN  SECTION  x2   Chalazionectomy Right 02/2022   RLL, Dr. Nile Riggs   MOHS SURGERY Left 07/10/2017   LLE for SCC, Dr. Irene Limbo   REFRACTIVE SURGERY  age 32   REFRACTIVE SURGERY     TOTAL HIP ARTHROPLASTY Right 01/19/2015   Procedure: TOTAL RIGHT HIP ARTHROPLASTY ANTERIOR APPROACH;  Surgeon: Durene Romans, MD;  Location: [facility];  Service: Orthopedics;  Laterality: Right;    The following portions of the patient's history were reviewed and updated as appropriate: allergies, current medications, past family history, past medical history, past social history, past surgical history and problem list.  ROS Otherwise as in subjective above    Objective: BP (!) 154/70   Pulse 82   Temp 97.9 F (36.6 C) (Oral)   Wt 194 lb 9.6 oz (88.3 kg)   SpO2 98%   BMI 40.67 kg/m   Wt Readings from Last 3 Encounters:  12/22/22 194 lb 9.6 oz (88.3 kg)  12/07/22 196 lb 9.6 oz (89.2 kg)  12/06/22 197 lb 9.6 oz (89.6 kg)   BP Readings from Last 3 Encounters:  12/22/22 (!) 154/70  12/07/22 (!) 160/80  12/06/22 120/70   General appearance: alert, no distress, well developed, well nourished HEENT: normocephalic, sclerae anicteric, arcus senilis noted, conjunctiva pink and moist,  TMs pearly, nares patent, no discharge or erythema, pharynx normal Oral cavity: MMM, no lesions Neck: supple, no lymphadenopathy, no thyromegaly, no masses, no bruits Heart: RRR, normal S1, S2, no murmurs Lungs: CTA bilaterally, no wheezes, rhonchi, or rales Pulses: 2+ radial pulses, 2+ pedal pulses, normal cap refill Ext: no edema Neuro:  CN II through XII intact, nonfocal exam otherwise Psych: Pleasant, answers questions appropriate    Assessment: Encounter Diagnoses  Name Primary?   TIA (transient ischemic attack) Yes   Facial numbness    Essential hypertension    Impaired fasting glucose    Pure hypercholesterolemia      Plan: We discussed her symptoms and concerns and I suspect she had a TIA last night.  We  discussed the symptoms, concerns, possible risk for future stroke  Advised that at any point going forward if she gets the symptoms again or other new stroke sypmtoms to call 911  We will try to get her a stat CT head today, also set up for Carotid US and correspond with Dr. Tenny Craw about doing Echo.  She is already on Crestor 10mg  but advised she double up and take 2 daily or 20mg  daily  Discussed her intolerance to hydrochlorothiazide.  Begin Toprol XL 25mg  daily.  Discussed risks/benefits.  Add back Aspirin 81mg  daily. She has been on this in the past.  Gave one dose today in office.   Of note she had a CT coronary test today per cardiology, pending results.     [name] was seen today for numbness.  Diagnoses and all orders for this visit:  TIA (transient ischemic attack) -     CT HEAD WO CONTRAST ( ); Future -     US Carotid Bilateral; Future  Facial numbness -     CT HEAD WO CONTRAST ( ); Future -     US Carotid Bilateral; Future  Essential hypertension -     US Carotid Bilateral; Future  Impaired fasting glucose -     US Carotid Bilateral; Future  Pure hypercholesterolemia -     US Carotid Bilateral; Future  Other orders -     aspirin EC 81 MG tablet; Take 1 tablet (81 mg total) by mouth daily. -     rosuvastatin (CRESTOR) 20 MG tablet; Take 1 tablet (20 mg total) by mouth daily. -     metoprolol succinate (TOPROL XL) 25 MG 24 hr tablet; Take 1 tablet (25 mg total) by mouth daily.    Follow up: pending scan

## 2022-12-22 NOTE — Patient Instructions (Addendum)
If any new stroke symptoms (numbness, weakness, confusion, slurred speech, sudden change in tingling, weakness, numbness, for example), then call 911/go to emergency dept.   Recommendations: Begin Aspirin 81mg  daily.  You had your first dose today at our office Increase Crestor to 20mg  daily.  I sent a new dose, but use 2 of the Crestor 10mg  tablets you have to equal 20mg  daily until you run out.  Then start the new 20mg  dose once daily Begin Toprol XL 25mg  daily blood pressure tablet We will work on scheduling you for ultrasound of carotids I will send a message to your heart doctor about getting the echocardiogram/ultrasound of your heart   Go for CT head today at [facility] at 5:20pm.  Report to the Emergency department but tell them you are there for CT head scan only scheduled for 5:45pm.     Ischemic Stroke  An ischemic stroke happens when part of the brain does not get enough blood. This can cause a lifelong change in how the brain works. An ischemic stroke is an emergency. It must be treated right away. What are the causes? This condition is caused by lower blood flow to part of the brain. This may be due to: A small clump, or clot, of blood. A buildup of fatty substance (plaque) in the blood vessels. An abnormal heart rhythm. A blocked or damaged artery in the head or neck. Infection. Swelling of the arteries in the brain. Sometimes, the cause is not known. What increases the risk? Certain medical conditions, such as: High blood pressure (hypertension). Heart disease. Diabetes. High cholesterol. Being very overweight. Sleep problems (sleep apnea). Other risk factors that you can change include: Smoking. Not being active. Heavy alcohol and drug use. Taking birth control pills, especially if you smoke. Risk factors that you cannot change include: Being older than age 60. Having had blood clots, stroke, or mini-stroke (transient ischemic attack, TIA)  before. Having a family history of stroke. What are the signs or symptoms? Symptoms of a stroke usually happen all of a sudden. They can include: Weakness or a loss of feeling in your face, arm, or leg, often on one side of the body. Loss of balance or coordination. Slurred speech. Trouble talking or understanding what people say. Seeing things blurry or seeing double of one thing. Feeling dizzy or confused. Feeling like you may vomit (nausea) or vomiting. A very bad headache. If you can, write down the exact time you first had symptoms. Tell your doctor. If symptoms come and go, they could be caused by a mini-stroke. Get help right away, even if you feel better. How is this treated? You must get treatment as soon as you have stroke symptoms. Some treatments work better if they are done within 3-6 hours of your first symptoms. You may get medicines that do these things: Take out or break up the blood clot. Control blood pressure. Thin your blood. Other treatments may include: Getting oxygen. Getting fluids through an IV tube. Procedures that make blood flow better. After a stroke, you may work with therapists to help you get better. Follow these instructions at home: Medicines Take over-the-counter and prescription medicines only as told by your doctor. If you were told to take a medicine to thin your blood, take it exactly as told. Take it at the same time each day. Taking too much of this medicine can cause bleeding. If you do not take enough medicine, it may not work as well. If your  doctor did not prescribe medicines that have aspirin or NSAIDs, such as ibuprofen, talk to your doctor before you take any of these. If you are taking a blood thinner: Put pressure over any cuts that bleed for longer than normal. Tell your dentist and other doctors that you take this medicine. Avoid activities that could hurt or bruise you. Wear a medical alert bracelet or carry a card that shows you  are taking blood thinners. Eating and drinking Follow instructions from your doctor about diet. Eat healthy foods. If you have trouble swallowing: Take small bites when eating. Eat foods that are soft or pureed. Safety Follow instructions from your care team about physical activity. Use a walker or cane as told by your doctor. Keep your home safe so you do not fall. Take these steps: Put grab bars in the bedroom and bathroom. Use raised toilets and put a seat in the shower. Get rid of clutter and things you could trip on, such as cords or rugs. General instructions Do not smoke or use any products that contain nicotine or tobacco. If you need help quitting, ask your doctor. If you drink alcohol: Limit how much you have to: 0-1 drink a day for women who are not pregnant. 0-2 drinks a day for men. Know how much alcohol is in your drink. In the [place] one drink equals one 12 oz bottle of beer (355 mL), one 5 oz glass of wine (148 mL), or one 1 oz glass of hard liquor (44 mL). Stay active. Keep all follow-up visits. How is this prevented? You can lower your risk of another stroke by managing these conditions: High blood pressure. High cholesterol. Diabetes. Heart disease. Sleep problems. Being overweight. You can also lower your risk if you: Quit smoking. Limit alcohol. Stay active. Your doctor will continue to help you with ways to prevent problems caused by a stroke. Get help right away if: You have any signs of a stroke. "BE FAST" is an easy way to remember the main warning signs: B - Balance. Dizziness, sudden trouble walking, or loss of balance. E - Eyes. Trouble seeing or a change in how you see. F - Face. Sudden weakness or loss of feeling of the face. The face or eyelid may droop on one side. A - Arms. Weakness or loss of feeling in an arm. This happens all of a sudden and most often on one side of the body. S - Speech. Sudden trouble speaking, slurred speech, or trouble  understanding what people say. T - Time. Time to call emergency services. Write down what time symptoms started. You have other signs of a stroke, such as: A sudden, very bad headache with no known cause. Feeling like you may vomit. Vomiting. A seizure. These symptoms may be an emergency. Get help right away. Call your local emergency services (911 in the [place] Do not wait to see if the symptoms will go away. Do not drive yourself to the hospital. Summary An ischemic stroke happens when the brain does not get enough blood. Symptoms of a stroke often happen all of a sudden. You must get treatment as soon as you have stroke symptoms. Stroke is an emergency. It must be treated right away. This information is not intended to replace advice given to you by your health care provider. Make sure you discuss any questions you have with your health care provider. Document Revised: 09/24/2019 Document Reviewed: 09/24/2019 Elsevier Patient Education  2022 [employer].  Stroke Prevention Some medical conditions and behaviors can lead to a higher chance of having a stroke. You can help prevent a stroke by eating healthy, exercising, not smoking, and managing any medical conditions you have. Stroke is a leading cause of functional impairment. Primary prevention is particularly important because a majority of strokes are first-time events. Stroke changes the lives of not only those who experience a stroke but also their family and other caregivers. How can this condition affect me? A stroke is a medical emergency and should be treated right away. A stroke can lead to brain damage and can sometimes be life-threatening. If a person gets medical treatment right away, there is a better chance of surviving and recovering from a stroke. What can increase my risk? The following medical conditions may increase your risk of a stroke: Cardiovascular disease. High blood pressure  (hypertension). Diabetes. High cholesterol. Sickle cell disease. Blood clotting disorders (hypercoagulable state). Obesity. Sleep disorders (obstructive sleep apnea). Other risk factors include: Being older than age 54. Having a history of blood clots, stroke, or mini-stroke (transient ischemic attack, TIA). Genetic factors, such as race, ethnicity, or a family history of stroke. Smoking cigarettes or using other tobacco products. Taking birth control pills, especially if you also use tobacco. Heavy use of alcohol or drugs, especially cocaine and methamphetamine. Physical inactivity. What actions can I take to prevent this? Manage your health conditions High cholesterol levels. Eating a healthy diet is important for preventing high cholesterol. If cholesterol cannot be managed through diet alone, you may need to take medicines. Take any prescribed medicines to control your cholesterol as told by your health care provider. Hypertension. To reduce your risk of stroke, try to keep your blood pressure below 130/80. Eating a healthy diet and exercising regularly are important for controlling blood pressure. If these steps are not enough to manage your blood pressure, you may need to take medicines. Take any prescribed medicines to control hypertension as told by your health care provider. Ask your health care provider if you should monitor your blood pressure at home. Have your blood pressure checked every year, even if your blood pressure is normal. Blood pressure increases with age and some medical conditions. Diabetes. Eating a healthy diet and exercising regularly are important parts of managing your blood sugar (glucose). If your blood sugar cannot be managed through diet and exercise, you may need to take medicines. Take any prescribed medicines to control your diabetes as told by your health care provider. Get evaluated for obstructive sleep apnea. Talk to your health care provider  about getting a sleep evaluation if you snore a lot or have excessive sleepiness. Make sure that any other medical conditions you have, such as atrial fibrillation or atherosclerosis, are managed. Nutrition Follow instructions from your health care provider about what to eat or drink to help manage your health condition. These instructions may include: Reducing your daily calorie intake. Limiting how much salt (sodium) you use to 1,500 milligrams (mg) each day. Using only healthy fats for cooking, such as olive oil, canola oil, or sunflower oil. Eating healthy foods. You can do this by: Choosing foods that are high in fiber, such as whole grains, and fresh fruits and vegetables. Eating at least 5 servings of fruits and vegetables a day. Try to fill one-half of your plate with fruits and vegetables at each meal. Choosing lean protein foods, such as lean cuts of meat, poultry without skin, fish, tofu, beans, and nuts. Eating low-fat dairy products.  Avoiding foods that are high in sodium. This can help lower blood pressure. Avoiding foods that have saturated fat, trans fat, and cholesterol. This can help prevent high cholesterol. Avoiding processed and prepared foods. Counting your daily carbohydrate intake.  Lifestyle If you drink alcohol: Limit how much you have to: 0-1 drink a day for women who are not pregnant. 0-2 drinks a day for men. Know how much alcohol is in your drink. In the [place] one drink equals one 12 oz bottle of beer ( ), one 5 oz glass of wine ( ), or one 1 oz glass of hard liquor (44mL). Do not use any products that contain nicotine or tobacco. These products include cigarettes, chewing tobacco, and vaping devices, such as e-cigarettes. If you need help quitting, ask your health care provider. Avoid secondhand smoke. Do not use drugs. Activity  Try to stay at a healthy weight. Get at least 30 minutes of exercise on most days, such as: Fast  walking. Biking. Swimming. Medicines Take over-the-counter and prescription medicines only as told by your health care provider. Aspirin or blood thinners (antiplatelets or anticoagulants) may be recommended to reduce your risk of forming blood clots that can lead to stroke. Avoid taking birth control pills. Talk to your health care provider about the risks of taking birth control pills if: You are over 25 years old. You smoke. You get very bad headaches. You have had a blood clot. Where to find more information American Stroke Association: www.strokeassociation.org Get help right away if: You or a loved one has any symptoms of a stroke. "BE FAST" is an easy way to remember the main warning signs of a stroke: B - Balance. Signs are dizziness, sudden trouble walking, or loss of balance. E - Eyes. Signs are trouble seeing or a sudden change in vision. F - Face. Signs are sudden weakness or numbness of the face, or the face or eyelid drooping on one side. A - Arms. Signs are weakness or numbness in an arm. This happens suddenly and usually on one side of the body. S - Speech. Signs are sudden trouble speaking, slurred speech, or trouble understanding what people say. T - Time. Time to call emergency services. Write down what time symptoms started. You or a loved one has other signs of a stroke, such as: A sudden, severe headache with no known cause. Nausea or vomiting. Seizure. These symptoms may represent a serious problem that is an emergency. Do not wait to see if the symptoms will go away. Get medical help right away. Call your local emergency services (911 in the [place] Do not drive yourself to the hospital. Summary You can help to prevent a stroke by eating healthy, exercising, not smoking, limiting alcohol intake, and managing any medical conditions you may have. Do not use any products that contain nicotine or tobacco. These include cigarettes, chewing tobacco, and vaping devices,  such as e-cigarettes. If you need help quitting, ask your health care provider. Remember "BE FAST" for warning signs of a stroke. Get help right away if you or a loved one has any of these signs. This information is not intended to replace advice given to you by your health care provider. Make sure you discuss any questions you have with your health care provider. Document Revised: 01/16/2022 Document Reviewed: 01/16/2022 Elsevier Patient Education  2024 [employer].

## 2022-12-23 NOTE — Progress Notes (Signed)
Results sent through MyChart

## 2022-12-25 ENCOUNTER — Telehealth: Payer: Self-pay | Admitting: Medical

## 2022-12-25 DIAGNOSIS — G459 Transient cerebral ischemic attack, unspecified: Secondary | ICD-10-CM

## 2022-12-25 NOTE — Telephone Encounter (Signed)
Left message for pt to call me back 

## 2022-12-25 NOTE — Telephone Encounter (Signed)
Pt took it easy over the weekend. She is really fatigue. She has taken her meds.  Her ultrasound is 11/1 but she has a mammogram and 10:15am and has to work at 12pm so she can't make it.   [place]- she says she works m-w-f from 12-5. so it needs to be early morning or tues or Thursday. Can we try to get her Ultrasound rescheduled

## 2022-12-25 NOTE — Telephone Encounter (Signed)
Call and see how she did over weekend?  Make sure she started the Toprol XL bp medication, aspirin and higher dose of crestor.  Have her call back if she doesn't hear back about echocardiogram or carotid ultrasound in the next 3 days.  I haven't heard back from Dr. Tenny Craw yet.

## 2022-12-26 NOTE — Telephone Encounter (Signed)
Reviewed events from last week Pt sched for carotid USN Would set up for echo and a 3 or 4 week live monitor   Dx  TIA

## 2022-12-27 NOTE — Addendum Note (Signed)
Addended by: Bertram Millard on: 12/27/2022 09:51 AM   Modules accepted: Orders

## 2022-12-27 NOTE — Telephone Encounter (Addendum)
Per Dr Tenny Craw... Echo and 4 week Event Monitor ordered... LM for the pt to call back.   Will send her a My Chart message.

## 2022-12-27 NOTE — Telephone Encounter (Signed)
Pt advised and verbalized understanding.

## 2022-12-29 ENCOUNTER — Ambulatory Visit (HOSPITAL_BASED_OUTPATIENT_CLINIC_OR_DEPARTMENT_OTHER): Payer: Medicare Other

## 2022-12-29 DIAGNOSIS — Z1231 Encounter for screening mammogram for malignant neoplasm of breast: Secondary | ICD-10-CM | POA: Diagnosis not present

## 2022-12-29 LAB — HM MAMMOGRAPHY

## 2023-01-01 ENCOUNTER — Telehealth: Payer: Self-pay | Admitting: Family Medicine

## 2023-01-01 NOTE — Telephone Encounter (Signed)
Left detailed message for pt to call [facility] imaging [phone] to reschedule imaging appt that she had to cancel

## 2023-01-01 NOTE — Telephone Encounter (Signed)
Pt has to cancel MRI at [facility] because she was not feeling well She told them she wanted to go somewhere closer, like [facility] but they told her that she had to call the office

## 2023-01-02 ENCOUNTER — Ambulatory Visit (HOSPITAL_BASED_OUTPATIENT_CLINIC_OR_DEPARTMENT_OTHER): Payer: Medicare Other

## 2023-01-03 ENCOUNTER — Encounter: Payer: Self-pay | Admitting: *Deleted

## 2023-01-07 DIAGNOSIS — G459 Transient cerebral ischemic attack, unspecified: Secondary | ICD-10-CM | POA: Diagnosis not present

## 2023-01-08 DIAGNOSIS — G459 Transient cerebral ischemic attack, unspecified: Secondary | ICD-10-CM | POA: Diagnosis not present

## 2023-01-10 ENCOUNTER — Telehealth: Payer: Self-pay

## 2023-01-10 ENCOUNTER — Ambulatory Visit
Admission: RE | Admit: 2023-01-10 | Discharge: 2023-01-10 | Disposition: A | Discharge status: Home / Self Care | Payer: Medicare Other | Source: Ambulatory Visit | Attending: Medical | Admitting: Medical

## 2023-01-10 DIAGNOSIS — G459 Transient cerebral ischemic attack, unspecified: Secondary | ICD-10-CM

## 2023-01-10 DIAGNOSIS — I10 Essential (primary) hypertension: Secondary | ICD-10-CM

## 2023-01-10 DIAGNOSIS — I6523 Occlusion and stenosis of bilateral carotid arteries: Secondary | ICD-10-CM | POA: Diagnosis not present

## 2023-01-10 DIAGNOSIS — E7800 Pure hypercholesterolemia, unspecified: Secondary | ICD-10-CM

## 2023-01-10 DIAGNOSIS — R200 Anesthesia of skin: Secondary | ICD-10-CM

## 2023-01-10 DIAGNOSIS — R7301 Impaired fasting glucose: Secondary | ICD-10-CM

## 2023-01-10 MED ORDER — HYDROCHLOROTHIAZIDE 12.5 MG PO CAPS: 12.5000 mg | ORAL_CAPSULE | Freq: Every day | ORAL | 3 refills | Status: DC

## 2023-01-10 NOTE — Telephone Encounter (Signed)
-----   Message from Dietrich Pates sent at 01/09/2023 12:54 PM EST ----- Reviewed test results  1.  Pt has CAD  and atherosclerosis of aorta 2  Pt has Rx for Crestor 20 mg that was ordered  by S Tysinger 3  Also given Rx for metoprolol and hydrochlorothiazide  12.5  mg  4.  Pt should have appt in 2 months for a.  BP check and b.   Lipid follow up 5  Pt to be sched for USN of carotid

## 2023-01-10 NOTE — Telephone Encounter (Signed)
Pt advised her CT results. Her med list updated and she had carotid today ordered by Dr Aleen Campi... pt to follow up 03/27/23.   Will forward to Dr Tenny Craw to review the Carotids when they come back and pt has Echo 02/01/23.

## 2023-01-15 NOTE — Progress Notes (Signed)
Results sent through MyChart

## 2023-01-16 ENCOUNTER — Telehealth: Payer: Self-pay

## 2023-01-16 DIAGNOSIS — Z09 Encounter for follow-up examination after completed treatment for conditions other than malignant neoplasm: Secondary | ICD-10-CM

## 2023-01-16 DIAGNOSIS — K7689 Other specified diseases of liver: Secondary | ICD-10-CM

## 2023-01-16 NOTE — Telephone Encounter (Signed)
Sent to the pts My Chart and MRI ordered.

## 2023-01-16 NOTE — Telephone Encounter (Signed)
Per Dr Tenny Craw:   THe noncardiac portion of CT showed ? Cysts on liver   Cannot define Radiology recomm MRI of abdomen to define further     Please schedule

## 2023-01-18 ENCOUNTER — Ambulatory Visit (HOSPITAL_COMMUNITY)
Admission: RE | Admit: 2023-01-18 | Discharge: 2023-01-18 | Disposition: A | Discharge status: Home / Self Care | Payer: Medicare Other | Source: Ambulatory Visit | Attending: Internal Medicine | Admitting: Internal Medicine

## 2023-01-18 ENCOUNTER — Ambulatory Visit (HOSPITAL_COMMUNITY): Admission: RE | Admit: 2023-01-18 | Payer: Medicare Other | Source: Ambulatory Visit

## 2023-01-18 DIAGNOSIS — K769 Liver disease, unspecified: Secondary | ICD-10-CM | POA: Diagnosis not present

## 2023-01-18 DIAGNOSIS — Z09 Encounter for follow-up examination after completed treatment for conditions other than malignant neoplasm: Secondary | ICD-10-CM | POA: Diagnosis not present

## 2023-01-18 DIAGNOSIS — I700 Atherosclerosis of aorta: Secondary | ICD-10-CM | POA: Diagnosis not present

## 2023-01-18 DIAGNOSIS — K7689 Other specified diseases of liver: Secondary | ICD-10-CM | POA: Diagnosis not present

## 2023-01-18 MED ORDER — GADOBUTROL 1 MMOL/ML IV SOLN
9.0000 mL | Freq: Once | INTRAVENOUS | Status: AC | PRN
  Administered 2023-01-18: 9 mL via INTRAVENOUS

## 2023-01-31 ENCOUNTER — Ambulatory Visit: Payer: Medicare Other | Attending: Internal Medicine

## 2023-01-31 DIAGNOSIS — G459 Transient cerebral ischemic attack, unspecified: Secondary | ICD-10-CM

## 2023-02-01 ENCOUNTER — Ambulatory Visit (HOSPITAL_COMMUNITY): Payer: Medicare Other | Attending: Internal Medicine

## 2023-02-01 DIAGNOSIS — G459 Transient cerebral ischemic attack, unspecified: Secondary | ICD-10-CM

## 2023-02-01 LAB — ECHOCARDIOGRAM COMPLETE
Area-P 1/2: 4.96 cm2
S' Lateral: 2.6 cm

## 2023-02-13 ENCOUNTER — Other Ambulatory Visit: Payer: Self-pay | Admitting: Family Medicine

## 2023-02-13 ENCOUNTER — Encounter: Payer: Self-pay | Admitting: Internal Medicine

## 2023-02-13 ENCOUNTER — Other Ambulatory Visit: Payer: Self-pay | Admitting: Medical

## 2023-02-13 DIAGNOSIS — B001 Herpesviral vesicular dermatitis: Secondary | ICD-10-CM

## 2023-02-13 NOTE — Telephone Encounter (Signed)
Last filled in June for #28, which would be enough for 7 outbreaks.  She has med check on Thursday.  If she currently has an outbreak and truly needs, ok to RF, otherwise will address at med check Thursday

## 2023-02-13 NOTE — Telephone Encounter (Signed)
Patient called back and spoke with [facility] and said she truly needs and confirmed for Thursday.

## 2023-02-13 NOTE — Telephone Encounter (Signed)
Left message to ask patient if she truly needs.

## 2023-02-13 NOTE — Telephone Encounter (Signed)
Is this okay to refill? 

## 2023-02-15 ENCOUNTER — Encounter: Payer: Medicare Other | Admitting: Family Medicine

## 2023-03-18 ENCOUNTER — Other Ambulatory Visit: Payer: Self-pay | Admitting: Family Medicine

## 2023-03-18 ENCOUNTER — Other Ambulatory Visit: Payer: Self-pay | Admitting: Medical

## 2023-03-18 DIAGNOSIS — Z8249 Family history of ischemic heart disease and other diseases of the circulatory system: Secondary | ICD-10-CM

## 2023-03-18 DIAGNOSIS — E7800 Pure hypercholesterolemia, unspecified: Secondary | ICD-10-CM

## 2023-03-25 NOTE — Progress Notes (Unsigned)
Cardiology Office Note:    Date:  03/27/2023   ID:  [name], [place] Sep 08, 1950, MRN [id number]  PCP:  Joselyn Arrow, MD  Cardiologist:  previously seen by Eloy End    History of Present Illness:    Lauren [name] is a 73 y.o. female with a hx of HL and  FHx of premature CAD (mother MI 22; 10 sibs, none living beyond 34, several died of MIs)   Myoview in 04-20-2014 was normal   I swa the pt  in Oct 2024     Pt set up for noncontrast chest CT   This showed coronary artery and aortic calcifications.  Pt placed on Crestor for lipid control   BP was high at that visit but she said she was stressed, that it was better at home  Since seen she denies CP  Breathing is good   No dizziness    She plans to get more active     Past Medical History:  Diagnosis Date   Arthritis    hips-Right worse and right knee.   COVID-19 05/2019   Facial basal cell cancer    nose (Dr. Nicholas Lose), leg   Herpes simplex labialis    Hyperlipidemia    Melanoma (HCC)    right shoulder- 10 yrs ago- no further issues   SCC (squamous cell carcinoma) 11/16/2016   left leg, 12/2018 RLE   Squamous cell skin cancer    many sites (hands/legs)    Past Surgical History:  Procedure Laterality Date   APPENDECTOMY     CARDIOVASCULAR STRESS TEST  06/2005   Dr. Clarene Duke   CESAREAN SECTION  x2   Chalazionectomy Right 02/2022   RLL, Dr. Nile Riggs   MOHS SURGERY Left 07/10/2017   LLE for SCC, Dr. Irene Limbo   REFRACTIVE SURGERY  age 25   REFRACTIVE SURGERY     TOTAL HIP ARTHROPLASTY Right 01/19/2015   Procedure: TOTAL RIGHT HIP ARTHROPLASTY ANTERIOR APPROACH;  Surgeon: Durene Romans, MD;  Location: [facility];  Service: Orthopedics;  Laterality: Right;    Current Medications: Current Meds  Medication Sig   aspirin EC 81 MG tablet Take 1 tablet (81 mg total) by mouth daily.   hydrochlorothiazide (MICROZIDE) 12.5 MG capsule Take 1 capsule (12.5 mg total) by mouth daily.   metoprolol succinate (TOPROL-XL) 25 MG 24 hr tablet TAKE 1  TABLET (25 MG TOTAL) BY MOUTH DAILY.   Multiple Vitamins-Minerals (MULTIVITAMIN ADULTS 50+ PO) Take 1 tablet by mouth daily at 6 (six) AM.   rosuvastatin (CRESTOR) 20 MG tablet Take 1 tablet (20 mg total) by mouth daily.   valACYclovir (VALTREX) 1000 MG tablet TAKE 2 TABLETS AT ONSET OF COLD SORE FOLLOWED BY SECOND DOSE 12 HOURS LATER. 4 TABS PER BREAKOUT     Allergies:   Morphine and codeine   Social History   Socioeconomic History   Marital status: Divorced    Spouse name: Not on file   Number of children: 2   Years of education: Not on file   Highest education level: Not on file  Occupational History   Occupation: used Quarry manager Building surveyor)    Employer: [employer]  Tobacco Use   Smoking status: Never   Smokeless tobacco: Never  Vaping Use   Vaping status: Never Used  Substance and Sexual Activity   Alcohol use: Yes    Comment: margarita 2/month or less   Drug use: No   Sexual activity: Not Currently    Partners: Male  Other Topics Concern   Not on file  Social History Narrative   Lives alone. Both sons live in [place]. 2 grandchildren, and 3 great-grandchildren who "practically live with her" (live down the street).  She is very active with them (taking them places, caring for them).   She retired 08/26/21.   Works part-time as a Engineer, structural (3 days/week)         Updated 07/2022   Social Drivers of Corporate investment banker Strain: Not on file  Food Insecurity: Not on file  Transportation Needs: Not on file  Physical Activity: Not on file  Stress: Not on file  Social Connections: Not on file     Family History: The patient's family history includes Alcohol abuse in her brother and father; Arthritis in her sister; Breast cancer (age of onset: 42) in her sister; COPD in her brother, brother, mother, and sister; Diabetes in her brother, sister, sister, and sister; Gout in her sister; Luiz Blare' disease in her sister; Heart disease in her brother, brother, sister, sister,  sister, and sister; Heart disease (age of onset: 39) in her son; Heart disease (age of onset: 12) in her mother; Hyperlipidemia in her mother, sister, and sister; Hypertension in her mother, sister, and sister; Lung cancer in her sister; Lupus in her sister; Stroke in her son; Stroke (age of onset: 62) in her father.  ROS:   Please see the history of present illness.    All other systems reviewed and are negative.  EKGs/Labs/Other Studies Reviewed:    The following studies were reviewed today:  EKG:  EKG is ordered today.  SR 89 bpm    Recent Labs: 08/17/2022: ALT 16; BUN 10; Creatinine, Ser 0.73; Hemoglobin 14.2; Platelets 367; Potassium 5.3; Sodium 141  Recent Lipid Panel    Component Value Date/Time   CHOL 209 (H) 08/17/2022 1233   TRIG 102 08/17/2022 1233   HDL 74 08/17/2022 1233   CHOLHDL 2.8 08/17/2022 1233   CHOLHDL 2.1 01/11/2017 1003   VLDL 31 (H) 01/06/2016 0855   LDLCALC 117 (H) 08/17/2022 1233   LDLCALC 100 (H) 01/11/2017 1003    Physical Exam:    VS:  BP 128/68   Pulse 86   Ht 4\' 10"  (1.473 m)   Wt 196 lb 12.8 oz (89.3 kg)   SpO2 98%   BMI 41.13 kg/m     Wt Readings from Last 3 Encounters:  03/27/23 196 lb 12.8 oz (89.3 kg)  12/22/22 194 lb 9.6 oz (88.3 kg)  12/07/22 196 lb 9.6 oz (89.2 kg)     GEN:  Well nourished, well developed in no acute distress HEENT: Normal NECK: No JVD; No carotid bruits CARDIAC: RRR, no murmurs RESPIRATORY:  Clear to auscultation ABDOMEN: Soft, non-tender, non-distended Ext  no LE edema  PLAN:     1   CAD  pt with coronary artery calcifications   She is not symptomatic   Rx risk factors  2  HL  Pt is currently on Crestor  Will check lipomed        3  HTN  BP is controlled     Labs:  CMET, Vit D, Lipomed, ApoB, Lpa    Medication Adjustments/Labs and Tests Ordered: Current medicines are reviewed at length with the patient today.  Concerns regarding medicines are outlined above.  Orders Placed This Encounter   Procedures   Comp Met (CMET)   NMR, lipoprofile   Apolipoprotein B   Lipoprotein A (LPA)   VITAMIN  D 25 Hydroxy (Vit-D Deficiency, Fractures)   No orders of the defined types were placed in this encounter.   Signed, Dietrich Pates, MD  03/27/2023 7:34 PM    [facility]

## 2023-03-27 ENCOUNTER — Ambulatory Visit: Payer: Medicare Other | Attending: Internal Medicine | Admitting: Internal Medicine

## 2023-03-27 ENCOUNTER — Encounter: Payer: Self-pay | Admitting: Internal Medicine

## 2023-03-27 VITALS — BP 128/68 | HR 86 | Ht <= 58 in | Wt 196.8 lb

## 2023-03-27 DIAGNOSIS — Z8249 Family history of ischemic heart disease and other diseases of the circulatory system: Secondary | ICD-10-CM | POA: Diagnosis not present

## 2023-03-27 DIAGNOSIS — Z09 Encounter for follow-up examination after completed treatment for conditions other than malignant neoplasm: Secondary | ICD-10-CM

## 2023-03-27 DIAGNOSIS — I10 Essential (primary) hypertension: Secondary | ICD-10-CM | POA: Diagnosis not present

## 2023-03-27 DIAGNOSIS — E7800 Pure hypercholesterolemia, unspecified: Secondary | ICD-10-CM

## 2023-03-27 DIAGNOSIS — R0602 Shortness of breath: Secondary | ICD-10-CM | POA: Diagnosis not present

## 2023-03-27 NOTE — Patient Instructions (Signed)
Medication Instructions:   *If you need a refill on your cardiac medications before your next appointment, please call your pharmacy*   Lab Work: NMR, APO B, LIPO A, CMET, VIT D  If you have labs (blood work) drawn today and your tests are completely normal, you will receive your results only by: MyChart Message (if you have MyChart) OR A paper copy in the mail If you have any lab test that is abnormal or we need to change your treatment, we will call you to review the results.   Testing/Procedures:    Follow-Up: At [facility], you and your health needs are our priority.  As part of our continuing mission to provide you with exceptional heart care, we have created designated Provider Care Teams.  These Care Teams include your primary Cardiologist (physician) and Advanced Practice Providers (APPs -  Physician Assistants and Nurse Practitioners) who all work together to provide you with the care you need, when you need it.  We recommend signing up for the patient portal called "MyChart".  Sign up information is provided on this After Visit Summary.  MyChart is used to connect with patients for Virtual Visits (Telemedicine).  Patients are able to view lab/test results, encounter notes, upcoming appointments, etc.  Non-urgent messages can be sent to your provider as well.   To learn more about what you can do with MyChart, go to [email or web address].    Your next appointment:  FALL 2025

## 2023-03-28 LAB — COMPREHENSIVE METABOLIC PANEL
ALT: 28 [IU]/L (ref 0–32)
AST: 15 [IU]/L (ref 0–40)
Albumin: 4.3 g/dL (ref 3.8–4.8)
Alkaline Phosphatase: 69 [IU]/L (ref 44–121)
BUN/Creatinine Ratio: 14 (ref 12–28)
BUN: 10 mg/dL (ref 8–27)
Bilirubin Total: 0.5 mg/dL (ref 0.0–1.2)
CO2: 26 mmol/L (ref 20–29)
Calcium: 9.8 mg/dL (ref 8.7–10.3)
Chloride: 99 mmol/L (ref 96–106)
Creatinine, Ser: 0.73 mg/dL (ref 0.57–1.00)
Globulin, Total: 2.3 g/dL (ref 1.5–4.5)
Glucose: 103 mg/dL — ABNORMAL HIGH (ref 70–99)
Potassium: 4.9 mmol/L (ref 3.5–5.2)
Sodium: 140 mmol/L (ref 134–144)
Total Protein: 6.6 g/dL (ref 6.0–8.5)
eGFR: 87 mL/min/{1.73_m2} (ref >59)

## 2023-03-28 LAB — NMR, LIPOPROFILE
Cholesterol, Total: 213 mg/dL — ABNORMAL HIGH (ref 100–199)
HDL Particle Number: 54.1 umol/L (ref >=30.5)
HDL-C: 81 mg/dL (ref >39)
LDL Particle Number: 1155 nmol/L — ABNORMAL HIGH (ref <1000)
LDL Size: 21 nmol (ref >20.5)
LDL-C (NIH Calc): 106 mg/dL — ABNORMAL HIGH (ref 0–99)
LP-IR Score: 50 — ABNORMAL HIGH (ref <=45)
Small LDL Particle Number: 289 nmol/L (ref <=527)
Triglycerides: 153 mg/dL — ABNORMAL HIGH (ref 0–149)

## 2023-03-28 LAB — VITAMIN D 25 HYDROXY (VIT D DEFICIENCY, FRACTURES): Vit D, 25-Hydroxy: 31.7 ng/mL (ref 30.0–100.0)

## 2023-03-28 LAB — LIPOPROTEIN A (LPA): Lipoprotein (a): 66.1 nmol/L (ref <75.0)

## 2023-03-28 LAB — APOLIPOPROTEIN B: Apolipoprotein B: 89 mg/dL (ref <90)

## 2023-04-11 ENCOUNTER — Other Ambulatory Visit: Payer: Self-pay

## 2023-04-11 DIAGNOSIS — I10 Essential (primary) hypertension: Secondary | ICD-10-CM

## 2023-04-11 DIAGNOSIS — E7800 Pure hypercholesterolemia, unspecified: Secondary | ICD-10-CM

## 2023-04-11 DIAGNOSIS — Z8249 Family history of ischemic heart disease and other diseases of the circulatory system: Secondary | ICD-10-CM

## 2023-04-11 MED ORDER — EZETIMIBE 10 MG PO TABS: 10.0000 mg | ORAL_TABLET | Freq: Every day | ORAL | 3 refills | Status: DC

## 2023-06-04 DIAGNOSIS — L570 Actinic keratosis: Secondary | ICD-10-CM | POA: Diagnosis not present

## 2023-06-04 DIAGNOSIS — D485 Neoplasm of uncertain behavior of skin: Secondary | ICD-10-CM | POA: Diagnosis not present

## 2023-06-04 DIAGNOSIS — I8391 Asymptomatic varicose veins of right lower extremity: Secondary | ICD-10-CM | POA: Diagnosis not present

## 2023-06-04 DIAGNOSIS — C44729 Squamous cell carcinoma of skin of left lower limb, including hip: Secondary | ICD-10-CM | POA: Diagnosis not present

## 2023-06-04 DIAGNOSIS — D0439 Carcinoma in situ of skin of other parts of face: Secondary | ICD-10-CM | POA: Diagnosis not present

## 2023-06-04 DIAGNOSIS — L820 Inflamed seborrheic keratosis: Secondary | ICD-10-CM | POA: Diagnosis not present

## 2023-06-06 DIAGNOSIS — M25562 Pain in left knee: Secondary | ICD-10-CM | POA: Diagnosis not present

## 2023-06-06 DIAGNOSIS — M7061 Trochanteric bursitis, right hip: Secondary | ICD-10-CM | POA: Diagnosis not present

## 2023-06-14 ENCOUNTER — Other Ambulatory Visit: Payer: Self-pay | Admitting: Family Medicine

## 2023-06-14 NOTE — Telephone Encounter (Signed)
 Pt has an appt 7/10

## 2023-06-15 ENCOUNTER — Other Ambulatory Visit: Payer: Self-pay | Admitting: Family Medicine

## 2023-06-15 DIAGNOSIS — E7800 Pure hypercholesterolemia, unspecified: Secondary | ICD-10-CM

## 2023-06-15 DIAGNOSIS — Z8249 Family history of ischemic heart disease and other diseases of the circulatory system: Secondary | ICD-10-CM

## 2023-07-16 ENCOUNTER — Telehealth: Payer: Self-pay | Admitting: Internal Medicine

## 2023-07-16 NOTE — Telephone Encounter (Signed)
 Copied from CRM [id number]. Topic: Clinical - Medical Advice >> Jul 16, 2023  9:49 AM Carlatta H wrote: Reason for CRM: Patient would like to speak with Nurse Grafton Lawrence in regards to colonoscopy//Please return call

## 2023-07-17 ENCOUNTER — Telehealth: Payer: Self-pay | Admitting: *Deleted

## 2023-07-17 NOTE — Telephone Encounter (Signed)
 Patient called and is due for colonoscopy (last was 07/28/2016-due in 7 years) per Dr. Tova Fresh. She does not want too see Dr. Tova Fresh because she charged her for a CPE last time in addition to colonoscopy. Asking for referral to [facility]. I do see where you noted in visit 08/17/22. Is it ok for referral?

## 2023-07-17 NOTE — Telephone Encounter (Signed)
 See other telephone call

## 2023-07-24 ENCOUNTER — Other Ambulatory Visit: Payer: Self-pay | Admitting: *Deleted

## 2023-07-24 DIAGNOSIS — Z1211 Encounter for screening for malignant neoplasm of colon: Secondary | ICD-10-CM

## 2023-07-24 DIAGNOSIS — L570 Actinic keratosis: Secondary | ICD-10-CM | POA: Diagnosis not present

## 2023-07-24 DIAGNOSIS — Z85828 Personal history of other malignant neoplasm of skin: Secondary | ICD-10-CM | POA: Diagnosis not present

## 2023-07-24 NOTE — Telephone Encounter (Signed)
 Patient advised.

## 2023-08-14 ENCOUNTER — Telehealth: Payer: Self-pay | Admitting: Gastroenterology

## 2023-08-14 NOTE — Telephone Encounter (Signed)
 Good afternoon Dr. Leonia Raman  Doc of Day PM   We received a call from this patient wishing to schedule a colonoscopy with our practice. Patient last had a colonoscopy in 07/2016 with Dr. Haidee Lev office and states she is due in June of this year for another one. Patient states she does not wish to proceed with Dr. Tova Fresh due to personal reasons and would like to schedule with our practice. Records were obtained and are in Epic for you to review. Would you please advise on scheduling?  Thank you.

## 2023-08-15 NOTE — Telephone Encounter (Signed)
 Okay to schedule direct colonoscopy, next available appointment.  If

## 2023-08-27 NOTE — Telephone Encounter (Signed)
 Called and left voicemail for patient to call back and schedule direct.

## 2023-09-03 ENCOUNTER — Ambulatory Visit: Payer: Medicare Other | Admitting: Family Medicine

## 2023-09-06 ENCOUNTER — Ambulatory Visit: Payer: Medicare Other | Admitting: Family Medicine

## 2023-09-14 ENCOUNTER — Other Ambulatory Visit: Payer: Self-pay | Admitting: Family Medicine

## 2023-09-14 NOTE — Telephone Encounter (Signed)
 Once scheduled we can refill her medication since she has cancelled multiple times already

## 2023-09-14 NOTE — Telephone Encounter (Signed)
 Melissa called pt. LM for pt. Needs to schedule MWV. She is past due for her yearly check up.

## 2023-10-11 ENCOUNTER — Encounter: Admitting: Family Medicine

## 2023-11-15 ENCOUNTER — Encounter: Admitting: Family Medicine

## 2023-11-28 ENCOUNTER — Other Ambulatory Visit: Payer: Self-pay | Admitting: Internal Medicine

## 2023-12-02 ENCOUNTER — Other Ambulatory Visit: Payer: Self-pay | Admitting: Medical

## 2023-12-11 ENCOUNTER — Telehealth: Payer: Self-pay | Admitting: *Deleted

## 2023-12-11 ENCOUNTER — Other Ambulatory Visit: Payer: Self-pay | Admitting: Medical

## 2023-12-11 NOTE — Telephone Encounter (Signed)
 Last physical with me was 07/2022--r/s to Jan would be 6 months past due. In reviewing chart, her cardiologist added Zetia  in 02/2023, and she was supposed to have lipids rechecked in 8-12 weeks, but she never did this. They wrote her for a year supply of the zetia . This has slipped through the cracks.  She needs to schedule a med check with me, sooner than the physical that is scheduled in January--we prescribe her rosuvastatin  (which was written for a year, in 11/2022, so will be expiring), and her blood pressure meds. I haven't seen her since 11/2022. She needs a visit scheduled (and can come fasting for the visit, rather than us  doing them prior).

## 2023-12-11 NOTE — Telephone Encounter (Signed)
 Copied from CRM [id number]. Topic: Clinical - Request for Lab/Test Order >> Dec 11, 2023  4:34 PM Wess RAMAN wrote: Reason for CRM: Patient needs lab orders placed for annual physical on 03/21/23  She canceled appt for Thursday and r/s'd with E2C[facility] and is asking for lab orders prior to appt.

## 2023-12-12 NOTE — Telephone Encounter (Signed)
 Pt has scheduled CPE for 12/13/23   She has rescheduled multiple times

## 2023-12-12 NOTE — Patient Instructions (Incomplete)
  HEALTH MAINTENANCE RECOMMENDATIONS:  It is recommended that you get at least 30 minutes of aerobic exercise at least 5 days/week (for weight loss, you may need as much as 60-90 minutes). This can be any activity that gets your heart rate up. This can be divided in 10-15 minute intervals if needed, but try and build up your endurance at least once a week.  Weight bearing exercise is also recommended twice weekly.  Eat a healthy diet with lots of vegetables, fruits and fiber.  Colorful foods have a lot of vitamins (ie green vegetables, tomatoes, red peppers, etc).  Limit sweet tea, regular sodas and alcoholic beverages, all of which has a lot of calories and sugar.  Up to 1 alcoholic drink daily may be beneficial for women (unless trying to lose weight, watch sugars).  Drink a lot of water.  Calcium  recommendations are 1200-1500 mg daily (1500 mg for postmenopausal women or women without ovaries), and vitamin D  1000 IU daily.  This should be obtained from diet and/or supplements (vitamins), and calcium  should not be taken all at once, but in divided doses.  Monthly self breast exams and yearly mammograms for women over the age of 25 is recommended.  Sunscreen of at least SPF 30 should be used on all sun-exposed parts of the skin when outside between the hours of 10 am and 4 pm (not just when at beach or pool, but even with exercise, golf, tennis, and yard work!)  Use a sunscreen that says broad spectrum so it covers both UVA and UVB rays, and make sure to reapply every 1-2 hours.  Remember to change the batteries in your smoke detectors when changing your clock times in the spring and fall. Carbon monoxide detectors are recommended for your home.  Use your seat belt every time you are in a car, and please drive safely and not be distracted with cell phones and texting while driving.   Ms. Deschepper , Thank you for taking time to come for your Medicare Wellness Visit. I appreciate your ongoing  commitment to your health goals. Please review the following plan we discussed and let me know if I can assist you in the future.   This is a list of the screening recommended for you and due dates:  Health Maintenance  Topic Date Due   COVID-19 Vaccine (3 - Moderna risk series) 11/12/2019   Colon Cancer Screening  07/29/2023   Medicare Annual Wellness Visit  08/17/2023   Flu Shot  09/28/2023   Breast Cancer Screening  12/28/2024   DTaP/Tdap/Td vaccine (4 - Td or Tdap) 01/22/2033   Pneumococcal Vaccine for age over 16  Completed   DEXA scan (bone density measurement)  Completed   Hepatitis C Screening  Completed   Zoster (Shingles) Vaccine  Completed   Meningitis B Vaccine  Aged Out   RSV vaccine is recommended.  Please get this from the pharmacy  You are due for your colonoscopy (7 year follow-up recommended). Please contact [facility] (Dr. Shila) to schedule your colonoscopy.  We are checking your labs and will forward them to Dr. Okey.  You likely need to schedule a follow-up appointment with her (last seen 02/2023, and not seen since med changes were made for cholesterol).

## 2023-12-12 NOTE — Progress Notes (Signed)
 Chief Complaint  Patient presents with   Medicare Wellness    Fasting AWV/CPE (had 3 sips of coffee w/creamer) no pelvic is not having any issues. Declined coffee. Ran out MVI last week but is going to get more. Ran out of metoprolol  and cardiology would not refill. Letter was sent to schedule colonscopy, they told her to come here and pick up some paperwork (not sure of what). Did not go for hearing eval, about the same. Prefers to do DEXA now. I will fax order to [facility]. No new concerns.     Lauren [name] is a 73 y.o. female who presents for annual physical exam, Medicare wellness visit and follow-up on chronic medical conditions.    She retired 2 years ago.  The person she had been helping care for passed away. Her younger brother moved in with her recently, has been diagnosed with throat cancer. This is a little stressful, has lived by herself for 15 years. She watches her great grand-daughter (infant) Mon through Wednesday, for 11 hours on those days, plus is asked to babysit other times.  She remains involved with the rest of the family as well, who live nearby. She has some stress related to granddaughter's house being in foreclosure, not for the first time, a house that she had bought for her. She is angry/upset that she hasn't paid the water and HOA fees.  She was seen for an acute visit by me in 11/2022, for suspected venous stasis (discoloration and swelling in LE's).  Later in 11/2022 she presented to see [facility] with R facial and tongue numbness, feeling LH, elevated BP.  CT of head showed no acute findings, some chronic small vessel disease.  Shane suspected TIA.  ASA 81mg  was added, and Toprol  XL 25 mg was added to her hydrochlorothiazide .  Carotid US  in 12/2022 showed minimal bilateral atherosclerotic plaque, insignificant.  Echo 01/2023 had normal EF, mild LVH, Grade II diastolic dysfunction.  Heart monitor 01/2023 revealed rare PACs and PVC's. CT of chest 11/2022 revealed CAD,  aortic atherosclerosis.  MRI was ordered to f/u on liver abnl, showing multiple cysts or hemangioma at L lobe of liver, and cardiomegaly.  Hypertension follow-up: She was started on HCTZ 12.5 mg in 09/2021, had taken it inconsistently related to SE (frequency, leakage), and BP's had improved so ultimately stopped it. When BP's went up, she was restarted on this, and it has been now prescribed by cardiology, Dr. Okey (recently refilled). She had toprol  XL 25 mg added by Ludie in 11/2022 as reported above. This was refilled for #90 in April, none since. She hasn't taken it since it ran out in July. She tries to follow low sodium diet. She monitors her BP at home, threw away the list she was keeping. She can't recall the numbers, just that they were all normal.    BP Readings from Last 3 Encounters:  12/13/23 122/72  03/27/23 128/68  12/22/22 (!) 154/70   She denies headaches, dizziness, edema. No recurrent facial numbness. No chest pain, palpitations, shortness of breath. No muscle cramps or spasms (sometimes if she stretches in the morning, toes might cramp).   Hyperlipidemia/family history of CAD: She saw Dr. Nelson/cardiology--originally sent for cardiac clearance prior to hip replacement.  She had a normal stress test, and was started on Crestor  by Dr. Maranda. Dose was increased to 20 mg when she had the possible TIA 11/2022.  Zetia  was added to rosuvastatin  by Dr. Okey in 03/2023. She was supposed to  schedule f/u labs in 8-12 weeks, but never did. She is compliant with taking zetia  and 20mg  crestor  (confirmed with pill colors, wasn't sure what she was taking when reviewed meds with nurse).  She eats very few eggs (2/month), infrequent ice cream, doesn't eat cheese. She has red meat once or twice a month (mostly in spaghetti sauce or lasagna). Rarely eats pork chops. She is due for recheck.  She had a few sips of creamer in her coffee this morning.  Last lipids (NMR profile) 02/2023, ordered  by Dr. Okey:  Component Ref Range & Units (hover) 8 mo ago 1 yr ago 2 yr ago 3 yr ago 4 yr ago 5 yr ago 6 yr ago  LDL Particle Number [id number] High         Comment:                           Low                   < 1000                           Moderate         1000 - 1299                           Borderline-High  1300 - 1599                           High             1600 - 2000                           Very High             > 2000  LDL-C (NIH Calc) 106 High         Comment:                           Optimal               <  100                           Above optimal     100 -  129                           Borderline        130 -  159                           High              160 -  189                           Very high             >  189  HDL-C 81        Triglycerides 153 High  102 135 180 High  133 107 72 R  Cholesterol, Total 213 High         HDL Particle Number [id number]  Small LDL Particle Number [id number]        LDL Size 21.0          Pre-diabetes/elevated fasting glucose:  A1c was 5.8 in 07/2022. Component Ref Range & Units (hover) 8 mo ago 1 yr ago 2 yr ago 3 yr ago 4 yr ago 5 yr ago 6 yr ago  Glucose 103 High  97 103 High  99 R 96 R 107 High  R 74 R, CM   She tries to limit sweets. She occasionally bakes cakes, has a slice.  Had a piece of pineapple upside down cake she mae last night. She cut back on potatoes, eats sweet potatoes more often instead. She uses honey wheat bread Exercise is limited due to hip pain (has pain after walking 15 minutes).  H/o R trochanteric bursitis.  She is under the care of Dr. Ernie, last seen in 05/2023 for injections of R trochanteric bursa and L knee injections. She is having pain at right lateral hip when she walks more than 15 minutes.   Herpes Labialis--Uses Valtrex  prn.  They tend to be triggered by the sun (and stress). Last refilled in 01/2023.  Immunization History  Administered Date(s) Administered   Fluad Quad(high Dose 65+)  11/10/2020, 11/09/2021   Fluad Trivalent(High Dose 65+) 12/06/2022   INFLUENZA, HIGH DOSE SEASONAL PF 10/29/2014, 12/23/2015, 01/11/2017, 12/10/2017   Influenza Split 11/24/2010   Influenza, Quadrivalent, Recombinant, Inj, Pf 01/09/2019   Influenza,inj,Quad PF,6+ Mos 10/27/2013   Moderna Sars-Covid-2 Vaccination 09/17/2019, 10/15/2019   PNEUMOCOCCAL CONJUGATE-20 08/17/2022   Pneumococcal Conjugate-13 10/29/2014   Pneumococcal Polysaccharide-23 10/05/2008, 01/06/2016   Td 02/28/2000   Tdap 10/05/2008, 01/23/2023   Zoster Recombinant(Shingrix) 10/04/2017, 12/17/2017, 10/12/2021   Zoster, Live 11/24/2010   Last Pap smear: 12/2016, no high risk HPV detected Last mammogram: 12/2022 Last colonoscopy: 07/2016 (scattered diverticula, Dr. Kristie).  7 year f/u was recommended, due now. Last DEXA: 08/2018 T-0.9 [facility])  Had been T-1.7 at L fem neck in 2016. Dentist: twice yearly Ophtho: >1 year ago Exercise:  Lifting up great-granddaughter she helps care for. Not much cardio--hip bothers her if she walks more than 15 minutes.   Vitamin D -OH 31.7 in 02/2023   Patient Care Team: Randol Dawes, MD as PCP - General (Family Medicine) Dentist: [facility] [place] Dermatologist: Dr. Bonney Podiatrist: Dr. Magdalen Cardiologist: Dr. Okey GI: Switched from Dr. Kristie to Dr. Patrisha, hasn't seen yet Ortho: Dr. Ernie (hip) Ophtho: Dr. Roz  Depression Screening: Flowsheet Row Office Visit from 12/13/2023 in [facility]  PHQ-2 Total Score 0    Falls screen:     12/13/2023   10:40 AM 08/17/2022   11:17 AM 11/09/2021   11:13 AM 08/15/2021    8:37 AM 11/10/2020    9:56 AM  Fall Risk   Falls in the past year? 0 0 1 1 0  Number falls in past yr: 0 0 1 1 0  Comment   5/23-tripped at car lot and hurt L knee (torn meniscus) 6/23 -slipped at friends house and hurt L shoulder 07/14/21 and 07/17/21   Injury with Fall? 0 0 1 1 0  Comment    5/18/23fell at car lot, missed a  step and fell on cement (hurt left knee)bruising 07/17/21 hurt L knee and L shoulder (torn meniscus-did not need surgery   Risk for fall due to : No Fall Risks No Fall Risks History of fall(s) History of fall(s) No Fall Risks  Follow up Falls evaluation completed Falls  evaluation completed Falls evaluation completed  Falls evaluation completed  Falls evaluation completed      Data saved with a previous flowsheet row definition     Functional Status Survey: Is the patient deaf or have difficulty hearing?: Yes (trouble hearing) Does the patient have difficulty seeing, even when wearing glasses/contacts?: No Does the patient have difficulty concentrating, remembering, or making decisions?: No Does the patient have difficulty walking or climbing stairs?: No Does the patient have difficulty dressing or bathing?: No Does the patient have difficulty doing errands alone such as visiting a doctor's office or shopping?: No  Mini-Cog Scoring: 5    End of Life Discussion:  Patient has a living will and medical power of attorney, scanned in chart.   PMH, PSH, SH and FH were reviewed and updated.  Outpatient Encounter Medications as of 12/13/2023  Medication Sig Note   aspirin  EC 81 MG tablet Take 1 tablet (81 mg total) by mouth daily.    ezetimibe  (ZETIA ) 10 MG tablet Take 1 tablet (10 mg total) by mouth daily.    hydrochlorothiazide  (MICROZIDE ) 12.5 MG capsule TAKE 1 CAPSULE BY MOUTH EVERY DAY    rosuvastatin  (CRESTOR ) 20 MG tablet Take 1 tablet (20 mg total) by mouth daily.    metoprolol  succinate (TOPROL -XL) 25 MG 24 hr tablet TAKE 1 TABLET (25 MG TOTAL) BY MOUTH DAILY. (Patient not taking: Reported on 12/13/2023)    Multiple Vitamins-Minerals (MULTIVITAMIN ADULTS 50+ PO) Take 1 tablet by mouth daily at 6 (six) AM. (Patient not taking: Reported on 12/13/2023) 12/13/2023: Ran out last week, will get more   valACYclovir  (VALTREX ) 1000 MG tablet TAKE 2 TABLETS AT ONSET OF COLD SORE FOLLOWED BY SECOND  DOSE 12 HOURS LATER. 4 TABS PER BREAKOUT    No facility-administered encounter medications on file as of 12/13/2023.   Allergies  Allergen Reactions   Morphine And Codeine     Patient prefers not to receivedbecame addictive to this many years ago- Demerol  is okay    ROS: The patient denies anorexia, fever, headaches, vision changes, ear pain, sore throat, breast concerns, chest pain, palpitations, dizziness, syncope, dyspnea on exertion, cough, swelling, nausea, vomiting, diarrhea, constipation, abdominal pain, melena, hematochezia, indigestion/heartburn, hematuria, dysuria, vaginal bleeding, discharge, odor or itch, genital lesions, numbness, tingling, weakness, tremor, suspicious skin lesions, depression, anxiety, abnormal bleeding/bruising, or enlarged lymph nodes.    Some leakage of urine with sneeze/cough. No dysuria R hip pain and L knee pain with walking after 10-15 mins (improves after injections, last was 6 mos ago). Some trouble sleeping, stays up later, no change from last year.  Sleeps 4-5 hours, no naps. Has caffeine with dinner. Some decreased hearing. Stress related to family, per HPI.     PHYSICAL EXAM:  BP 122/72   Pulse 72   Ht 4' 11 (1.499 m)   Wt 191 lb 12.8 oz (87 kg)   BMI 38.74 kg/m   Wt Readings from Last 3 Encounters:  12/13/23 191 lb 12.8 oz (87 kg)  03/27/23 196 lb 12.8 oz (89.3 kg)  12/22/22 194 lb 9.6 oz (88.3 kg)    General Appearance:   Alert, cooperative, no distress, appears stated age. Some decrease in hearing noted  Head:   Normocephalic, without obvious abnormality, atraumatic    Eyes:   PERRL, EOM's intact, fundi benign.    Ears:   Normal TM's and external ear canals    Nose:   Normal, no drainage, no sinus tenderness   Throat:   Normal mucosa,  no lesions    Neck:   Supple, no lymphadenopathy; thyroid : no enlargement/ tenderness/nodules; no carotid bruit or JVD    Back:   Spine nontender, no curvature, ROM normal, no CVA tenderness     Lungs:   Clear to auscultation bilaterally without wheezes, rales or ronchi; respirations unlabored    Chest Wall:   No tenderness or deformity    Heart:   Regular rate and rhythm, S1 and S2 normal, no murmur, rub or gallop    Breast Exam:   No tenderness, masses, or nipple discharge or inversion. No axillary lymphadenopathy.   Abdomen:   Soft, non-tender, nondistended, normoactive bowel sounds, no masses, no hepatosplenomegaly. WHSS   Genitalia:   Not examined, patient declined exam  Rectal:   Not examined, patient declined exam  Extremities:   No clubbing, cyanosis or edema.  Mild bunion deformities bilaterally, nontender.  Pulses:   2+ and symmetric all extremities    Skin:   Skin color, texture, turgor normal, no lesions.  Lymph nodes:  Cervical, supraclavicular nodes normal    Neurologic:   Normal strength, sensation and gait; reflexes 2+ and symmetric throughout                 Psych:   Normal mood, intermittently upset/irritable in discussing family stress. Full range of affect, normal hygiene and grooming   Lab Results  Component Value Date   HGBA1C 5.9 (A) 12/13/2023     ASSESSMENT/PLAN:  Annual physical exam  Medicare annual wellness visit, subsequent  Impaired fasting glucose - reviewed proper diet, encouraged daily exercise, stress-reduction, weight loss - Plan: HgB A1c  Essential hypertension - well controlled on HCTZ alone, no need for resuming BB. Reviewed low Na diet, need for daily exercise, wt loss, stress reduction, limit-setting - Plan: Comprehensive metabolic panel  Pure hypercholesterolemia - past due for recheck of NMR as rec by Dr. Okey after Zetia  added to crestor  in 03/2023. Reviewed low cholesterol diet - Plan: NMR, lipoprofile  Herpes labialis - cont valtrex  prn - Plan: valACYclovir  (VALTREX ) 1000 MG tablet  Osteopenia of neck of left femur - noted in 2016 (T-1.7).  DEXA was improved in 2020. Encouraged proper Ca, D, weight-bearing exercise.  Stress  at home - related to brother's cancer, and long hours providing childcare and additional babysitting, granddaughter's poor $ decisions. Counseled  Need for influenza vaccination - Plan: Flu vaccine HIGH DOSE PF(Fluzone Trivalent)  Medication monitoring encounter - Plan: Comprehensive metabolic panel, CBC with Differential  Family history of early CAD   Await lipid results to refill rosuvastatin .  Counseled re: stress reduction, limit-setting, the need to have time to properly care for herself, learning how to set limits and say no. We discussed the importance of sleep, and to cut back on her caffeine intake in order to get to bed earlier. Strongly encouraged her to consider counseling.   Discussed monthly self breast exams and yearly mammograms; at least 30 minutes of aerobic activity at least 5 days/week, weight-bearing exercise 2x/week; proper sunscreen use reviewed; healthy diet, including goals of calcium  and vitamin D  intake and alcohol recommendations (less than or equal to 1 drink/day) reviewed; regular seatbelt use; changing batteries in smoke detectors. Immunization recommendations discussed--continue yearly high dose flu shots, given today. COVID booster recommended, declined. RSV vaccine is recommended, to get from the pharmacy. Colonoscopy recommendations reviewed, 7 yr f/u recommended, last 07/2016. Was due in 07/2023. Reminded to contact Dr. Trenna office to schedule. Pap smears are no longer indicated based  on age. Hearing evaluation is recommended. Reminded to schedule routine eye exam.  Will forward labs to cardiology, will need to schedule f/u with Dr. Okey.  MOST form reviewed/signed, no changes.  Full Code, Full care.  I spent 75 minutes dedicated to the care of this patient, including pre-visit review of records, face to face time, post-visit ordering of testing and documentation. Significant time spent outside of wellness visit (at least 40 mins)--spent counseling  re: stress, inadequate sleep, HTN, lipids, low Na diet, weight loss. Discussed non-weight-bearing exercises she can do while awaiting ortho visit to treat her knee and hip pain.  Counseled re: ways to get weight-bearing exercise, Ca and D for preventing further bone loss.   Medicare Attestation I have personally reviewed: The patient's medical and social history Their use of alcohol, tobacco or illicit drugs Their current medications and supplements The patient's functional ability including ADLs,fall risks, home safety risks, cognitive, and hearing and visual impairment Diet and physical activities Evidence for depression or mood disorders  The patient's weight, height, BMI have been recorded in the chart.  I have made referrals, counseling, and provided education to the patient based on review of the above and I have provided the patient with a written personalized care plan for preventive services.

## 2023-12-13 ENCOUNTER — Encounter: Admitting: Family Medicine

## 2023-12-13 ENCOUNTER — Encounter: Payer: Self-pay | Admitting: Family Medicine

## 2023-12-13 ENCOUNTER — Ambulatory Visit: Admitting: Family Medicine

## 2023-12-13 VITALS — BP 122/72 | HR 72 | Ht 59.0 in | Wt 191.8 lb

## 2023-12-13 DIAGNOSIS — M25551 Pain in right hip: Secondary | ICD-10-CM

## 2023-12-13 DIAGNOSIS — R7301 Impaired fasting glucose: Secondary | ICD-10-CM | POA: Diagnosis not present

## 2023-12-13 DIAGNOSIS — Z0000 Encounter for general adult medical examination without abnormal findings: Secondary | ICD-10-CM

## 2023-12-13 DIAGNOSIS — M85852 Other specified disorders of bone density and structure, left thigh: Secondary | ICD-10-CM

## 2023-12-13 DIAGNOSIS — Z8249 Family history of ischemic heart disease and other diseases of the circulatory system: Secondary | ICD-10-CM | POA: Diagnosis not present

## 2023-12-13 DIAGNOSIS — Z8673 Personal history of transient ischemic attack (TIA), and cerebral infarction without residual deficits: Secondary | ICD-10-CM

## 2023-12-13 DIAGNOSIS — F439 Reaction to severe stress, unspecified: Secondary | ICD-10-CM

## 2023-12-13 DIAGNOSIS — Z5181 Encounter for therapeutic drug level monitoring: Secondary | ICD-10-CM | POA: Diagnosis not present

## 2023-12-13 DIAGNOSIS — E7800 Pure hypercholesterolemia, unspecified: Secondary | ICD-10-CM | POA: Diagnosis not present

## 2023-12-13 DIAGNOSIS — B001 Herpesviral vesicular dermatitis: Secondary | ICD-10-CM

## 2023-12-13 DIAGNOSIS — I10 Essential (primary) hypertension: Secondary | ICD-10-CM | POA: Diagnosis not present

## 2023-12-13 DIAGNOSIS — Z23 Encounter for immunization: Secondary | ICD-10-CM | POA: Diagnosis not present

## 2023-12-13 LAB — POCT GLYCOSYLATED HEMOGLOBIN (HGB A1C): Hemoglobin A1C: 5.9 % — AB (ref 4.0–5.6)

## 2023-12-13 MED ORDER — VALACYCLOVIR HCL 1 G PO TABS: ORAL_TABLET | ORAL | 0 refills | Status: AC

## 2023-12-14 ENCOUNTER — Ambulatory Visit: Payer: Self-pay | Admitting: Family Medicine

## 2023-12-14 LAB — CBC WITH DIFFERENTIAL/PLATELET
Basophils Absolute: 0 x10E3/uL (ref 0.0–0.2)
Basos: 1 %
EOS (ABSOLUTE): 0.1 x10E3/uL (ref 0.0–0.4)
Eos: 1 %
Hematocrit: 45.1 % (ref 34.0–46.6)
Hemoglobin: 14.3 g/dL (ref 11.1–15.9)
Immature Grans (Abs): 0 x10E3/uL (ref 0.0–0.1)
Immature Granulocytes: 0 %
Lymphocytes Absolute: 1.4 x10E3/uL (ref 0.7–3.1)
Lymphs: 23 %
MCH: 29.2 pg (ref 26.6–33.0)
MCHC: 31.7 g/dL (ref 31.5–35.7)
MCV: 92 fL (ref 79–97)
Monocytes Absolute: 0.4 x10E3/uL (ref 0.1–0.9)
Monocytes: 7 %
Neutrophils Absolute: 4.1 x10E3/uL (ref 1.4–7.0)
Neutrophils: 68 %
Platelets: 307 x10E3/uL (ref 150–450)
RBC: 4.89 x10E6/uL (ref 3.77–5.28)
RDW: 12.9 % (ref 11.7–15.4)
WBC: 6.1 x10E3/uL (ref 3.4–10.8)

## 2023-12-14 LAB — COMPREHENSIVE METABOLIC PANEL WITH GFR
ALT: 21 IU/L (ref 0–32)
AST: 14 IU/L (ref 0–40)
Albumin: 4.4 g/dL (ref 3.8–4.8)
Alkaline Phosphatase: 60 IU/L (ref 49–135)
BUN/Creatinine Ratio: 18 (ref 12–28)
BUN: 14 mg/dL (ref 8–27)
Bilirubin Total: 0.4 mg/dL (ref 0.0–1.2)
CO2: 24 mmol/L (ref 20–29)
Calcium: 9.9 mg/dL (ref 8.7–10.3)
Chloride: 100 mmol/L (ref 96–106)
Creatinine, Ser: 0.79 mg/dL (ref 0.57–1.00)
Globulin, Total: 2.6 g/dL (ref 1.5–4.5)
Glucose: 99 mg/dL (ref 70–99)
Potassium: 4.4 mmol/L (ref 3.5–5.2)
Sodium: 140 mmol/L (ref 134–144)
Total Protein: 7 g/dL (ref 6.0–8.5)
eGFR: 78 mL/min/1.73 (ref >59)

## 2023-12-14 LAB — NMR, LIPOPROFILE
Cholesterol, Total: 149 mg/dL (ref 100–199)
HDL Particle Number: 49.1 umol/L (ref >=30.5)
HDL-C: 76 mg/dL (ref >39)
LDL Particle Number: 620 nmol/L (ref <1000)
LDL Size: 20.2 nm — ABNORMAL LOW (ref >20.5)
LDL-C (NIH Calc): 54 mg/dL (ref 0–99)
LP-IR Score: 42 (ref <=45)
Small LDL Particle Number: 345 nmol/L (ref <=527)
Triglycerides: 110 mg/dL (ref 0–149)

## 2023-12-14 LAB — SPECIMEN STATUS REPORT

## 2023-12-14 MED ORDER — ROSUVASTATIN CALCIUM 20 MG PO TABS: 20.0000 mg | ORAL_TABLET | Freq: Every day | ORAL | 3 refills | Status: AC

## 2024-03-06 LAB — HM MAMMOGRAPHY

## 2024-03-06 LAB — HM DEXA SCAN: HM Dexa Scan: NORMAL

## 2024-03-12 ENCOUNTER — Ambulatory Visit: Payer: Self-pay | Admitting: Family Medicine

## 2024-03-13 ENCOUNTER — Other Ambulatory Visit

## 2024-03-20 ENCOUNTER — Encounter: Admitting: Family Medicine

## 2024-03-26 ENCOUNTER — Other Ambulatory Visit: Payer: Self-pay | Admitting: Internal Medicine

## 2024-03-28 NOTE — Telephone Encounter (Signed)
 Medication refilled 30 day supply   Upcoming appt 04/24/24

## 2024-04-24 ENCOUNTER — Ambulatory Visit: Admitting: Internal Medicine

## 2025-01-08 ENCOUNTER — Encounter: Payer: Self-pay | Admitting: Family Medicine
